# Patient Record
Sex: Female | Born: 1935
Health system: Southern US, Community
[De-identification: ages and names within clinical notes are randomized; demographics above are authoritative.]

## PROBLEM LIST (undated history)

## (undated) DIAGNOSIS — M810 Age-related osteoporosis without current pathological fracture: Secondary | ICD-10-CM

## (undated) DIAGNOSIS — M1990 Unspecified osteoarthritis, unspecified site: Secondary | ICD-10-CM

## (undated) DIAGNOSIS — R569 Unspecified convulsions: Secondary | ICD-10-CM

## (undated) DIAGNOSIS — H3530 Unspecified macular degeneration: Secondary | ICD-10-CM

## (undated) DIAGNOSIS — M329 Systemic lupus erythematosus, unspecified: Secondary | ICD-10-CM

## (undated) DIAGNOSIS — F329 Major depressive disorder, single episode, unspecified: Secondary | ICD-10-CM

## (undated) DIAGNOSIS — I10 Essential (primary) hypertension: Secondary | ICD-10-CM

## (undated) DIAGNOSIS — F32A Depression, unspecified: Secondary | ICD-10-CM

## (undated) DIAGNOSIS — E785 Hyperlipidemia, unspecified: Secondary | ICD-10-CM

## (undated) DIAGNOSIS — G4700 Insomnia, unspecified: Secondary | ICD-10-CM

## (undated) DIAGNOSIS — IMO0002 Reserved for concepts with insufficient information to code with codable children: Secondary | ICD-10-CM

## (undated) DIAGNOSIS — K219 Gastro-esophageal reflux disease without esophagitis: Secondary | ICD-10-CM

## (undated) DIAGNOSIS — R51 Headache: Secondary | ICD-10-CM

## (undated) DIAGNOSIS — Z973 Presence of spectacles and contact lenses: Secondary | ICD-10-CM

## (undated) DIAGNOSIS — F419 Anxiety disorder, unspecified: Secondary | ICD-10-CM

## (undated) DIAGNOSIS — I2699 Other pulmonary embolism without acute cor pulmonale: Secondary | ICD-10-CM

## (undated) HISTORY — DX: Unspecified osteoarthritis, unspecified site: M19.90

## (undated) HISTORY — DX: Hyperlipidemia, unspecified: E78.5

## (undated) HISTORY — DX: Systemic lupus erythematosus, unspecified: M32.9

## (undated) HISTORY — PX: EYE SURGERY: SHX253

## (undated) HISTORY — PX: OTHER SURGICAL HISTORY: SHX169

## (undated) HISTORY — DX: Age-related osteoporosis without current pathological fracture: M81.0

## (undated) HISTORY — DX: Reserved for concepts with insufficient information to code with codable children: IMO0002

## (undated) HISTORY — PX: DILITATION & CURRETTAGE/HYSTROSCOPY WITH ESSURE: SHX5573

## (undated) HISTORY — DX: Unspecified macular degeneration: H35.30

## (undated) HISTORY — DX: Headache: R51

## (undated) HISTORY — DX: Gastro-esophageal reflux disease without esophagitis: K21.9

## (undated) HISTORY — DX: Other pulmonary embolism without acute cor pulmonale: I26.99

## (undated) HISTORY — PX: TONSILLECTOMY: SUR1361

## (undated) HISTORY — DX: Essential (primary) hypertension: I10

## (undated) HISTORY — DX: Unspecified convulsions: R56.9

## (undated) HISTORY — PX: COLONOSCOPY: SHX174

## (undated) HISTORY — PX: APPENDECTOMY: SHX54

---

## 1998-09-06 HISTORY — PX: ANKLE FRACTURE SURGERY: SHX122

## 2006-09-06 DIAGNOSIS — I2699 Other pulmonary embolism without acute cor pulmonale: Secondary | ICD-10-CM

## 2006-09-06 HISTORY — PX: CARDIAC CATHETERIZATION: SHX172

## 2006-09-06 HISTORY — DX: Other pulmonary embolism without acute cor pulmonale: I26.99

## 2009-09-06 HISTORY — PX: WRIST SURGERY: SHX841

## 2012-02-05 DIAGNOSIS — M1990 Unspecified osteoarthritis, unspecified site: Secondary | ICD-10-CM

## 2012-02-05 DIAGNOSIS — H3530 Unspecified macular degeneration: Secondary | ICD-10-CM

## 2012-02-05 HISTORY — DX: Unspecified osteoarthritis, unspecified site: M19.90

## 2012-02-05 HISTORY — DX: Unspecified macular degeneration: H35.30

## 2012-02-15 ENCOUNTER — Ambulatory Visit (INDEPENDENT_AMBULATORY_CARE_PROVIDER_SITE_OTHER): Payer: Self-pay | Admitting: Psychiatry

## 2012-02-15 ENCOUNTER — Encounter (HOSPITAL_COMMUNITY): Payer: Self-pay | Admitting: Psychiatry

## 2012-02-15 VITALS — Wt 187.0 lb

## 2012-02-15 DIAGNOSIS — F329 Major depressive disorder, single episode, unspecified: Secondary | ICD-10-CM

## 2012-02-15 DIAGNOSIS — F3289 Other specified depressive episodes: Secondary | ICD-10-CM

## 2012-02-15 DIAGNOSIS — Z79899 Other long term (current) drug therapy: Secondary | ICD-10-CM

## 2012-02-15 LAB — CBC WITH DIFFERENTIAL/PLATELET
Basophils Relative: 0 % (ref 0–1)
Eosinophils Absolute: 0 10*3/uL (ref 0.0–0.7)
Eosinophils Relative: 0 % (ref 0–5)
HCT: 38.7 % (ref 36.0–46.0)
Hemoglobin: 13.3 g/dL (ref 12.0–15.0)
MCH: 29.6 pg (ref 26.0–34.0)
MCHC: 34.4 g/dL (ref 30.0–36.0)
MCV: 86 fL (ref 78.0–100.0)
Monocytes Absolute: 0.5 10*3/uL (ref 0.1–1.0)
Monocytes Relative: 7 % (ref 3–12)

## 2012-02-15 LAB — HEMOGLOBIN A1C: Mean Plasma Glucose: 117 mg/dL — ABNORMAL HIGH (ref <117)

## 2012-02-15 MED ORDER — DIAZEPAM 2 MG PO TABS: 2.0000 mg | ORAL_TABLET | Freq: Two times a day (BID) | ORAL | Status: DC | PRN

## 2012-02-15 MED ORDER — CITALOPRAM HYDROBROMIDE 40 MG PO TABS: 40.0000 mg | ORAL_TABLET | Freq: Every day | ORAL | Status: DC

## 2012-02-15 MED ORDER — TRAZODONE HCL 150 MG PO TABS: 150.0000 mg | ORAL_TABLET | Freq: Every day | ORAL | Status: DC

## 2012-02-15 MED ORDER — ARIPIPRAZOLE 5 MG PO TABS: 5.0000 mg | ORAL_TABLET | Freq: Every day | ORAL | Status: DC

## 2012-02-15 MED ORDER — TOPIRAMATE 25 MG PO TABS: 25.0000 mg | ORAL_TABLET | Freq: Every day | ORAL | Status: DC
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
## 2012-02-16 LAB — COMPREHENSIVE METABOLIC PANEL
Albumin: 4.3 g/dL (ref 3.5–5.2)
Alkaline Phosphatase: 87 U/L (ref 39–117)
BUN: 12 mg/dL (ref 6–23)
Glucose, Bld: 99 mg/dL (ref 70–99)
Potassium: 3.9 mEq/L (ref 3.5–5.3)

## 2012-02-29 ENCOUNTER — Ambulatory Visit (INDEPENDENT_AMBULATORY_CARE_PROVIDER_SITE_OTHER): Payer: Medicare Other | Admitting: Psychiatry

## 2012-02-29 ENCOUNTER — Encounter (HOSPITAL_COMMUNITY): Payer: Self-pay | Admitting: Psychiatry

## 2012-02-29 DIAGNOSIS — F329 Major depressive disorder, single episode, unspecified: Secondary | ICD-10-CM
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
## 2012-03-02 NOTE — Patient Instructions (Signed)
Discussed orally 

## 2012-03-07 ENCOUNTER — Ambulatory Visit (HOSPITAL_COMMUNITY): Payer: Self-pay | Admitting: Psychiatry

## 2012-03-13 ENCOUNTER — Ambulatory Visit (HOSPITAL_COMMUNITY): Payer: Self-pay | Admitting: Psychiatry

## 2012-03-14 ENCOUNTER — Ambulatory Visit (INDEPENDENT_AMBULATORY_CARE_PROVIDER_SITE_OTHER): Payer: Medicare Other | Admitting: Psychiatry

## 2012-03-14 ENCOUNTER — Encounter (HOSPITAL_COMMUNITY): Payer: Self-pay | Admitting: Psychiatry

## 2012-03-14 VITALS — Wt 187.0 lb

## 2012-03-14 DIAGNOSIS — F329 Major depressive disorder, single episode, unspecified: Secondary | ICD-10-CM

## 2012-03-14 MED ORDER — DIAZEPAM 2 MG PO TABS: 2.0000 mg | ORAL_TABLET | Freq: Two times a day (BID) | ORAL | Status: DC | PRN

## 2012-03-14 MED ORDER — TOPIRAMATE 25 MG PO TABS: ORAL_TABLET | ORAL | Status: DC
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
## 2012-03-15 ENCOUNTER — Ambulatory Visit (INDEPENDENT_AMBULATORY_CARE_PROVIDER_SITE_OTHER): Payer: Medicare Other | Admitting: Psychiatry

## 2012-03-15 DIAGNOSIS — F329 Major depressive disorder, single episode, unspecified: Secondary | ICD-10-CM

## 2012-03-16 ENCOUNTER — Other Ambulatory Visit (HOSPITAL_COMMUNITY): Payer: Self-pay | Admitting: Psychiatry

## 2012-03-17 ENCOUNTER — Other Ambulatory Visit (HOSPITAL_COMMUNITY): Payer: Self-pay | Admitting: Psychiatry

## 2012-03-17 ENCOUNTER — Telehealth (HOSPITAL_COMMUNITY): Payer: Self-pay | Admitting: *Deleted

## 2012-03-17 ENCOUNTER — Other Ambulatory Visit (HOSPITAL_COMMUNITY): Payer: Self-pay | Admitting: *Deleted

## 2012-03-17 DIAGNOSIS — F329 Major depressive disorder, single episode, unspecified: Secondary | ICD-10-CM

## 2012-03-17 NOTE — Progress Notes (Signed)
Patient:  Grace Howard   DOB: 04-10-1936  MR Number: [id number]  Location: [facility]:  [address] [place] [place],  [place], [address]  Start: Wednesday 03/15/2012 2:05 PM End: Wednesday 03/15/2012 2:55 PM  Provider/Observer:     Florencia Reasons, MSW, LCSW   Chief Complaint:      Chief Complaint  Patient presents with  . Depression    Reason For Service:    The patient was referred for services by psychiatrist Dr. Lolly Mustache to improve coping skills. Patient has a long-standing history of symptoms of depression beginning in 1976. She is seeking services to establish care. Patient reports increased stressors for the past year with her symptoms worsening in the past few months. She reports moving from [place] in June 2012 after being there 17 years to reside with her son and his family in [place]. Patient reports she made a financial investment in assisting son and his wife in adding an apartment to their home for patient. However, within a month of patient's arrival, her son and daughter-in-law separated and began the process to obtain a divorce. Patient reports moving to [place], her hometown, in January 2013. Patient reports having a very close friend in [place] and knowing several acquaintances. However, she reports isolating herself being a home body and watching TV. She reports failing to contact and see people due to being self-conscious about weight gain. She states being skinny until she was 76 years old. She also reports avoiding contact due to dreading people asking about her family and states she does not want this to be the talk of the town. Patient also states being embarrassed about her social skills and says she feels she does not have much to offer. Patient reports stress related to needing and obtaining an attorney to handle legal issues to try to get a return of her financial investment as her son's and daughter-in-law's lawyers negotiate the assets in the divorce. Patient also  reports grief and loss issues regarding the loss of the relationship with her daughter-in-law with whom she had been very close and considered as a daughter. Patient reports additional stress related to her health as she has several medical issues. Patient is seen for follow up appointment.   Interventions Strategy:  Supportive therapy  Participation Level:   Active  Participation Quality:  Appropriate      Behavioral Observation:  Well Groomed, Alert, and Appropriate.   Current Psychosocial Factors: Ongoing concerns regarding her son going through divorce  Content of Session:   Establishing rapport, reviewing symptoms, processing feelings  Current Status:   The patient reports anxiety and sadness.  Patient Progress:   Fair. The patient reports little to no change in her symptoms since last session. She has decreased isolative behaviors. She has been socializing more with 2 of her friends. Patient still is very self-conscious about her weight gain and her family situation and tends to avoid acquaintances. She expresses increased worry about her son who is going through a divorce. Per patient's report, she has not heard from her son since Mother's Day. She reports asking him if the house had been sold. However, he told her that he couldn't talk about that  and that she had an attorney. She reports trying to avoid the subject for the remainder of the conversation. She has attempted to contact son several times since that time. Her son has not responded. She reports that his father who lives near he him had been meeting with their son  regularly as he seemed to be suffering from depression. Patient is concerned about her son's welfare.  Target Goals:   Establishing rapport  Last Reviewed:     Goals Addressed Today:    Establishing rapport   Impression/Diagnosis:   Patient presents with a long-standing history of depression with symptoms beginning in 1976. She has had 2 psychiatric  hospitalizations due to to depression. Patient's symptoms have worsened in recent months precipitated by a move, patient's health issues, and concerns regarding her family. Current symptoms include depressed mood, anxiety, agitation, sleep difficulty, memory difficulty, loss of interest, excessive worrying, low energy, and poor concentration. Diagnosis: Major depressive disorder, recurrent, moderate   Diagnosis:  Axis I:  1. Major depressive disorder             Axis II: No diagnosis

## 2012-03-17 NOTE — Telephone Encounter (Signed)
Given hard copy on 02/15/12 for 90 days.

## 2012-03-17 NOTE — Patient Instructions (Signed)
Discussed orally 

## 2012-03-19 ENCOUNTER — Other Ambulatory Visit (HOSPITAL_COMMUNITY): Payer: Self-pay | Admitting: Psychiatry

## 2012-03-20 ENCOUNTER — Telehealth (HOSPITAL_COMMUNITY): Payer: Self-pay | Admitting: *Deleted

## 2012-03-20 ENCOUNTER — Other Ambulatory Visit (HOSPITAL_COMMUNITY): Payer: Self-pay | Admitting: Psychiatry

## 2012-03-20 DIAGNOSIS — F329 Major depressive disorder, single episode, unspecified: Secondary | ICD-10-CM

## 2012-03-20 NOTE — Telephone Encounter (Signed)
Patient does not remember what she did with her prescription for Celexa 90 day supply that you gave her on 02/15/12. She has 10 days left. She said she does not have it, not sure if she ever mailed it, and prescription was never received by mail order pharmacy.  Asked if we could call in a RX for Celexa to [facility] in [place].

## 2012-03-21 ENCOUNTER — Telehealth (HOSPITAL_COMMUNITY): Payer: Self-pay | Admitting: *Deleted

## 2012-03-21 ENCOUNTER — Other Ambulatory Visit (HOSPITAL_COMMUNITY): Payer: Self-pay | Admitting: *Deleted

## 2012-03-21 DIAGNOSIS — F329 Major depressive disorder, single episode, unspecified: Secondary | ICD-10-CM

## 2012-03-21 MED ORDER — CITALOPRAM HYDROBROMIDE 40 MG PO TABS: 40.0000 mg | ORAL_TABLET | Freq: Every day | ORAL | Status: DC

## 2012-03-21 NOTE — Telephone Encounter (Signed)
Patient cannot find RX from 02/15/12 printed and given to her at office visit. Never sent to [facility]. Will run out of med before appt 04/27/12. 30 day supply authorized by Dr.Arfeen

## 2012-03-21 NOTE — Telephone Encounter (Signed)
See telephone call notes

## 2012-03-30 ENCOUNTER — Ambulatory Visit (HOSPITAL_COMMUNITY): Payer: Self-pay | Admitting: Psychiatry

## 2012-04-10 ENCOUNTER — Other Ambulatory Visit (HOSPITAL_COMMUNITY): Payer: Self-pay | Admitting: *Deleted

## 2012-04-10 DIAGNOSIS — F329 Major depressive disorder, single episode, unspecified: Secondary | ICD-10-CM

## 2012-04-11 ENCOUNTER — Other Ambulatory Visit (HOSPITAL_COMMUNITY): Payer: Self-pay | Admitting: Psychiatry

## 2012-04-11 DIAGNOSIS — F329 Major depressive disorder, single episode, unspecified: Secondary | ICD-10-CM

## 2012-04-11 MED ORDER — TOPIRAMATE 25 MG PO TABS: ORAL_TABLET | ORAL | Status: DC

## 2012-04-13 ENCOUNTER — Ambulatory Visit (HOSPITAL_COMMUNITY): Payer: Self-pay | Admitting: Psychiatry

## 2012-04-19 ENCOUNTER — Ambulatory Visit (INDEPENDENT_AMBULATORY_CARE_PROVIDER_SITE_OTHER): Payer: Medicare Other | Admitting: Psychiatry

## 2012-04-19 DIAGNOSIS — F329 Major depressive disorder, single episode, unspecified: Secondary | ICD-10-CM
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
## 2012-04-21 NOTE — Patient Instructions (Signed)
Discussed orally 

## 2012-04-27 ENCOUNTER — Ambulatory Visit (INDEPENDENT_AMBULATORY_CARE_PROVIDER_SITE_OTHER): Payer: Medicare Other | Admitting: Psychiatry

## 2012-04-27 ENCOUNTER — Encounter (HOSPITAL_COMMUNITY): Payer: Self-pay | Admitting: Psychiatry

## 2012-04-27 VITALS — Wt 188.0 lb

## 2012-04-27 DIAGNOSIS — F3289 Other specified depressive episodes: Secondary | ICD-10-CM

## 2012-04-27 DIAGNOSIS — F329 Major depressive disorder, single episode, unspecified: Secondary | ICD-10-CM

## 2012-04-27 MED ORDER — DIAZEPAM 2 MG PO TABS: 2.0000 mg | ORAL_TABLET | Freq: Two times a day (BID) | ORAL | Status: DC | PRN

## 2012-04-27 MED ORDER — TRAZODONE HCL 150 MG PO TABS: 150.0000 mg | ORAL_TABLET | Freq: Every day | ORAL | Status: DC

## 2012-04-27 MED ORDER — TOPIRAMATE 25 MG PO TABS: 25.0000 mg | ORAL_TABLET | Freq: Every day | ORAL | Status: DC

## 2012-04-27 MED ORDER — ARIPIPRAZOLE 5 MG PO TABS: 5.0000 mg | ORAL_TABLET | Freq: Every day | ORAL | Status: DC

## 2012-04-27 MED ORDER — CITALOPRAM HYDROBROMIDE 40 MG PO TABS: 40.0000 mg | ORAL_TABLET | Freq: Every day | ORAL | Status: DC
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
## 2012-05-03 ENCOUNTER — Ambulatory Visit (HOSPITAL_COMMUNITY): Payer: Self-pay | Admitting: Psychiatry

## 2012-05-12 ENCOUNTER — Ambulatory Visit (INDEPENDENT_AMBULATORY_CARE_PROVIDER_SITE_OTHER): Payer: Medicare Other | Admitting: Psychiatry

## 2012-05-12 DIAGNOSIS — F329 Major depressive disorder, single episode, unspecified: Secondary | ICD-10-CM

## 2012-05-16 NOTE — Progress Notes (Signed)
Patient:  Grace Howard   DOB: 12/30/1935  MR Number: [id number]  Location: [facility]:  [address] [place] [place],  [place], [address]  Start: Friday 05/12/2012 1:00 PM End: Friday 05/12/2012 1:55 PM  Provider/Observer:     Florencia Reasons, MSW, LCSW   Chief Complaint:      Chief Complaint  Patient presents with  . Depression    Reason For Service:    The patient was referred for services by psychiatrist Dr. Lolly Mustache to improve coping skills. Patient has a long-standing history of symptoms of depression beginning in 1976. She is seeking services to establish care. Patient reports increased stressors for the past year with her symptoms worsening in the past few months. She reports moving from [place] in June 2012 after being there 17 years to reside with her son and his family in [place]. Patient reports she made a financial investment in assisting son and his wife in adding an apartment to their home for patient. However, within a month of patient's arrival, her son and daughter-in-law separated and began the process to obtain a divorce. Patient reports moving to [place], her hometown, in January 2013. Patient reports having a very close friend in [place] and knowing several acquaintances. However, she reports isolating herself being a home body and watching TV. She reports failing to contact and see people due to being self-conscious about weight gain. She states being skinny until she was 76 years old. She also reports avoiding contact due to dreading people asking about her family and states she does not want this to be the talk of the town. Patient also states being embarrassed about her social skills and says she feels she does not have much to offer. Patient reports stress related to needing and obtaining an attorney to handle legal issues to try to get a return of her financial investment as her son's and daughter-in-law's lawyers negotiate the assets in the divorce. Patient also reports  grief and loss issues regarding the loss of the relationship with her daughter-in-law with whom she had been very close and considered as a daughter. Patient reports additional stress related to her health as she has several medical issues. Patient is seen for follow up appointment.   Interventions Strategy:  Supportive therapy  Participation Level:   Active  Participation Quality:  Appropriate      Behavioral Observation:  Well Groomed, Alert, and Appropriate.   Current Psychosocial Factors: Financial issues related to her son's divorce, health concerns  Content of Session:    reviewing symptoms, processing feelings, discussing patient's spirituality  Current Status:   The patient reports depressed mood along with continued anxiety and worry.  Patient Progress:   Fair. The patient reports feeling overwhelmed regarding financial issues related to her son's divorce as well as her health issues. Per patient's report, her son and daughter-in-law sold their home for considerably less than their original asking price resulting in a significant reduction in the amount of money available to reimburse patient for the addition she purchased for their home. Patient expresses sadness and disappointment as she had anticipated more money to make a down payment to purchase her own home. She also is concerned about the effects on her son. Patient also reports there is a large cyst on her uterus and expresses concern about having a transvaginal ultrasound in November. Patient has had increased thoughts about spirituality and reports fears of being alone and dying. She states wanting to go back to church but having  a fear of God and fear of what will happen to her after death. Therapist works with patient to explore possible resources in patient's community such as a Systems analyst that may be able to assist patient in answering her theological questions. She expresses reluctance due to a past  unhelpful response from a pastor when she was younger.  Target Goals:   Improving mood, reducing anxiety  Last Reviewed:     Goals Addressed Today:    Improving mood, reducing anxiety   Impression/Diagnosis:   Patient presents with a long-standing history of depression with symptoms beginning in 1976. She has had 2 psychiatric hospitalizations due to to depression. Patient's symptoms have worsened in recent months precipitated by a move, patient's health issues, and concerns regarding her family. Current symptoms include depressed mood, anxiety, agitation, sleep difficulty, memory difficulty, loss of interest, excessive worrying, low energy, and poor concentration. Diagnosis: Major depressive disorder, recurrent, moderate   Diagnosis:  Axis I:  1. Major depressive disorder             Axis II: No diagnosis

## 2012-05-16 NOTE — Patient Instructions (Signed)
Discussed orally 

## 2012-05-26 ENCOUNTER — Ambulatory Visit (INDEPENDENT_AMBULATORY_CARE_PROVIDER_SITE_OTHER): Payer: Medicare Other | Admitting: Psychiatry

## 2012-05-26 DIAGNOSIS — F329 Major depressive disorder, single episode, unspecified: Secondary | ICD-10-CM

## 2012-05-29 NOTE — Patient Instructions (Signed)
Discussed orally 

## 2012-05-29 NOTE — Progress Notes (Signed)
Patient:  Grace Howard   DOB: 10/06/1935  MR Number: [id number]  Location: [facility]:  [address] [place] [place],  [place], [address]  Start: Friday 05/26/2012 1:00 PM End: Friday 05/26/2012 1:50 PM  Provider/Observer:     Florencia Reasons, MSW, LCSW   Chief Complaint:      Chief Complaint  Patient presents with  . Depression    Reason For Service:    The patient was referred for services by psychiatrist Dr. Lolly Mustache to improve coping skills. Patient has a long-standing history of symptoms of depression beginning in 1976. She is seeking services to establish care. Patient reports increased stressors for the past year with her symptoms worsening in the past few months. She reports moving from [place] in June 2012 after being there 17 years to reside with her son and his family in [place]. Patient reports she made a financial investment in assisting son and his wife in adding an apartment to their home for patient. However, within a month of patient's arrival, her son and daughter-in-law separated and began the process to obtain a divorce. Patient reports moving to [place], her hometown, in January 2013. Patient reports having a very close friend in [place] and knowing several acquaintances. However, she reports isolating herself being a home body and watching TV. She reports failing to contact and see people due to being self-conscious about weight gain. She states being skinny until she was 76 years old. She also reports avoiding contact due to dreading people asking about her family and states she does not want this to be the talk of the town. Patient also states being embarrassed about her social skills and says she feels she does not have much to offer. Patient reports stress related to needing and obtaining an attorney to handle legal issues to try to get a return of her financial investment as her son's and daughter-in-law's lawyers negotiate the assets in the divorce. Patient also reports  grief and loss issues regarding the loss of the relationship with her daughter-in-law with whom she had been very close and considered as a daughter. Patient reports additional stress related to her health as she has several medical issues. Patient is seen for follow up appointment.   Interventions Strategy:  Supportive therapy  Participation Level:   Active  Participation Quality:  Appropriate      Behavioral Observation:  Well Groomed, Alert, and Appropriate.   Current Psychosocial Factors: Financial issues related to her son's divorce, health concerns  Content of Session:    reviewing symptoms, processing feelings, discussing patient's spirituality, reinforcing patient's efforts to set and maintain boundaries with her son  Current Status:   The patient reports less depressed mood but  continued anxiety and worry.  Patient Progress:   Fair. The patient reports increased stress as she has begun the process for applying for a loan to buy the town house where she currently is residing. She expresses frustrationthat her son has not returned her calls regarding her messages to reach a financial settlement regarding patient's reimbursement for the addition she purchased for his and his wife's home. However, patient has set boundaries by setting a deadline as to when she will involve her attorney. Patient has made progress and socializing more with diffuse alert friends but continues to experience anxiety about involvement with other people as well as attending church. She reports she has not yet contacted a Education officer, environmental or Librarian, academic. She still expresses a strong desire to go to church. Therapist  works with patient to explore small steps patient could possibly take to attend church.   Target Goals:   Improving mood, reducing anxiety  Last Reviewed:     Goals Addressed Today:    Improving mood, reducing anxiety   Impression/Diagnosis:   Patient presents with a long-standing history of  depression with symptoms beginning in 1976. She has had 2 psychiatric hospitalizations due to to depression. Patient's symptoms have worsened in recent months precipitated by a move, patient's health issues, and concerns regarding her family. Current symptoms include depressed mood, anxiety, agitation, sleep difficulty, memory difficulty, loss of interest, excessive worrying, low energy, and poor concentration. Diagnosis: Major depressive disorder, recurrent, moderate   Diagnosis:  Axis I:  1. Major depressive disorder             Axis II: No diagnosis

## 2012-06-09 ENCOUNTER — Ambulatory Visit (INDEPENDENT_AMBULATORY_CARE_PROVIDER_SITE_OTHER): Payer: Medicare Other | Admitting: Psychiatry

## 2012-06-09 DIAGNOSIS — F329 Major depressive disorder, single episode, unspecified: Secondary | ICD-10-CM

## 2012-06-09 NOTE — Patient Instructions (Signed)
Discussed orally 

## 2012-06-09 NOTE — Progress Notes (Addendum)
Patient:  Grace Howard   DOB: 1936/08/15  MR Number: [id number]  Location: [facility]:  [address] [place],  [place], [address]  Start: Friday 06/09/2012 3:05 PM End: Friday 06/09/2012 3:55 PM  Provider/Observer:     Florencia Reasons, MSW, LCSW   Chief Complaint:      Chief Complaint  Patient presents with  . Depression    Reason For Service:    The patient was referred for services by psychiatrist Dr. Lolly Mustache to improve coping skills. Patient has a long-standing history of symptoms of depression beginning in 1976. She is seeking services to establish care. Patient reports increased stressors for the past year with her symptoms worsening in the past few months. She reports moving from [place] in June 2012 after being there 17 years to reside with her son and his family in [place]. Patient reports she made a financial investment in assisting son and his wife in adding an apartment to their home for patient. However, within a month of patient's arrival, her son and daughter-in-law separated and began the process to obtain a divorce. Patient reports moving to [place], her hometown, in January 2013. Patient reports having a very close friend in [place] and knowing several acquaintances. However, she reports isolating herself being a home body and watching TV. She reports failing to contact and see people due to being self-conscious about weight gain. She states being skinny until she was 76 years old. She also reports avoiding contact due to dreading people asking about her family and states she does not want this to be the talk of the town. Patient also states being embarrassed about her social skills and says she feels she does not have much to offer. Patient reports stress related to needing and obtaining an attorney to handle legal issues to try to get a return of her financial investment as her son's and daughter-in-law's lawyers negotiate the assets in the divorce. Patient also reports  grief and loss issues regarding the loss of the relationship with her daughter-in-law with whom she had been very close and considered as a daughter. Patient reports additional stress related to her health as she has several medical issues. Patient is seen for follow up appointment.   Interventions Strategy:  Supportive therapy  Participation Level:   Active  Participation Quality:  Appropriate      Behavioral Observation:  Well Groomed, Alert, and Appropriate.   Current Psychosocial Factors:   Content of Session:    reviewing symptoms, processing feelings, reinforcing patient's efforts to increase socialization and involvement in activity  Current Status:   The patient reports improved mood, decreased worry, and decreased anxiety.  Patient Progress:   Good. The patient reports decreased stress regarding the loan application process as she has received positive feedback from potential mortgage lenders and is working with a Firefighter regarding details and best options. She also is pleased that she and her son talked recently and that the conversation was very pleasant. Patient is thankful that her son seems to be adjusting fairly well. She continues to increase her social involvement and reports going on a couple of outings including lunch with a few friends. She also is excited about going on an upcoming 2 day trip with another friend to see patient's former college roommate. Patient also is looking forward to attending upcoming shows in [place].  She expresses less anxiety regarding being involved with the group formed to participate in these outings which will include having dinner before  each show.  Target Goals:   Improving mood, reducing anxiety  Last Reviewed:     Goals Addressed Today:    Improving mood, reducing anxiety   Impression/Diagnosis:   Patient presents with a long-standing history of depression with symptoms beginning in 1976. She has had 2 psychiatric  hospitalizations due to to depression. Patient's symptoms have worsened in recent months precipitated by a move, patient's health issues, and concerns regarding her family. Current symptoms include depressed mood, anxiety, agitation, sleep difficulty, memory difficulty, loss of interest, excessive worrying, low energy, and poor concentration. Diagnosis: Major depressive disorder, recurrent, moderate   Diagnosis:  Axis I:  1. Major depressive disorder             Axis II: No diagnosis

## 2012-06-20 ENCOUNTER — Other Ambulatory Visit (HOSPITAL_COMMUNITY): Payer: Self-pay | Admitting: *Deleted

## 2012-06-20 DIAGNOSIS — F329 Major depressive disorder, single episode, unspecified: Secondary | ICD-10-CM

## 2012-06-21 ENCOUNTER — Other Ambulatory Visit (HOSPITAL_COMMUNITY): Payer: Self-pay | Admitting: Psychiatry

## 2012-06-21 ENCOUNTER — Telehealth (HOSPITAL_COMMUNITY): Payer: Self-pay | Admitting: *Deleted

## 2012-06-22 ENCOUNTER — Other Ambulatory Visit (HOSPITAL_COMMUNITY): Payer: Self-pay | Admitting: Psychiatry

## 2012-06-22 DIAGNOSIS — F329 Major depressive disorder, single episode, unspecified: Secondary | ICD-10-CM

## 2012-06-22 MED ORDER — ARIPIPRAZOLE 2 MG PO TABS: ORAL_TABLET | ORAL | Status: DC

## 2012-06-27 ENCOUNTER — Encounter (HOSPITAL_COMMUNITY): Payer: Self-pay | Admitting: Psychiatry

## 2012-06-27 ENCOUNTER — Ambulatory Visit (INDEPENDENT_AMBULATORY_CARE_PROVIDER_SITE_OTHER): Payer: Medicare Other | Admitting: Psychiatry

## 2012-06-27 ENCOUNTER — Ambulatory Visit (HOSPITAL_COMMUNITY): Payer: Self-pay | Admitting: Psychiatry

## 2012-06-27 DIAGNOSIS — F329 Major depressive disorder, single episode, unspecified: Secondary | ICD-10-CM

## 2012-06-27 MED ORDER — CITALOPRAM HYDROBROMIDE 40 MG PO TABS: 40.0000 mg | ORAL_TABLET | Freq: Every day | ORAL | Status: DC

## 2012-06-27 MED ORDER — TRAZODONE HCL 150 MG PO TABS: 150.0000 mg | ORAL_TABLET | Freq: Every day | ORAL | Status: DC

## 2012-06-27 MED ORDER — TOPIRAMATE 25 MG PO TABS: 25.0000 mg | ORAL_TABLET | Freq: Every day | ORAL | Status: DC

## 2012-06-27 MED ORDER — DIAZEPAM 2 MG PO TABS: 2.0000 mg | ORAL_TABLET | Freq: Two times a day (BID) | ORAL | Status: DC | PRN
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
## 2012-06-30 ENCOUNTER — Ambulatory Visit (INDEPENDENT_AMBULATORY_CARE_PROVIDER_SITE_OTHER): Payer: Medicare Other | Admitting: Psychiatry

## 2012-06-30 DIAGNOSIS — F329 Major depressive disorder, single episode, unspecified: Secondary | ICD-10-CM

## 2012-06-30 NOTE — Progress Notes (Signed)
Patient:  Grace Howard   DOB: [birth year]  MR Number: [id number]  Location: Behavioral Health Center:  [address] [place] [place],  [place], [address]  Start: Friday 06/30/2012 2:00 PM End: Friday 06/30/2012 2:45 PM  Provider/Observer:     Florencia Reasons, MSW, LCSW   Chief Complaint:      Chief Complaint  Patient presents with  . Depression    Reason For Service:    The patient was referred for services by psychiatrist Dr. Lolly Mustache to improve coping skills. Patient has a long-standing history of symptoms of depression beginning in 1976. She is seeking services to establish care. Patient reports increased stressors for the past year with her symptoms worsening in the past few months. She reports moving from [place] in June 2012 after being there 17 years to reside with her son and his family in [place]. Patient reports she made a financial investment in assisting son and his wife in adding an apartment to their home for patient. However, within a month of patient's arrival, her son and daughter-in-law separated and began the process to obtain a divorce. Patient reports moving to [place], her hometown, in January 2013. Patient reports having a very close friend in [place] and knowing several acquaintances. However, she reports isolating herself being a home body and watching TV. She reports failing to contact and see people due to being self-conscious about weight gain. She states being skinny until she was 76 years old. She also reports avoiding contact due to dreading people asking about her family and states she does not want this to be the talk of the town. Patient also states being embarrassed about her social skills and says she feels she does not have much to offer. Patient reports stress related to needing and obtaining an attorney to handle legal issues to try to get a return of her financial investment as her son's and daughter-in-law's lawyers negotiate the assets in the divorce. Patient also  reports grief and loss issues regarding the loss of the relationship with her daughter-in-law with whom she had been very close and considered as a daughter. Patient reports additional stress related to her health as she has several medical issues. Patient is seen for follow up appointment.   Interventions Strategy:  Supportive therapy  Participation Level:   Active  Participation Quality:  Appropriate      Behavioral Observation:  Well Groomed, Alert, and Appropriate, pleasant  Current Psychosocial Factors:   Content of Session:    reviewing symptoms, processing feelings, discussing patient's progress, reinforcing patient's efforts to increase activity and social involvement  Current Status:   The patient reports improved mood, decreased worry, and decreased anxiety.  Patient Progress:   Good. The patient reports continued improved mood and increased activity and social involvement. She reports enjoying a recent trip to see her college roommate. She also is looking forward to attending upcoming shows in [place]. She expresses less worry about her loan application as well as less worry about her sons. Her statements in session reflect increased optimism and hope. She also expresses less anxiety about an upcoming medical procedure.  Target Goals:   Improving mood, reducing anxiety  Last Reviewed:     Goals Addressed Today:    Improving mood, reducing anxiety   Impression/Diagnosis:   Patient presents with a long-standing history of depression with symptoms beginning in 1976. She has had 2 psychiatric hospitalizations due to to depression. Patient's symptoms have worsened in recent months precipitated by a move, patient's health  issues, and concerns regarding her family. Current symptoms include depressed mood, anxiety, agitation, sleep difficulty, memory difficulty, loss of interest, excessive worrying, low energy, and poor concentration. Diagnosis: Major depressive disorder, recurrent,  moderate   Diagnosis:  Axis I:  1. Major depressive disorder             Axis II: No diagnosis

## 2012-06-30 NOTE — Patient Instructions (Signed)
Discussed orally 

## 2012-08-08 ENCOUNTER — Other Ambulatory Visit (HOSPITAL_COMMUNITY): Payer: Self-pay | Admitting: *Deleted

## 2012-08-08 DIAGNOSIS — F329 Major depressive disorder, single episode, unspecified: Secondary | ICD-10-CM

## 2012-08-08 MED ORDER — TOPIRAMATE 25 MG PO TABS: 25.0000 mg | ORAL_TABLET | Freq: Every day | ORAL | Status: DC

## 2012-08-08 MED ORDER — DIAZEPAM 2 MG PO TABS: 2.0000 mg | ORAL_TABLET | Freq: Two times a day (BID) | ORAL | Status: DC | PRN

## 2012-08-08 MED ORDER — CITALOPRAM HYDROBROMIDE 40 MG PO TABS: 40.0000 mg | ORAL_TABLET | Freq: Every day | ORAL | Status: DC

## 2012-08-08 MED ORDER — TRAZODONE HCL 150 MG PO TABS: 150.0000 mg | ORAL_TABLET | Freq: Every day | ORAL | Status: DC

## 2012-08-25 ENCOUNTER — Ambulatory Visit (HOSPITAL_COMMUNITY): Payer: Self-pay | Admitting: Psychiatry

## 2012-09-15 ENCOUNTER — Ambulatory Visit (HOSPITAL_COMMUNITY): Payer: Self-pay | Admitting: Psychiatry

## 2012-09-27 ENCOUNTER — Ambulatory Visit (HOSPITAL_COMMUNITY): Payer: Self-pay | Admitting: Psychiatry

## 2012-09-29 ENCOUNTER — Telehealth (HOSPITAL_COMMUNITY): Payer: Self-pay | Admitting: Psychiatry

## 2012-10-04 ENCOUNTER — Other Ambulatory Visit (HOSPITAL_COMMUNITY): Payer: Self-pay | Admitting: *Deleted

## 2012-10-04 DIAGNOSIS — F329 Major depressive disorder, single episode, unspecified: Secondary | ICD-10-CM

## 2012-10-04 NOTE — Telephone Encounter (Signed)
Refill request sent to Dr.Arfeen in Dr.Walker's absence

## 2012-10-05 MED ORDER — DIAZEPAM 2 MG PO TABS: 2.0000 mg | ORAL_TABLET | Freq: Two times a day (BID) | ORAL | Status: DC | PRN

## 2012-10-05 NOTE — Telephone Encounter (Signed)
Dr.Arfeen authorized a 10 day supply of Diazepam for pt in Dr.Walker's absence

## 2012-10-09 NOTE — Telephone Encounter (Signed)
Pt never seen by me, meds renewed until her appointment in 7 days.

## 2012-10-16 ENCOUNTER — Encounter (HOSPITAL_COMMUNITY): Payer: Self-pay | Admitting: Psychiatry

## 2012-10-16 ENCOUNTER — Ambulatory Visit (INDEPENDENT_AMBULATORY_CARE_PROVIDER_SITE_OTHER): Payer: Medicare Other | Admitting: Psychiatry

## 2012-10-16 VITALS — Wt 195.8 lb

## 2012-10-16 DIAGNOSIS — G894 Chronic pain syndrome: Secondary | ICD-10-CM | POA: Insufficient documentation

## 2012-10-16 DIAGNOSIS — F329 Major depressive disorder, single episode, unspecified: Secondary | ICD-10-CM

## 2012-10-16 DIAGNOSIS — E559 Vitamin D deficiency, unspecified: Secondary | ICD-10-CM

## 2012-10-16 MED ORDER — DIAZEPAM 2 MG PO TABS: 2.0000 mg | ORAL_TABLET | Freq: Two times a day (BID) | ORAL | Status: DC | PRN

## 2012-10-16 MED ORDER — GABAPENTIN 100 MG PO CAPS: 100.0000 mg | ORAL_CAPSULE | Freq: Three times a day (TID) | ORAL | Status: DC

## 2012-10-16 MED ORDER — ARIPIPRAZOLE 2 MG PO TABS: ORAL_TABLET | ORAL | Status: DC

## 2012-10-16 MED ORDER — LIDOCAINE 5 % EX PTCH: 1.0000 | MEDICATED_PATCH | Freq: Two times a day (BID) | CUTANEOUS | Status: DC
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
## 2012-10-16 NOTE — Patient Instructions (Addendum)
Cut the Trazodone in 1/2 for about a week and then stop.  Stop the Topamax cut in half for 2 or 3 days.  Try shifting to Neurontin in place of the Valium.  Masy take up to 3 of the Neurontin at a time to help with pain and anxiety.  The Celexa is half as strong as before.  Relaxation is the ultimate solution for you.  You can seek it through tub baths, bubble baths, essential oils or incense, walking or chatting with friends, listening to soft music, watching a candle burn and just letting all thoughts go and appreciating the true essence of the Creator.   Call if problems or concerns.

## 2012-10-17 ENCOUNTER — Telehealth (HOSPITAL_COMMUNITY): Payer: Self-pay | Admitting: Psychiatry

## 2012-10-17 LAB — VITAMIN D 25 HYDROXY (VIT D DEFICIENCY, FRACTURES): Vit D, 25-Hydroxy: 46 ng/mL (ref 30–89)

## 2012-10-17 NOTE — Telephone Encounter (Signed)
Will await the decision. S/W pt and verified that the Voltaren did not work too well.

## 2012-10-21 ENCOUNTER — Other Ambulatory Visit: Payer: Self-pay

## 2012-10-23 ENCOUNTER — Telehealth (HOSPITAL_COMMUNITY): Payer: Self-pay | Admitting: Psychiatry

## 2012-10-23 NOTE — Telephone Encounter (Signed)
Lidoderm patch authorized form 10-16-2012 to 10-19-2013

## 2012-10-26 ENCOUNTER — Telehealth (HOSPITAL_COMMUNITY): Payer: Self-pay | Admitting: Psychiatry

## 2012-11-08 NOTE — Telephone Encounter (Signed)
Phone message completed in the phone message section.  

## 2012-11-13 ENCOUNTER — Ambulatory Visit (HOSPITAL_COMMUNITY): Payer: Self-pay | Admitting: Psychiatry

## 2012-11-13 ENCOUNTER — Telehealth (HOSPITAL_COMMUNITY): Payer: Self-pay | Admitting: Psychiatry

## 2012-11-13 DIAGNOSIS — F329 Major depressive disorder, single episode, unspecified: Secondary | ICD-10-CM

## 2012-11-13 MED ORDER — DIAZEPAM 2 MG PO TABS: 2.0000 mg | ORAL_TABLET | Freq: Two times a day (BID) | ORAL | Status: DC | PRN

## 2012-11-13 NOTE — Telephone Encounter (Signed)
Phone message completed in the phone message section.  

## 2012-11-29 ENCOUNTER — Ambulatory Visit (HOSPITAL_COMMUNITY): Payer: Self-pay | Admitting: Psychiatry

## 2012-12-06 ENCOUNTER — Ambulatory Visit (INDEPENDENT_AMBULATORY_CARE_PROVIDER_SITE_OTHER): Payer: Medicare Other | Admitting: Psychiatry

## 2012-12-06 ENCOUNTER — Encounter (HOSPITAL_COMMUNITY): Payer: Self-pay | Admitting: Psychiatry

## 2012-12-06 ENCOUNTER — Telehealth (HOSPITAL_COMMUNITY): Payer: Self-pay | Admitting: Psychiatry

## 2012-12-06 VITALS — Wt 199.9 lb

## 2012-12-06 DIAGNOSIS — G894 Chronic pain syndrome: Secondary | ICD-10-CM

## 2012-12-06 DIAGNOSIS — F329 Major depressive disorder, single episode, unspecified: Secondary | ICD-10-CM

## 2012-12-06 DIAGNOSIS — F5105 Insomnia due to other mental disorder: Secondary | ICD-10-CM

## 2012-12-06 DIAGNOSIS — E559 Vitamin D deficiency, unspecified: Secondary | ICD-10-CM

## 2012-12-06 DIAGNOSIS — F418 Other specified anxiety disorders: Secondary | ICD-10-CM | POA: Insufficient documentation

## 2012-12-06 MED ORDER — CITALOPRAM HYDROBROMIDE 40 MG PO TABS: 20.0000 mg | ORAL_TABLET | Freq: Every day | ORAL | Status: DC

## 2012-12-06 MED ORDER — ARIPIPRAZOLE 5 MG PO TABS: ORAL_TABLET | ORAL | Status: DC

## 2012-12-06 MED ORDER — TRAZODONE HCL 150 MG PO TABS: 75.0000 mg | ORAL_TABLET | Freq: Every day | ORAL | Status: DC

## 2012-12-06 MED ORDER — CITALOPRAM HYDROBROMIDE 40 MG PO TABS: 40.0000 mg | ORAL_TABLET | Freq: Every day | ORAL | Status: DC

## 2012-12-06 MED ORDER — DIAZEPAM 2 MG PO TABS: 2.0000 mg | ORAL_TABLET | Freq: Two times a day (BID) | ORAL | Status: DC | PRN

## 2012-12-06 MED ORDER — GABAPENTIN 100 MG PO CAPS: 100.0000 mg | ORAL_CAPSULE | Freq: Three times a day (TID) | ORAL | Status: DC
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
## 2012-12-06 NOTE — Telephone Encounter (Signed)
Pt wondered if she should keep on the 20 mg of Celexa.  That seems to be working better.  Will have her do that.

## 2012-12-06 NOTE — Patient Instructions (Signed)
Set a timer for 8 minutes and walk for that amount of time in the house or in the yard.  Mark "8" on a calendar for that day.  Do that every day this week.  Then next week increase the time to 9 minutes and then mark the calendar with a 9 for that day.  Each week increase your exercise by one minute.  Keep a record of this so you can see what progress you are making.  Do this every day, just like eating and sleeping.  It is good for pain control, depression, and for your soul/spirit.  Bring the record in for your next visit so we can talk about your effort and how you feel with the new exercise program going and working for you.  Relaxation is the ultimate solution for you.  You can seek it through tub baths, bubble baths, essential oils or incense, walking or chatting with friends, listening to soft music, watching a candle burn and just letting all thoughts go and appreciating the true essence of the Creator.  Pets or animals may be very helpful.  You might spend some time with them and then go do more directed meditation.  Call if problems or concerns.

## 2012-12-20 ENCOUNTER — Telehealth (HOSPITAL_COMMUNITY): Payer: Self-pay | Admitting: Psychiatry

## 2012-12-20 DIAGNOSIS — F329 Major depressive disorder, single episode, unspecified: Secondary | ICD-10-CM

## 2012-12-20 MED ORDER — ARIPIPRAZOLE 5 MG PO TABS: ORAL_TABLET | ORAL | Status: DC

## 2012-12-20 NOTE — Telephone Encounter (Signed)
Resent script to [facility]

## 2013-01-03 ENCOUNTER — Encounter (HOSPITAL_COMMUNITY): Payer: Self-pay | Admitting: Psychiatry

## 2013-01-03 ENCOUNTER — Ambulatory Visit (INDEPENDENT_AMBULATORY_CARE_PROVIDER_SITE_OTHER): Payer: Medicare Other | Admitting: Psychiatry

## 2013-01-03 VITALS — BP 130/81 | HR 75 | Ht 65.25 in | Wt 199.6 lb

## 2013-01-03 DIAGNOSIS — E559 Vitamin D deficiency, unspecified: Secondary | ICD-10-CM

## 2013-01-03 DIAGNOSIS — F418 Other specified anxiety disorders: Secondary | ICD-10-CM

## 2013-01-03 DIAGNOSIS — F329 Major depressive disorder, single episode, unspecified: Secondary | ICD-10-CM

## 2013-01-03 DIAGNOSIS — G894 Chronic pain syndrome: Secondary | ICD-10-CM

## 2013-01-03 MED ORDER — ARIPIPRAZOLE 5 MG PO TABS: ORAL_TABLET | ORAL | Status: DC

## 2013-01-03 MED ORDER — DIAZEPAM 2 MG PO TABS: 2.0000 mg | ORAL_TABLET | Freq: Two times a day (BID) | ORAL | Status: DC | PRN

## 2013-01-03 MED ORDER — TRAZODONE HCL 150 MG PO TABS: 75.0000 mg | ORAL_TABLET | Freq: Every day | ORAL | Status: DC

## 2013-01-03 MED ORDER — GABAPENTIN 100 MG PO CAPS: 100.0000 mg | ORAL_CAPSULE | Freq: Three times a day (TID) | ORAL | Status: DC

## 2013-01-03 MED ORDER — CITALOPRAM HYDROBROMIDE 20 MG PO TABS: 20.0000 mg | ORAL_TABLET | Freq: Every day | ORAL | Status: DC
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
## 2013-01-03 NOTE — Patient Instructions (Signed)
Set a timer for 8 or a certain number minutes and walk or some exercise sitting down for that amount of time in the house or in the yard.  Mark the number of minutes on a calendar for that day.  Do that every day this week.  Then next week increase the time by 1 minutes and then mark the calendar with the number of minutes for that day.  Each week increase your exercise by one minute.  Keep a record of this so you can see the progress you are making.  Do this every day, just like eating and sleeping.  It is good for pain control, depression, and for your soul/spirit.  Bring the record in for your next visit so we can talk about your effort and how you feel with the new exercise program going and working for you.  Take care of yourself.  No one else is standing up to do the job and only you know what you need.   GET SERIOUS about taking care of yourself.  Do the next right thing and that often means doing something to care for yourself along the lines of are you hungry, are you angry, are you lonely, are you tired, are you scared?  [employer] is what that stands for.  Call if problems or concerns.

## 2013-01-24 ENCOUNTER — Telehealth (HOSPITAL_COMMUNITY): Payer: Self-pay | Admitting: Psychiatry

## 2013-01-24 MED ORDER — QUETIAPINE FUMARATE 25 MG PO TABS: 25.0000 mg | ORAL_TABLET | Freq: Two times a day (BID) | ORAL | Status: DC

## 2013-01-24 NOTE — Telephone Encounter (Signed)
Pt is in the donut hole with her Medicare coverage.  The abilify is $1100 for 90 day supply,  Insurance company suggests Seroquel in its stead.  Will order and see if that works.

## 2013-03-05 ENCOUNTER — Ambulatory Visit (HOSPITAL_COMMUNITY): Payer: Self-pay | Admitting: Psychiatry

## 2013-03-08 ENCOUNTER — Ambulatory Visit (HOSPITAL_COMMUNITY): Payer: Self-pay | Admitting: Psychiatry

## 2013-03-29 ENCOUNTER — Ambulatory Visit (INDEPENDENT_AMBULATORY_CARE_PROVIDER_SITE_OTHER): Payer: Medicare Other | Admitting: Psychiatry

## 2013-03-29 ENCOUNTER — Encounter (HOSPITAL_COMMUNITY): Payer: Self-pay | Admitting: Psychiatry

## 2013-03-29 VITALS — Wt 194.5 lb

## 2013-03-29 DIAGNOSIS — F5105 Insomnia due to other mental disorder: Secondary | ICD-10-CM

## 2013-03-29 DIAGNOSIS — F329 Major depressive disorder, single episode, unspecified: Secondary | ICD-10-CM

## 2013-03-29 MED ORDER — TRAZODONE HCL 150 MG PO TABS: 150.0000 mg | ORAL_TABLET | Freq: Every day | ORAL | Status: DC

## 2013-03-29 MED ORDER — CITALOPRAM HYDROBROMIDE 40 MG PO TABS: 40.0000 mg | ORAL_TABLET | Freq: Every day | ORAL | Status: DC

## 2013-03-29 MED ORDER — DIAZEPAM 2 MG PO TABS: 2.0000 mg | ORAL_TABLET | Freq: Every day | ORAL | Status: DC | PRN
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
## 2013-04-05 ENCOUNTER — Ambulatory Visit (HOSPITAL_COMMUNITY): Payer: Self-pay | Admitting: Psychiatry

## 2013-04-11 ENCOUNTER — Other Ambulatory Visit: Payer: Self-pay

## 2013-05-09 ENCOUNTER — Ambulatory Visit (INDEPENDENT_AMBULATORY_CARE_PROVIDER_SITE_OTHER): Payer: Medicare Other | Admitting: Psychiatry

## 2013-05-09 ENCOUNTER — Encounter (HOSPITAL_COMMUNITY): Payer: Self-pay | Admitting: Psychiatry

## 2013-05-09 VITALS — BP 150/80 | Ht 65.0 in | Wt 197.0 lb

## 2013-05-09 DIAGNOSIS — F489 Nonpsychotic mental disorder, unspecified: Secondary | ICD-10-CM

## 2013-05-09 DIAGNOSIS — F329 Major depressive disorder, single episode, unspecified: Secondary | ICD-10-CM

## 2013-05-09 DIAGNOSIS — F341 Dysthymic disorder: Secondary | ICD-10-CM

## 2013-05-09 DIAGNOSIS — F418 Other specified anxiety disorders: Secondary | ICD-10-CM

## 2013-05-09 MED ORDER — ARIPIPRAZOLE 5 MG PO TABS: ORAL_TABLET | ORAL | Status: DC

## 2013-05-09 MED ORDER — CITALOPRAM HYDROBROMIDE 40 MG PO TABS: 40.0000 mg | ORAL_TABLET | Freq: Every day | ORAL | Status: DC

## 2013-05-09 MED ORDER — TRAZODONE HCL 150 MG PO TABS: 150.0000 mg | ORAL_TABLET | Freq: Every day | ORAL | Status: DC

## 2013-05-09 MED ORDER — DIAZEPAM 2 MG PO TABS: 2.0000 mg | ORAL_TABLET | Freq: Three times a day (TID) | ORAL | Status: DC
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
## 2013-06-04 ENCOUNTER — Telehealth (HOSPITAL_COMMUNITY): Payer: Self-pay | Admitting: Psychiatry

## 2013-06-04 NOTE — Telephone Encounter (Signed)
Only have samples of 2 mg

## 2013-06-13 ENCOUNTER — Telehealth (HOSPITAL_COMMUNITY): Payer: Self-pay | Admitting: Psychiatry

## 2013-06-13 ENCOUNTER — Other Ambulatory Visit (HOSPITAL_COMMUNITY): Payer: Self-pay | Admitting: Psychiatry

## 2013-06-13 MED ORDER — TOPIRAMATE 25 MG PO TABS: 25.0000 mg | ORAL_TABLET | Freq: Every day | ORAL | Status: DC

## 2013-06-13 NOTE — Telephone Encounter (Signed)
done

## 2013-06-15 ENCOUNTER — Other Ambulatory Visit (HOSPITAL_COMMUNITY): Payer: Self-pay | Admitting: Psychiatry

## 2013-06-15 ENCOUNTER — Telehealth (HOSPITAL_COMMUNITY): Payer: Self-pay | Admitting: Psychiatry

## 2013-06-15 MED ORDER — TOPIRAMATE 25 MG PO TABS: 25.0000 mg | ORAL_TABLET | Freq: Every day | ORAL | Status: DC

## 2013-06-15 NOTE — Telephone Encounter (Signed)
Sent to [facility]

## 2013-06-29 ENCOUNTER — Telehealth (HOSPITAL_COMMUNITY): Payer: Self-pay | Admitting: *Deleted

## 2013-06-29 NOTE — Telephone Encounter (Signed)
Samples of abilify 5 mg left for pt

## 2013-07-02 ENCOUNTER — Ambulatory Visit (HOSPITAL_COMMUNITY): Payer: Self-pay | Admitting: Psychiatry

## 2013-07-03 ENCOUNTER — Telehealth (HOSPITAL_COMMUNITY): Payer: Self-pay | Admitting: *Deleted

## 2013-07-04 ENCOUNTER — Telehealth (HOSPITAL_COMMUNITY): Payer: Self-pay | Admitting: *Deleted

## 2013-07-04 NOTE — Telephone Encounter (Signed)
Samples are here

## 2013-07-06 ENCOUNTER — Telehealth (HOSPITAL_COMMUNITY): Payer: Self-pay | Admitting: *Deleted

## 2013-07-11 ENCOUNTER — Telehealth (HOSPITAL_COMMUNITY): Payer: Self-pay | Admitting: *Deleted

## 2013-07-11 ENCOUNTER — Other Ambulatory Visit (HOSPITAL_COMMUNITY): Payer: Self-pay | Admitting: Psychiatry

## 2013-07-11 MED ORDER — TOPIRAMATE 25 MG PO TABS: 25.0000 mg | ORAL_TABLET | Freq: Every day | ORAL | Status: DC

## 2013-07-11 NOTE — Telephone Encounter (Signed)
done

## 2013-07-12 ENCOUNTER — Other Ambulatory Visit: Payer: Self-pay

## 2013-07-31 ENCOUNTER — Other Ambulatory Visit (HOSPITAL_COMMUNITY): Payer: Self-pay | Admitting: Psychiatry

## 2013-07-31 ENCOUNTER — Telehealth (HOSPITAL_COMMUNITY): Payer: Self-pay

## 2013-07-31 MED ORDER — ARIPIPRAZOLE 10 MG PO TABS: 10.0000 mg | ORAL_TABLET | Freq: Every day | ORAL | Status: DC

## 2013-07-31 NOTE — Telephone Encounter (Signed)
Script for Abilify 10 mg sent to [facility] order

## 2013-08-08 ENCOUNTER — Ambulatory Visit (HOSPITAL_COMMUNITY): Payer: Self-pay | Admitting: Psychiatry

## 2013-08-14 ENCOUNTER — Ambulatory Visit (HOSPITAL_COMMUNITY): Payer: Self-pay | Admitting: Psychiatry

## 2013-08-22 ENCOUNTER — Encounter (HOSPITAL_COMMUNITY): Payer: Self-pay | Admitting: Psychiatry

## 2013-08-22 ENCOUNTER — Ambulatory Visit (INDEPENDENT_AMBULATORY_CARE_PROVIDER_SITE_OTHER): Payer: Medicare Other | Admitting: Psychiatry

## 2013-08-22 VITALS — BP 120/80 | Ht 65.0 in | Wt 194.0 lb

## 2013-08-22 DIAGNOSIS — F329 Major depressive disorder, single episode, unspecified: Secondary | ICD-10-CM

## 2013-08-22 MED ORDER — ARIPIPRAZOLE 10 MG PO TABS: 10.0000 mg | ORAL_TABLET | Freq: Every day | ORAL | Status: DC

## 2013-08-22 MED ORDER — DIAZEPAM 2 MG PO TABS: 2.0000 mg | ORAL_TABLET | Freq: Three times a day (TID) | ORAL | Status: DC

## 2013-08-22 MED ORDER — TOPIRAMATE 25 MG PO TABS: 25.0000 mg | ORAL_TABLET | Freq: Every day | ORAL | Status: DC
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
## 2013-09-21 ENCOUNTER — Other Ambulatory Visit (HOSPITAL_COMMUNITY): Payer: Self-pay | Admitting: Psychiatry

## 2013-09-21 ENCOUNTER — Telehealth (HOSPITAL_COMMUNITY): Payer: Self-pay

## 2013-09-21 MED ORDER — ARIPIPRAZOLE 10 MG PO TABS: 10.0000 mg | ORAL_TABLET | Freq: Every day | ORAL | Status: DC

## 2013-09-21 NOTE — Telephone Encounter (Signed)
done

## 2013-10-06 ENCOUNTER — Other Ambulatory Visit (HOSPITAL_COMMUNITY): Payer: Self-pay | Admitting: Psychiatry

## 2013-10-17 ENCOUNTER — Telehealth (HOSPITAL_COMMUNITY): Payer: Self-pay | Admitting: *Deleted

## 2013-10-17 ENCOUNTER — Other Ambulatory Visit (HOSPITAL_COMMUNITY): Payer: Self-pay | Admitting: Psychiatry

## 2013-10-17 MED ORDER — TOPIRAMATE 25 MG PO TABS: 25.0000 mg | ORAL_TABLET | Freq: Every day | ORAL | Status: DC

## 2013-10-17 NOTE — Telephone Encounter (Signed)
done

## 2013-11-19 ENCOUNTER — Encounter (HOSPITAL_COMMUNITY): Payer: Self-pay | Admitting: Psychiatry

## 2013-11-19 ENCOUNTER — Ambulatory Visit (INDEPENDENT_AMBULATORY_CARE_PROVIDER_SITE_OTHER): Payer: Medicare Other | Admitting: Psychiatry

## 2013-11-19 VITALS — BP 100/70 | Ht 65.0 in | Wt 193.0 lb

## 2013-11-19 DIAGNOSIS — F418 Other specified anxiety disorders: Secondary | ICD-10-CM

## 2013-11-19 DIAGNOSIS — F5105 Insomnia due to other mental disorder: Secondary | ICD-10-CM

## 2013-11-19 DIAGNOSIS — F329 Major depressive disorder, single episode, unspecified: Secondary | ICD-10-CM

## 2013-11-19 MED ORDER — TRAZODONE HCL 150 MG PO TABS: 150.0000 mg | ORAL_TABLET | Freq: Every day | ORAL | Status: DC

## 2013-11-19 MED ORDER — ARIPIPRAZOLE 10 MG PO TABS: 10.0000 mg | ORAL_TABLET | Freq: Every day | ORAL | Status: DC

## 2013-11-19 MED ORDER — TOPIRAMATE 25 MG PO TABS: 25.0000 mg | ORAL_TABLET | Freq: Every day | ORAL | Status: DC

## 2013-11-19 MED ORDER — DULOXETINE HCL 60 MG PO CPEP: 60.0000 mg | ORAL_CAPSULE | Freq: Every day | ORAL | Status: DC
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
## 2013-11-20 ENCOUNTER — Telehealth (HOSPITAL_COMMUNITY): Payer: Self-pay | Admitting: *Deleted

## 2013-11-20 NOTE — Telephone Encounter (Signed)
done

## 2013-12-14 ENCOUNTER — Ambulatory Visit (INDEPENDENT_AMBULATORY_CARE_PROVIDER_SITE_OTHER): Payer: Medicare Other | Admitting: Psychiatry

## 2013-12-14 ENCOUNTER — Encounter (HOSPITAL_COMMUNITY): Payer: Self-pay | Admitting: Psychiatry

## 2013-12-14 VITALS — BP 120/78 | Ht 65.0 in | Wt 192.0 lb

## 2013-12-14 DIAGNOSIS — F329 Major depressive disorder, single episode, unspecified: Secondary | ICD-10-CM

## 2013-12-14 DIAGNOSIS — F3289 Other specified depressive episodes: Secondary | ICD-10-CM
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
## 2014-01-25 ENCOUNTER — Encounter (HOSPITAL_COMMUNITY): Payer: Self-pay | Admitting: Psychiatry

## 2014-01-25 ENCOUNTER — Ambulatory Visit (INDEPENDENT_AMBULATORY_CARE_PROVIDER_SITE_OTHER): Payer: Medicare Other | Admitting: Psychiatry

## 2014-01-25 VITALS — BP 128/78 | Ht 65.0 in | Wt 192.0 lb

## 2014-01-25 DIAGNOSIS — F329 Major depressive disorder, single episode, unspecified: Secondary | ICD-10-CM

## 2014-01-25 DIAGNOSIS — F3289 Other specified depressive episodes: Secondary | ICD-10-CM

## 2014-01-25 MED ORDER — DIAZEPAM 2 MG PO TABS: 2.0000 mg | ORAL_TABLET | Freq: Three times a day (TID) | ORAL | Status: DC

## 2014-01-25 MED ORDER — SUVOREXANT 10 MG PO TABS: 10.0000 mg | ORAL_TABLET | Freq: Every day | ORAL | Status: DC
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
## 2014-02-04 ENCOUNTER — Telehealth (HOSPITAL_COMMUNITY): Payer: Self-pay | Admitting: *Deleted

## 2014-02-05 ENCOUNTER — Telehealth (HOSPITAL_COMMUNITY): Payer: Self-pay | Admitting: *Deleted

## 2014-02-05 ENCOUNTER — Other Ambulatory Visit (HOSPITAL_COMMUNITY): Payer: Self-pay | Admitting: Psychiatry

## 2014-02-05 NOTE — Telephone Encounter (Signed)
Dalmane called in

## 2014-02-05 NOTE — Telephone Encounter (Signed)
Dalmane 30 mg,#30 called in

## 2014-02-18 ENCOUNTER — Other Ambulatory Visit (HOSPITAL_COMMUNITY): Payer: Self-pay | Admitting: Psychiatry

## 2014-02-18 ENCOUNTER — Telehealth (HOSPITAL_COMMUNITY): Payer: Self-pay | Admitting: *Deleted

## 2014-02-18 ENCOUNTER — Other Ambulatory Visit: Payer: Self-pay | Admitting: Orthopedic Surgery

## 2014-02-18 NOTE — Telephone Encounter (Signed)
Called in Ambien 10 mg #30

## 2014-02-19 ENCOUNTER — Encounter (HOSPITAL_BASED_OUTPATIENT_CLINIC_OR_DEPARTMENT_OTHER): Payer: Self-pay | Admitting: *Deleted

## 2014-02-19 NOTE — Progress Notes (Signed)
02/19/14 1703  OBSTRUCTIVE SLEEP APNEA  Have you ever been diagnosed with sleep apnea through a sleep study? No  Do you snore loudly (loud enough to be heard through closed doors)?  1  Do you often feel tired, fatigued, or sleepy during the daytime? 0  Has anyone observed you stop breathing during your sleep? 0  Do you have, or are you being treated for high blood pressure? 1  BMI more than 35 kg/m2? 0  Age over 76 years old? 1  Neck circumference greater than 40 cm/16 inches? 1  Gender: 0  Obstructive Sleep Apnea Score 4  Score 4 or greater  Results sent to PCP

## 2014-02-19 NOTE — Progress Notes (Signed)
To go to [facility] 02/25/14 for pt ptt [id number] To bring all meds and ins cards and id

## 2014-02-25 ENCOUNTER — Encounter (HOSPITAL_COMMUNITY)
Admission: RE | Admit: 2014-02-25 | Discharge: 2014-02-25 | Disposition: A | Discharge status: Home / Self Care | Payer: Medicare Other | Source: Ambulatory Visit | Attending: Orthopedic Surgery | Admitting: Orthopedic Surgery

## 2014-02-25 ENCOUNTER — Other Ambulatory Visit: Payer: Self-pay

## 2014-02-25 LAB — BASIC METABOLIC PANEL
BUN: 17 mg/dL (ref 6–23)
CALCIUM: 9.3 mg/dL (ref 8.4–10.5)
CO2: 28 mEq/L (ref 19–32)
CREATININE: 0.76 mg/dL (ref 0.50–1.10)
Chloride: 103 mEq/L (ref 96–112)
GFR calc Af Amer: >90 mL/min (ref >90)
GFR calc non Af Amer: 80 mL/min — ABNORMAL LOW (ref >90)
Glucose, Bld: 100 mg/dL — ABNORMAL HIGH (ref 70–99)
Potassium: 4.4 mEq/L (ref 3.7–5.3)
Sodium: 143 mEq/L (ref 137–147)

## 2014-02-25 LAB — APTT: aPTT: 32 seconds (ref 24–37)

## 2014-02-25 LAB — PROTIME-INR
INR: 1.09 (ref 0.00–1.49)
PROTHROMBIN TIME: 13.9 s (ref 11.6–15.2)

## 2014-02-26 ENCOUNTER — Encounter (HOSPITAL_BASED_OUTPATIENT_CLINIC_OR_DEPARTMENT_OTHER)
Admission: RE | Disposition: A | Discharge status: Home / Self Care | Payer: Self-pay | Source: Ambulatory Visit | Attending: Orthopedic Surgery

## 2014-02-26 ENCOUNTER — Encounter (HOSPITAL_BASED_OUTPATIENT_CLINIC_OR_DEPARTMENT_OTHER): Payer: Self-pay | Admitting: *Deleted

## 2014-02-26 ENCOUNTER — Ambulatory Visit (HOSPITAL_BASED_OUTPATIENT_CLINIC_OR_DEPARTMENT_OTHER)
Admission: RE | Admit: 2014-02-26 | Discharge: 2014-02-26 | Disposition: A | Discharge status: Home / Self Care | Payer: Medicare Other | Source: Ambulatory Visit | Attending: Orthopedic Surgery | Admitting: Orthopedic Surgery

## 2014-02-26 ENCOUNTER — Ambulatory Visit (HOSPITAL_BASED_OUTPATIENT_CLINIC_OR_DEPARTMENT_OTHER): Payer: Medicare Other | Admitting: Anesthesiology

## 2014-02-26 ENCOUNTER — Encounter (HOSPITAL_BASED_OUTPATIENT_CLINIC_OR_DEPARTMENT_OTHER): Payer: Medicare Other | Admitting: Anesthesiology

## 2014-02-26 DIAGNOSIS — Z87891 Personal history of nicotine dependence: Secondary | ICD-10-CM | POA: Insufficient documentation

## 2014-02-26 DIAGNOSIS — Z86711 Personal history of pulmonary embolism: Secondary | ICD-10-CM | POA: Insufficient documentation

## 2014-02-26 DIAGNOSIS — K219 Gastro-esophageal reflux disease without esophagitis: Secondary | ICD-10-CM | POA: Insufficient documentation

## 2014-02-26 DIAGNOSIS — M1990 Unspecified osteoarthritis, unspecified site: Secondary | ICD-10-CM | POA: Insufficient documentation

## 2014-02-26 DIAGNOSIS — M329 Systemic lupus erythematosus, unspecified: Secondary | ICD-10-CM | POA: Insufficient documentation

## 2014-02-26 DIAGNOSIS — I10 Essential (primary) hypertension: Secondary | ICD-10-CM | POA: Insufficient documentation

## 2014-02-26 DIAGNOSIS — M810 Age-related osteoporosis without current pathological fracture: Secondary | ICD-10-CM | POA: Insufficient documentation

## 2014-02-26 DIAGNOSIS — S42309S Unspecified fracture of shaft of humerus, unspecified arm, sequela: Secondary | ICD-10-CM | POA: Insufficient documentation

## 2014-02-26 DIAGNOSIS — Z7901 Long term (current) use of anticoagulants: Secondary | ICD-10-CM | POA: Insufficient documentation

## 2014-02-26 DIAGNOSIS — X58XXXS Exposure to other specified factors, sequela: Secondary | ICD-10-CM | POA: Insufficient documentation

## 2014-02-26 DIAGNOSIS — M66339 Spontaneous rupture of flexor tendons, unspecified forearm: Secondary | ICD-10-CM | POA: Insufficient documentation

## 2014-02-26 DIAGNOSIS — F3289 Other specified depressive episodes: Secondary | ICD-10-CM | POA: Insufficient documentation

## 2014-02-26 DIAGNOSIS — F411 Generalized anxiety disorder: Secondary | ICD-10-CM | POA: Insufficient documentation

## 2014-02-26 DIAGNOSIS — F329 Major depressive disorder, single episode, unspecified: Secondary | ICD-10-CM | POA: Insufficient documentation

## 2014-02-26 DIAGNOSIS — E785 Hyperlipidemia, unspecified: Secondary | ICD-10-CM | POA: Insufficient documentation

## 2014-02-26 DIAGNOSIS — G4700 Insomnia, unspecified: Secondary | ICD-10-CM | POA: Insufficient documentation

## 2014-02-26 DIAGNOSIS — M66349 Spontaneous rupture of flexor tendons, unspecified hand: Principal | ICD-10-CM

## 2014-02-26 DIAGNOSIS — H3530 Unspecified macular degeneration: Secondary | ICD-10-CM | POA: Insufficient documentation

## 2014-02-26 HISTORY — DX: Depression, unspecified: F32.A

## 2014-02-26 HISTORY — DX: Presence of spectacles and contact lenses: Z97.3

## 2014-02-26 HISTORY — DX: Major depressive disorder, single episode, unspecified: F32.9

## 2014-02-26 HISTORY — DX: Anxiety disorder, unspecified: F41.9

## 2014-02-26 HISTORY — DX: Insomnia, unspecified: G47.00

## 2014-02-26 HISTORY — PX: HARDWARE REMOVAL: SHX979

## 2014-02-26 SURGERY — REPAIR, TENDON, FLEXOR
Anesthesia: Regional | Site: Arm Lower | Laterality: Left

## 2014-02-26 MED ORDER — LIDOCAINE HCL (CARDIAC) 20 MG/ML IV SOLN
INTRAVENOUS | Status: DC | PRN
  Administered 2014-02-26: 50 mg via INTRAVENOUS

## 2014-02-26 MED ORDER — MIDAZOLAM HCL 2 MG/2ML IJ SOLN
INTRAMUSCULAR | Status: AC
  Filled 2014-02-26: qty 2

## 2014-02-26 MED ORDER — PHENYLEPHRINE HCL 10 MG/ML IJ SOLN
INTRAMUSCULAR | Status: DC | PRN
  Administered 2014-02-26: 40 ug via INTRAVENOUS

## 2014-02-26 MED ORDER — EPHEDRINE SULFATE 50 MG/ML IJ SOLN
INTRAMUSCULAR | Status: DC | PRN
  Administered 2014-02-26: 10 mg via INTRAVENOUS
  Administered 2014-02-26: 15 mg via INTRAVENOUS
  Administered 2014-02-26: 10 mg via INTRAVENOUS

## 2014-02-26 MED ORDER — CEFAZOLIN SODIUM-DEXTROSE 2-3 GM-% IV SOLR
INTRAVENOUS | Status: AC
  Filled 2014-02-26: qty 50

## 2014-02-26 MED ORDER — PROPOFOL 10 MG/ML IV BOLUS
INTRAVENOUS | Status: DC | PRN
  Administered 2014-02-26: 100 mg via INTRAVENOUS

## 2014-02-26 MED ORDER — CHLORHEXIDINE GLUCONATE 4 % EX LIQD: 60.0000 mL | Freq: Once | CUTANEOUS | Status: DC

## 2014-02-26 MED ORDER — HYDROMORPHONE HCL PF 1 MG/ML IJ SOLN: 0.2500 mg | INTRAMUSCULAR | Status: DC | PRN

## 2014-02-26 MED ORDER — FENTANYL CITRATE 0.05 MG/ML IJ SOLN
INTRAMUSCULAR | Status: AC
  Filled 2014-02-26: qty 6

## 2014-02-26 MED ORDER — DEXAMETHASONE SODIUM PHOSPHATE 4 MG/ML IJ SOLN
INTRAMUSCULAR | Status: DC | PRN
  Administered 2014-02-26: 8 mg via INTRAVENOUS

## 2014-02-26 MED ORDER — LACTATED RINGERS IV SOLN
INTRAVENOUS | Status: DC
  Administered 2014-02-26 (×2): via INTRAVENOUS

## 2014-02-26 MED ORDER — ROPIVACAINE HCL 5 MG/ML IJ SOLN
INTRAMUSCULAR | Status: DC | PRN
  Administered 2014-02-26: 35 mL via EPIDURAL

## 2014-02-26 MED ORDER — BUPIVACAINE HCL (PF) 0.25 % IJ SOLN
INTRAMUSCULAR | Status: AC
  Filled 2014-02-26: qty 30

## 2014-02-26 MED ORDER — FENTANYL CITRATE 0.05 MG/ML IJ SOLN
INTRAMUSCULAR | Status: AC
  Filled 2014-02-26: qty 2

## 2014-02-26 MED ORDER — DEXTROSE 5 % IV SOLN
10.0000 mg | INTRAVENOUS | Status: DC | PRN
  Administered 2014-02-26: 40 ug/min via INTRAVENOUS

## 2014-02-26 MED ORDER — MIDAZOLAM HCL 2 MG/2ML IJ SOLN: 1.0000 mg | INTRAMUSCULAR | Status: DC | PRN

## 2014-02-26 MED ORDER — OXYCODONE-ACETAMINOPHEN 5-325 MG PO TABS: ORAL_TABLET | ORAL | Status: DC

## 2014-02-26 MED ORDER — CEFAZOLIN SODIUM-DEXTROSE 2-3 GM-% IV SOLR
2.0000 g | INTRAVENOUS | Status: AC
  Administered 2014-02-26: 2 g via INTRAVENOUS

## 2014-02-26 MED ORDER — FENTANYL CITRATE 0.05 MG/ML IJ SOLN
50.0000 ug | INTRAMUSCULAR | Status: DC | PRN
  Administered 2014-02-26: 50 ug via INTRAVENOUS

## 2014-02-26 SURGICAL SUPPLY — 80 items
BAG DECANTER FOR FLEXI CONT (MISCELLANEOUS) IMPLANT
BANDAGE ELASTIC 3 VELCRO ST LF (GAUZE/BANDAGES/DRESSINGS) ×3 IMPLANT
BLADE MINI RND TIP GREEN BEAV (BLADE) IMPLANT
BLADE SURG 15 STRL LF DISP TIS (BLADE) ×3 IMPLANT
BLADE SURG 15 STRL SS (BLADE) ×6
BNDG COHESIVE 1X5 TAN STRL LF (GAUZE/BANDAGES/DRESSINGS) IMPLANT
BNDG CONFORM 2 STRL LF (GAUZE/BANDAGES/DRESSINGS) IMPLANT
BNDG ELASTIC 2 VLCR STRL LF (GAUZE/BANDAGES/DRESSINGS) IMPLANT
BNDG ESMARK 4X9 LF (GAUZE/BANDAGES/DRESSINGS) ×3 IMPLANT
BNDG GAUZE ELAST 4 BULKY (GAUZE/BANDAGES/DRESSINGS) ×3 IMPLANT
CHLORAPREP W/TINT 26ML (MISCELLANEOUS) ×3 IMPLANT
CLOSURE WOUND 1/4X4 (GAUZE/BANDAGES/DRESSINGS)
CORDS BIPOLAR (ELECTRODE) ×3 IMPLANT
COTTONBALL LRG STERILE PKG (GAUZE/BANDAGES/DRESSINGS) IMPLANT
COVER MAYO STAND STRL (DRAPES) ×3 IMPLANT
COVER TABLE BACK 60X90 (DRAPES) ×3 IMPLANT
CUFF TOURNIQUET SINGLE 18IN (TOURNIQUET CUFF) ×3 IMPLANT
DECANTER SPIKE VIAL GLASS SM (MISCELLANEOUS) IMPLANT
DRAIN TLS ROUND 10FR (DRAIN) IMPLANT
DRAPE EXTREMITY T 121X128X90 (DRAPE) ×3 IMPLANT
DRAPE OEC MINIVIEW 54X84 (DRAPES) IMPLANT
DRAPE SURG 17X23 STRL (DRAPES) ×3 IMPLANT
DRSG PAD ABDOMINAL 8X10 ST (GAUZE/BANDAGES/DRESSINGS) IMPLANT
GAUZE SPONGE 4X4 12PLY STRL (GAUZE/BANDAGES/DRESSINGS) ×3 IMPLANT
GAUZE SPONGE 4X4 16PLY XRAY LF (GAUZE/BANDAGES/DRESSINGS) IMPLANT
GAUZE XEROFORM 1X8 LF (GAUZE/BANDAGES/DRESSINGS) ×3 IMPLANT
GLOVE BIO SURGEON STRL SZ7.5 (GLOVE) ×9 IMPLANT
GLOVE BIOGEL PI IND STRL 7.0 (GLOVE) ×3 IMPLANT
GLOVE BIOGEL PI IND STRL 8 (GLOVE) ×2 IMPLANT
GLOVE BIOGEL PI IND STRL 8.5 (GLOVE) ×1 IMPLANT
GLOVE BIOGEL PI INDICATOR 7.0 (GLOVE) ×6
GLOVE BIOGEL PI INDICATOR 8 (GLOVE) ×4
GLOVE BIOGEL PI INDICATOR 8.5 (GLOVE) ×2
GLOVE ECLIPSE 7.0 STRL STRAW (GLOVE) ×6 IMPLANT
GLOVE SURG ORTHO 8.0 STRL STRW (GLOVE) ×3 IMPLANT
GOWN STRL REUS W/ TWL LRG LVL3 (GOWN DISPOSABLE) ×3 IMPLANT
GOWN STRL REUS W/ TWL XL LVL3 (GOWN DISPOSABLE) IMPLANT
GOWN STRL REUS W/TWL LRG LVL3 (GOWN DISPOSABLE) ×6
GOWN STRL REUS W/TWL XL LVL3 (GOWN DISPOSABLE) ×9 IMPLANT
K-WIRE .035X4 (WIRE) IMPLANT
LOOP VESSEL MAXI BLUE (MISCELLANEOUS) IMPLANT
NEEDLE HYPO 25X1 1.5 SAFETY (NEEDLE) IMPLANT
NEEDLE KEITH (NEEDLE) IMPLANT
NS IRRIG 1000ML POUR BTL (IV SOLUTION) ×3 IMPLANT
PACK BASIN DAY SURGERY FS (CUSTOM PROCEDURE TRAY) ×3 IMPLANT
PAD CAST 3X4 CTTN HI CHSV (CAST SUPPLIES) ×1 IMPLANT
PAD CAST 4YDX4 CTTN HI CHSV (CAST SUPPLIES) IMPLANT
PADDING CAST ABS 3INX4YD NS (CAST SUPPLIES)
PADDING CAST ABS 4INX4YD NS (CAST SUPPLIES) ×2
PADDING CAST ABS COTTON 3X4 (CAST SUPPLIES) IMPLANT
PADDING CAST ABS COTTON 4X4 ST (CAST SUPPLIES) ×1 IMPLANT
PADDING CAST COTTON 3X4 STRL (CAST SUPPLIES) ×2
PADDING CAST COTTON 4X4 STRL (CAST SUPPLIES)
SLEEVE SCD COMPRESS KNEE MED (MISCELLANEOUS) ×3 IMPLANT
SPLINT PLASTER CAST XFAST 3X15 (CAST SUPPLIES) IMPLANT
SPLINT PLASTER CAST XFAST 4X15 (CAST SUPPLIES) IMPLANT
SPLINT PLASTER XTRA FAST SET 4 (CAST SUPPLIES)
SPLINT PLASTER XTRA FASTSET 3X (CAST SUPPLIES)
STOCKINETTE 4X48 STRL (DRAPES) ×3 IMPLANT
STRIP CLOSURE SKIN 1/4X4 (GAUZE/BANDAGES/DRESSINGS) IMPLANT
SUT CHROMIC 5 0 P 3 (SUTURE) IMPLANT
SUT ETHIBOND 3-0 V-5 (SUTURE) IMPLANT
SUT ETHILON 3 0 PS 1 (SUTURE) IMPLANT
SUT ETHILON 4 0 PS 2 18 (SUTURE) ×6 IMPLANT
SUT FIBERWIRE 4-0 18 DIAM BLUE (SUTURE)
SUT MERSILENE 2.0 SH NDLE (SUTURE) ×3 IMPLANT
SUT MERSILENE 4 0 P 3 (SUTURE) ×3 IMPLANT
SUT MNCRL AB 4-0 PS2 18 (SUTURE) IMPLANT
SUT PROLENE 5 0 P 3 (SUTURE) IMPLANT
SUT SILK 4 0 PS 2 (SUTURE) IMPLANT
SUT VIC AB 3-0 FS2 27 (SUTURE) IMPLANT
SUT VIC AB 4-0 P-3 18XBRD (SUTURE) IMPLANT
SUT VIC AB 4-0 P3 18 (SUTURE)
SUT VICRYL 4-0 PS2 18IN ABS (SUTURE) IMPLANT
SUTURE FIBERWR 4-0 18 DIA BLUE (SUTURE) IMPLANT
SYR BULB 3OZ (MISCELLANEOUS) ×3 IMPLANT
SYR CONTROL 10ML LL (SYRINGE) IMPLANT
TOWEL OR 17X24 6PK STRL BLUE (TOWEL DISPOSABLE) ×3 IMPLANT
TUBE FEEDING 5FR 15 INCH (TUBING) IMPLANT
UNDERPAD 30X30 INCONTINENT (UNDERPADS AND DIAPERS) ×3 IMPLANT

## 2014-02-26 NOTE — Anesthesia Procedure Notes (Addendum)
Anesthesia Regional Block:  Supraclavicular block  Pre-Anesthetic Checklist: ,, timeout performed, Correct Patient, Correct Site, Correct Laterality, Correct Procedure, Correct Position, site marked, Risks and benefits discussed, pre-op evaluation, post-op pain management  Laterality: Left  Prep: Maximum Sterile Barrier Precautions used and chloraprep       Needles:  Injection technique: Single-shot  Needle Type: Echogenic Stimulator Needle     Needle Length: 5cm 5 cm Needle Gauge: 22 and 22 G    Additional Needles:  Procedures: ultrasound guided (picture in chart) Supraclavicular block Narrative:  Start time: 02/26/2014 8:04 AM End time: 02/26/2014 8:15 AM Injection made incrementally with aspirations every 5 mL. Anesthesiologist: Fitzgerald,MD  Additional Notes: 2% Lidocaine skin wheel. Intercostobrachial block with 5cc of 0.5% Ropivicaine plain.   Procedure Name: LMA Insertion Date/Time: 02/26/2014 8:47 AM Performed by: Zenia Resides D Pre-anesthesia Checklist: Patient identified, Emergency Drugs available, Suction available and Patient being monitored Patient Re-evaluated:Patient Re-evaluated prior to inductionOxygen Delivery Method: Circle System Utilized Preoxygenation: Pre-oxygenation with 100% oxygen Intubation Type: IV induction Ventilation: Mask ventilation without difficulty LMA: LMA inserted LMA Size: 4.0 Number of attempts: 1 Airway Equipment and Method: bite block Placement Confirmation: positive ETCO2 Tube secured with: Tape Dental Injury: Teeth and Oropharynx as per pre-operative assessment

## 2014-02-26 NOTE — Anesthesia Preprocedure Evaluation (Addendum)
Anesthesia Evaluation  Patient identified by MRN, date of birth, ID band Patient awake    Reviewed: Allergy & Precautions, H&P , NPO status , Patient's Chart, lab work & pertinent test results, reviewed documented beta blocker date and time   Airway Mallampati: II TM Distance: >3 FB Neck ROM: Full    Dental no notable dental hx. (+) Teeth Intact, Dental Advisory Given   Pulmonary neg pulmonary ROS, former smoker,  breath sounds clear to auscultation  Pulmonary exam normal       Cardiovascular hypertension, On Medications and On Home Beta Blockers Rhythm:Regular Rate:Normal     Neuro/Psych  Headaches, Seizures -, Well Controlled,  Anxiety Depression negative neurological ROS  negative psych ROS   GI/Hepatic negative GI ROS, Neg liver ROS, GERD-  Medicated and Controlled,  Endo/Other  negative endocrine ROS  Renal/GU negative Renal ROS  negative genitourinary   Musculoskeletal   Abdominal   Peds  Hematology negative hematology ROS (+)   Anesthesia Other Findings   Reproductive/Obstetrics negative OB ROS                          Anesthesia Physical Anesthesia Plan  ASA: III  Anesthesia Plan: General and Regional   Post-op Pain Management:    Induction: Intravenous  Airway Management Planned: LMA  Additional Equipment:   Intra-op Plan:   Post-operative Plan: Extubation in OR  Informed Consent: I have reviewed the patients History and Physical, chart, labs and discussed the procedure including the risks, benefits and alternatives for the proposed anesthesia with the patient or authorized representative who has indicated his/her understanding and acceptance.   Dental advisory given  Plan Discussed with: CRNA and Surgeon  Anesthesia Plan Comments:         Anesthesia Quick Evaluation

## 2014-02-26 NOTE — Transfer of Care (Signed)
Immediate Anesthesia Transfer of Care Note  Patient: Grace Howard  Procedure(s) Performed: Procedure(s): LEFT FLEXOR POLLICIS LONGUS REPAIR WITH PALMARIS LONGUS GRAFT  (Left) REMOVAL HARDWARE  (Left)  Patient Location: PACU  Anesthesia Type:General and Regional  Level of Consciousness: awake, alert  and oriented  Airway & Oxygen Therapy: Patient Spontanous Breathing and Patient connected to face mask oxygen  Post-op Assessment: Report given to PACU RN and Post -op Vital signs reviewed and stable  Post vital signs: Reviewed and stable  Complications: No apparent anesthesia complications

## 2014-02-26 NOTE — Op Note (Signed)
[id number] 

## 2014-02-26 NOTE — Brief Op Note (Signed)
02/26/2014  10:36 AM  PATIENT:  Grace Howard  78 y.o. female  PRE-OPERATIVE DIAGNOSIS:  Left Flexor Pollicis Longus RUPTURE AND RETAINED HARDWARE     POST-OPERATIVE DIAGNOSIS:  Left Flexor Pollicis Longus RUPTURE AND RETAINED HARDWARE  PROCEDURE:  Procedure(s): LEFT FLEXOR POLLICIS LONGUS REPAIR WITH PALMARIS LONGUS GRAFT  (Left) REMOVAL HARDWARE  (Left)  SURGEON:  Surgeon(s) and Role:    * Tami Ribas, MD - Primary    * Nicki Reaper, MD - Assisting  PHYSICIAN ASSISTANT:   ASSISTANTS: Cindee Salt, MD   ANESTHESIA:   regional and general  EBL:  Total I/O In: 1000 [I.V.:1000] Out: -   BLOOD ADMINISTERED:none  DRAINS: none   LOCAL MEDICATIONS USED:  NONE  SPECIMEN:  No Specimen  DISPOSITION OF SPECIMEN:  N/A  COUNTS:  YES  TOURNIQUET:   Total Tourniquet Time Documented: Upper Arm (Left) - 92 minutes Total: Upper Arm (Left) - 92 minutes   DICTATION: .Other Dictation: Dictation Number [id number]  PLAN OF CARE: Discharge to home after PACU  PATIENT DISPOSITION:  PACU - hemodynamically stable.

## 2014-02-26 NOTE — Op Note (Signed)
NAMEMARIHA, Grace Howard                ACCOUNT NO.:  [id number]  MEDICAL RECORD NO.:  [id number]  LOCATION:                                 FACILITY:  PHYSICIAN:  Betha Loa, MD        DATE OF BIRTH:  Sep 07, 1935  DATE OF PROCEDURE:  02/26/2014 DATE OF DISCHARGE:                              OPERATIVE REPORT   PREOPERATIVE DIAGNOSIS:  Left thumb flexor pollicis longus rupture and retained hardware.  POSTOPERATIVE DIAGNOSIS:  Left thumb flexor pollicis longus rupture and retained hardware.  PROCEDURE:   1. Removal of left distal radius volar plate 2. Carpal tunnel release 3. Repair of FPL rupture with palmaris longus tendon graft zone 3.  SURGEON:  Betha Loa, MD.  ASSISTANT:  Cindee Salt, MD.  ANESTHESIA:  General with regional.  IV FLUIDS:  Per anesthesia flow sheet.  ESTIMATED BLOOD LOSS:  Minimal.  COMPLICATIONS:  None.  SPECIMENS:  None .  TOURNIQUET TIME:  92 minutes.  DISPOSITION:  Stable to PACU.  INDICATIONS:  Ms. Grace Howard is a 78 year old left-hand dominant female who had previously undergone open reduction  and internal fixation of left distal radius fracture.  Approximately 6 weeks ago, she noted inability to flex the IP joint of her thumb.  She presented to me for evaluation. She was found to have a FPL tendon rupture.  We discussed the nature of the condition.  She wished to proceed with operative treatment.  Risks, benefits, and alternatives surgery were discussed including risk of blood loss, infection, damage to nerves, vessels, tendons, ligaments, bone, failure to surgery, need for additional surgery, complications with wound healing, continued pain, and stiffness.  She voiced understanding these risks and elected to proceed.  OPERATIVE COURSE:  After being identified preoperatively by myself, the patient and I agreed upon procedure and site procedure.  The surgical site was marked.  The risks, benefits, and alternatives of surgery were reviewed and  she wished to proceed.  Surgical consent had been signed. She was given IV Ancef as preoperative antibiotic prophylaxis.  Regional block was performed by anesthesia in preoperative holding.  She was transferred to the operating room and placed on the operating table in supine position with left upper extremity on arm board.  General anesthesia was induced by Anesthesiology.  Left upper extremity was prepped and draped in normal sterile orthopedic fashion.  Surgical pause was performed between surgeons, anesthesia, and operating room staff and all were in agreement as to the patient, procedure, and site of procedure.  Previous scar was followed.  This was carried into subcutaneous tissues by spreading technique.  There was scarring in the subcutaneous tissues.  The superficial and deep portions of the FCR tendon sheath were incised, and the FCR swept ulnarly to protect the palmar cutaneous branch of the median nerve.  The FPL was identified. There was pseudo tendon.  This was swept ulnarly as well.  The plate was identified.  It was prominent, especially distally.  It was cleared of its soft tissue coverage.  The plate was removed successfully.  The rongeurs and osteotomes were used to smooth out the bone where the bone is start growing up  over the edge of the plate.  The wound was copiously irrigated with sterile saline.  The distal stump of the tendon was not able to be retrieved through this wound.  The wound was extended over the palm.  The carpal tunnel was released.  Care was taken to protect the palmar cutaneous branch of the median nerve throughout the case. The median nerve was identified and the motor branch was identified and was intact.  The distal stump of the tendon was identified at the thenar eminence.  It had started to scar down some.  This was freed up.  The tendon end was frayed.  This was freshened.  The pseudo tendon was removed to the level of good tendon.  The  palmaris longus was harvested through the current incision and an additional incision proximally in the forearm.  The palmaris longus was then woven through the distal stump and a Pulvertaft weave.  This was secured with 2-0 Ethibond suture.  This provided a good secure repair.  The end of the tendon was then brought back over the repair site and secured with 4-0 Mersilene suture.  The tendon graft was passed back along the course of the FPL and Pulvertaft weave was used in the proximal stump of the tendon.  The muscle had been re-elongated as best possible.  Again, this was secured with 2-0 Ethibond suture.  The tendon end was then placed back over the repaired site and secured with 4-0 Mersilene.  The repair had been tensioned so that with the wrist in neutral, the tip of the thumb was contacting the pulp of the index finger. With the wrist in extension, it contacted the long finger and with the wrist flexed, came out into full extension.  The wound was copiously irrigated with sterile saline.  It was then closed with 4-0 nylon in horizontal mattress fashion.  It was then dressed with sterile Xeroform, 4x4s, and wrapped with Kerlix bandage.  A thumb spica splint was placed with the wrist flexed approximately 30 degrees in the thumb in palmar abduction and flexion. The tourniquet was deflated at 92 minutes.  Fingertips were pink with brisk capillary refill after deflation of tourniquet.  The operative drapes were broken down, and the patient was awoken from anesthesia safely.  She was transferred back to the stretcher and taken to PACU in stable condition.  I will see her back in the office in 1 week for postoperative followup.  I will give her Percocet 5/325, 1-2 p.o. q.6 hours p.r.n. pain, dispensed #40.  She will restart her Coumadin.     Betha Loa, MD     KK/MEDQ  D:  02/26/2014  T:  02/26/2014  Job:  [id number]

## 2014-02-26 NOTE — Discharge Instructions (Addendum)
[facility] Instructions °Hand Surgery ° °Wound Care: °Keep your hand elevated above the level of your heart.  Do not allow it to dangle by your side.  Keep the dressing dry and do not remove it unless your doctor advises you to do so.  He will usually change it at the time of your post-op visit.  Moving your fingers is advised to stimulate circulation but will depend on the site of your surgery.  If you have a splint applied, your doctor will advise you regarding movement. ° °Activity: °Do not drive or operate machinery today.  Rest today and then you may return to your normal activity and work as indicated by your physician. ° °Diet:  °Drink liquids today or eat a light diet.  You may resume a regular diet tomorrow.   ° °General expectations: °Pain for two to three days. °Fingers may become slightly swollen. ° °Call your doctor if any of the following occur: °Severe pain not relieved by pain medication. °Elevated temperature. °Dressing soaked with blood. °Inability to move fingers. °White or bluish color to fingers. ° ° °Regional Anesthesia Blocks ° °1. Numbness or the inability to move the "blocked" extremity may last from 3-48 hours after placement. The length of time depends on the medication injected and your individual response to the medication. If the numbness is not going away after 48 hours, call your surgeon. ° °2. The extremity that is blocked will need to be protected until the numbness is gone and the  Strength has returned. Because you cannot feel it, you will need to take extra care to avoid injury. Because it may be weak, you may have difficulty moving it or using it. You may not know what position it is in without looking at it while the block is in effect. ° °3. For blocks in the legs and feet, returning to weight bearing and walking needs to be done carefully. You will need to wait until the numbness is entirely gone and the strength has returned. You should be able to move your leg and foot  normally before you try and bear weight or walk. You will need someone to be with you when you first try to ensure you do not fall and possibly risk injury. ° °4. Bruising and tenderness at the needle site are common side effects and will resolve in a few days. ° °5. Persistent numbness or new problems with movement should be communicated to the surgeon or the [facility] ([phone])/ [facility] ([phone]). ° ° °Post Anesthesia Home Care Instructions ° °Activity: °Get plenty of rest for the remainder of the day. A responsible adult should stay with you for 24 hours following the procedure.  °For the next 24 hours, DO NOT: °-Drive a car °-Operate machinery °-Drink alcoholic beverages °-Take any medication unless instructed by your physician °-Make any legal decisions or sign important papers. ° °Meals: °Start with liquid foods such as gelatin or soup. Progress to regular foods as tolerated. Avoid greasy, spicy, heavy foods. If nausea and/or vomiting occur, drink only clear liquids until the nausea and/or vomiting subsides. Call your physician if vomiting continues. ° °Special Instructions/Symptoms: °Your throat may feel dry or sore from the anesthesia or the breathing tube placed in your throat during surgery. If this causes discomfort, gargle with warm salt water. The discomfort should disappear within 24 hours. ° °

## 2014-02-26 NOTE — H&P (Signed)
Grace Howard is an 78 y.o. female.   Chief Complaint: left fpl rupture HPI: 78 yo lhd female with history of left distal radius orif.  Noted inability to flex ip of left thumb suddenly on 01/13/14.  MRI confirms fpl tendon rupture.  She wishes to have hardware removal and fpl reconstruction.  Past Medical History  Diagnosis Date  . Lupus   . Osteoporosis   . PE (pulmonary embolism) 2008    not sure why  . Hyperlipidemia   . Headache(784.0)   . HTN (hypertension)   . GERD (gastroesophageal reflux disease)   . Osteoarthritis June 2013  . Macular degeneration June 2013  . Seizures   . Wears glasses   . Depression   . Anxiety   . Insomnia     Past Surgical History  Procedure Laterality Date  . Ankle fracture surgery  2000    right  . Wrist surgery  2011    may-lt  . Abdomnial surgery    . Appendectomy    . Dilitation & currettage/hystroscopy with essure    . Eye surgery      both cataracts  . Colonoscopy    . Tonsillectomy    . Cardiac catheterization  2008    PE-filter in    Family History  Problem Relation Age of Onset  . Depression Sister   . Anxiety disorder Sister   . Sexual abuse Sister   . Dementia Father   . Alcohol abuse Paternal Aunt   . Drug abuse Paternal Uncle   . Bipolar disorder Neg Hx   . OCD Neg Hx   . Paranoid behavior Neg Hx   . Schizophrenia Neg Hx   . Seizures Neg Hx   . Physical abuse Neg Hx   . ADD / ADHD Other   . ADD / ADHD Other   . Healthy Son   . Healthy Son    Social History:  reports that she quit smoking about 36 years ago. She does not have any smokeless tobacco history on file. She reports that she does not drink alcohol or use illicit drugs.  Allergies:  Allergies  Allergen Reactions  . Tegretol [Carbamazepine] Other (See Comments)    Thought to have caused her Lupus    Medications Prior to Admission  Medication Sig Dispense Refill  . alendronate (FOSAMAX) 70 MG tablet Take 70 mg by mouth every 7 (seven) days. Take  with a full glass of water on an empty stomach.      Marland Kitchen amLODipine (NORVASC) 5 MG tablet Take 5 mg by mouth daily.      . ARIPiprazole (ABILIFY) 10 MG tablet Take 10 mg by mouth every evening.      . benazepril (LOTENSIN) 10 MG tablet Take 10 mg by mouth daily.      . diazepam (VALIUM) 2 MG tablet Take 1 tablet (2 mg total) by mouth 3 (three) times daily.  90 tablet  2  . hydroxychloroquine (PLAQUENIL) 200 MG tablet Take by mouth 2 (two) times daily.      Marland Kitchen levETIRAcetam (KEPPRA) 500 MG tablet Take 500 mg by mouth every 12 (twelve) hours.      . metoprolol succinate (TOPROL-XL) 25 MG 24 hr tablet Take 25 mg by mouth daily.      . pantoprazole (PROTONIX) 40 MG tablet Take 40 mg by mouth daily.      . pravastatin (PRAVACHOL) 80 MG tablet Take 80 mg by mouth daily.      Marland Kitchen  topiramate (TOPAMAX) 25 MG tablet Take 1 tablet (25 mg total) by mouth at bedtime.  90 tablet  3  . warfarin (COUMADIN) 2 MG tablet Take 2 mg by mouth daily.      Marland Kitchen zolpidem (AMBIEN) 10 MG tablet Take 10 mg by mouth at bedtime as needed for sleep.      . DULoxetine (CYMBALTA) 60 MG capsule Take 1 capsule (60 mg total) by mouth daily.  30 capsule  2    Results for orders placed during the hospital encounter of 02/25/14 (from the past 48 hour(s))  BASIC METABOLIC PANEL     Status: Abnormal   Collection Time    02/25/14  1:05 PM      Result Value Ref Range   Sodium 143  137 - 147 mEq/L   Potassium 4.4  3.7 - 5.3 mEq/L   Chloride 103  96 - 112 mEq/L   CO2 28  19 - 32 mEq/L   Glucose, Bld 100 (*) 70 - 99 mg/dL   BUN 17  6 - 23 mg/dL   Creatinine, Ser 0.76  0.50 - 1.10 mg/dL   Calcium 9.3  8.4 - 10.5 mg/dL   GFR calc non Af Amer 80 (*) >90 mL/min   GFR calc Af Amer >90  >90 mL/min   Comment: (NOTE)     The eGFR has been calculated using the CKD EPI equation.     This calculation has not been validated in all clinical situations.     eGFR's persistently <90 mL/min signify possible Chronic Kidney     Disease.  PROTIME-INR      Status: None   Collection Time    02/25/14  1:05 PM      Result Value Ref Range   Prothrombin Time 13.9  11.6 - 15.2 seconds   INR 1.09  0.00 - 1.49  APTT     Status: None   Collection Time    02/25/14  1:05 PM      Result Value Ref Range   aPTT 32  24 - 37 seconds    No results found.   A comprehensive review of systems was negative except for: Eyes: positive for cataracts and contacts/glasses Hematologic/lymphatic: positive for easy bruising and clots Behavioral/Psych: positive for depression and sleep disturbance  Blood pressure 105/51, pulse 58, temperature 98.3 F (36.8 C), temperature source Oral, resp. rate 14, height _0  (1.651 m), weight 87.544 kg (193 lb), SpO2 100.00%.  General appearance: alert, cooperative and appears stated age Head: Normocephalic, without obvious abnormality, atraumatic Neck: supple, symmetrical, trachea midline Resp: clear to auscultation bilaterally Cardio: regular rate and rhythm GI: non tender Extremities: non tender Pulses: 2+ and symmetric Skin: Skin color, texture, turgor normal. No rashes or lesions Neurologic: Grossly normal Incision/Wound: none  Assessment/Plan Left thumb fpl rupture and retained distal radius hardware.  Plan removal hardware and reconstruction fpl with pl graft.  Risks, benefits, and alternatives of surgery were discussed and the patient agrees with the plan of care.   KUZMA,KEVIN R 02/26/2014, 8:33 AM

## 2014-02-26 NOTE — Anesthesia Postprocedure Evaluation (Signed)
  Anesthesia Post-op Note  Patient: Grace Howard  Procedure(s) Performed: Procedure(s): LEFT FLEXOR POLLICIS LONGUS REPAIR WITH PALMARIS LONGUS GRAFT  (Left) REMOVAL HARDWARE  (Left)  Patient Location: PACU  Anesthesia Type:General and block  Level of Consciousness: awake and alert   Airway and Oxygen Therapy: Patient Spontanous Breathing  Post-op Pain: none  Post-op Assessment: Post-op Vital signs reviewed, Patient's Cardiovascular Status Stable and Respiratory Function Stable  Post-op Vital Signs: Reviewed  Filed Vitals:   02/26/14 1100  BP: 121/66  Pulse: 86  Temp:   Resp: 20    Complications: No apparent anesthesia complications

## 2014-02-26 NOTE — Progress Notes (Signed)
Assisted Dr. Fitzgerald with left, ultrasound guided, supraclavicular block. Side rails up, monitors on throughout procedure. See vital signs in flow sheet. Tolerated Procedure well. 

## 2014-02-27 ENCOUNTER — Encounter (HOSPITAL_BASED_OUTPATIENT_CLINIC_OR_DEPARTMENT_OTHER): Payer: Self-pay | Admitting: Orthopedic Surgery

## 2014-03-18 ENCOUNTER — Other Ambulatory Visit (HOSPITAL_COMMUNITY): Payer: Self-pay | Admitting: Psychiatry

## 2014-03-18 ENCOUNTER — Telehealth (HOSPITAL_COMMUNITY): Payer: Self-pay | Admitting: *Deleted

## 2014-03-18 MED ORDER — DULOXETINE HCL 60 MG PO CPEP: 60.0000 mg | ORAL_CAPSULE | Freq: Every day | ORAL | Status: DC

## 2014-03-18 NOTE — Telephone Encounter (Signed)
done

## 2014-04-01 ENCOUNTER — Other Ambulatory Visit (HOSPITAL_COMMUNITY): Payer: Self-pay | Admitting: Psychiatry

## 2014-04-12 ENCOUNTER — Other Ambulatory Visit (HOSPITAL_COMMUNITY): Payer: Self-pay | Admitting: *Deleted

## 2014-04-12 MED ORDER — DIAZEPAM 2 MG PO TABS: 2.0000 mg | ORAL_TABLET | Freq: Three times a day (TID) | ORAL | Status: DC

## 2014-04-12 NOTE — Telephone Encounter (Signed)
Per Dr. Tenny Craw to call in pt Diazepam. Spoke Dianne the Pharmacist and she states although our office received rx request, pt is not due for another rx refill until August 19th, 2015. They will hold the Rx that was phoned in until August17 and then refill.

## 2014-04-26 ENCOUNTER — Encounter (HOSPITAL_COMMUNITY): Payer: Self-pay | Admitting: Psychiatry

## 2014-04-26 ENCOUNTER — Ambulatory Visit (INDEPENDENT_AMBULATORY_CARE_PROVIDER_SITE_OTHER): Payer: Medicare Other | Admitting: Psychiatry

## 2014-04-26 VITALS — BP 152/89 | HR 120 | Ht 65.0 in

## 2014-04-26 DIAGNOSIS — F329 Major depressive disorder, single episode, unspecified: Secondary | ICD-10-CM

## 2014-04-26 DIAGNOSIS — F3289 Other specified depressive episodes: Secondary | ICD-10-CM

## 2014-04-26 DIAGNOSIS — F331 Major depressive disorder, recurrent, moderate: Secondary | ICD-10-CM

## 2014-04-26 MED ORDER — ARIPIPRAZOLE 10 MG PO TABS: 10.0000 mg | ORAL_TABLET | Freq: Every evening | ORAL | Status: DC

## 2014-04-26 MED ORDER — DIAZEPAM 2 MG PO TABS: 2.0000 mg | ORAL_TABLET | Freq: Three times a day (TID) | ORAL | Status: DC

## 2014-04-26 MED ORDER — DULOXETINE HCL 60 MG PO CPEP: 60.0000 mg | ORAL_CAPSULE | Freq: Every day | ORAL | Status: DC

## 2014-04-26 MED ORDER — ZOLPIDEM TARTRATE 10 MG PO TABS: 10.0000 mg | ORAL_TABLET | Freq: Every evening | ORAL | Status: DC | PRN

## 2014-04-26 MED ORDER — TOPIRAMATE 25 MG PO TABS
25.0000 mg | ORAL_TABLET | Freq: Every day | ORAL | Status: DC
Start: 2014-04-26 — End: 2014-11-11
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
## 2014-04-29 ENCOUNTER — Telehealth (HOSPITAL_COMMUNITY): Payer: Self-pay | Admitting: *Deleted

## 2014-04-29 NOTE — Telephone Encounter (Signed)
pt calling stating her pharmacy just made her aware that if she gets her generic Abilify for 90 days, it costs her $1,200. But if she gets it 30 days, it will be $400. So pt would like for office to send her generic Abilify to [facility] 30 days Rx only with enough refills to last her until her next appt. Pt states she is [phone]. Informed pt that I would have to discuss this with Dr. Tenny Craw before sending in new Rx tomorrow. Pt agreed.

## 2014-04-30 NOTE — Telephone Encounter (Signed)
Go ahead and send it. 

## 2014-05-01 ENCOUNTER — Other Ambulatory Visit (HOSPITAL_COMMUNITY): Payer: Self-pay | Admitting: *Deleted

## 2014-05-01 MED ORDER — ARIPIPRAZOLE 10 MG PO TABS: 10.0000 mg | ORAL_TABLET | Freq: Every evening | ORAL | Status: DC

## 2014-05-01 NOTE — Telephone Encounter (Signed)
Pt is aware of script being sent to pharmacy for 30 days. Pt shows understanding.

## 2014-05-01 NOTE — Telephone Encounter (Signed)
Fax Received. Refill Completed. [place] Howard (R.M.A)   

## 2014-06-26 ENCOUNTER — Ambulatory Visit (INDEPENDENT_AMBULATORY_CARE_PROVIDER_SITE_OTHER): Payer: Medicare Other | Admitting: Psychiatry

## 2014-06-26 ENCOUNTER — Encounter (HOSPITAL_COMMUNITY): Payer: Self-pay | Admitting: Psychiatry

## 2014-06-26 VITALS — BP 129/81 | HR 78 | Ht 65.0 in | Wt 194.0 lb

## 2014-06-26 DIAGNOSIS — F329 Major depressive disorder, single episode, unspecified: Secondary | ICD-10-CM

## 2014-06-26 DIAGNOSIS — F418 Other specified anxiety disorders: Secondary | ICD-10-CM

## 2014-06-26 DIAGNOSIS — F331 Major depressive disorder, recurrent, moderate: Secondary | ICD-10-CM

## 2014-06-26 DIAGNOSIS — F5105 Insomnia due to other mental disorder: Secondary | ICD-10-CM

## 2014-06-26 MED ORDER — DULOXETINE HCL 60 MG PO CPEP: 60.0000 mg | ORAL_CAPSULE | Freq: Every day | ORAL | Status: DC

## 2014-06-26 MED ORDER — DIAZEPAM 2 MG PO TABS: 2.0000 mg | ORAL_TABLET | Freq: Three times a day (TID) | ORAL | Status: DC

## 2014-06-26 MED ORDER — ZOLPIDEM TARTRATE 10 MG PO TABS: 10.0000 mg | ORAL_TABLET | Freq: Every evening | ORAL | Status: DC | PRN
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
## 2014-07-30 ENCOUNTER — Ambulatory Visit (INDEPENDENT_AMBULATORY_CARE_PROVIDER_SITE_OTHER): Payer: Medicare Other | Admitting: Psychiatry

## 2014-07-30 ENCOUNTER — Encounter (HOSPITAL_COMMUNITY): Payer: Self-pay | Admitting: Psychiatry

## 2014-07-30 VITALS — BP 121/71 | HR 81 | Ht 65.0 in | Wt 195.6 lb

## 2014-07-30 DIAGNOSIS — F418 Other specified anxiety disorders: Secondary | ICD-10-CM

## 2014-07-30 DIAGNOSIS — F331 Major depressive disorder, recurrent, moderate: Secondary | ICD-10-CM

## 2014-07-30 DIAGNOSIS — F5105 Insomnia due to other mental disorder: Secondary | ICD-10-CM

## 2014-07-30 DIAGNOSIS — F329 Major depressive disorder, single episode, unspecified: Secondary | ICD-10-CM
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
## 2014-08-16 ENCOUNTER — Telehealth (HOSPITAL_COMMUNITY): Payer: Self-pay | Admitting: *Deleted

## 2014-08-16 NOTE — Telephone Encounter (Signed)
patient called, she did not qualify for the [employer].

## 2014-08-19 NOTE — Telephone Encounter (Signed)
Called pt back in regards to her previous call. Per pt, she called the Oceanographer for [employer] and they informed her that she would not be qualified due to her household income being $5,000.00 over the cut off limit. Per pt, she is in the process of changing to another insurance and do not know the amount it will cost her yet. Per pt, she just wanted to inform Dr. Tenny Craw

## 2014-08-19 NOTE — Telephone Encounter (Signed)
noted 

## 2014-09-19 ENCOUNTER — Telehealth (HOSPITAL_COMMUNITY): Payer: Self-pay | Admitting: *Deleted

## 2014-09-19 ENCOUNTER — Other Ambulatory Visit (HOSPITAL_COMMUNITY): Payer: Self-pay | Admitting: *Deleted

## 2014-09-19 MED ORDER — DIAZEPAM 2 MG PO TABS: 2.0000 mg | ORAL_TABLET | Freq: Three times a day (TID) | ORAL | Status: DC

## 2014-09-19 MED ORDER — ARIPIPRAZOLE 10 MG PO TABS: 5.0000 mg | ORAL_TABLET | Freq: Every evening | ORAL | Status: DC

## 2014-09-19 MED ORDER — DULOXETINE HCL 60 MG PO CPEP: 60.0000 mg | ORAL_CAPSULE | Freq: Every day | ORAL | Status: DC

## 2014-09-19 NOTE — Telephone Encounter (Signed)
Per Dr. Tenny Craw to call in pt medications to her new pharmacy due to previous phone call. Spoke to [place] at [facility].

## 2014-09-19 NOTE — Telephone Encounter (Signed)
You can call them in. Please change pharmacy in chart

## 2014-09-19 NOTE — Telephone Encounter (Signed)
Called in pt medication requested. Spoke with Almira Coaster at [facility]

## 2014-09-19 NOTE — Telephone Encounter (Signed)
Pt new pharmacy just called stating they will be taking over filling pt medications and they are trying to bubble package it for pt. Per pharmacist, they are needing new script written for pt Abilify,Cymbalta and Diazepam. Pt pharmacy number is [phone]

## 2014-10-10 ENCOUNTER — Other Ambulatory Visit (HOSPITAL_COMMUNITY): Payer: Self-pay | Admitting: Psychiatry

## 2014-10-29 ENCOUNTER — Ambulatory Visit (HOSPITAL_COMMUNITY): Payer: Self-pay | Admitting: Psychiatry

## 2014-10-30 ENCOUNTER — Ambulatory Visit (HOSPITAL_COMMUNITY): Payer: Self-pay | Admitting: Psychiatry

## 2014-11-11 ENCOUNTER — Ambulatory Visit (INDEPENDENT_AMBULATORY_CARE_PROVIDER_SITE_OTHER): Payer: Medicare Other | Admitting: Psychiatry

## 2014-11-11 ENCOUNTER — Encounter (HOSPITAL_COMMUNITY): Payer: Self-pay | Admitting: Psychiatry

## 2014-11-11 VITALS — BP 129/79 | HR 83 | Ht 65.0 in | Wt 192.8 lb

## 2014-11-11 DIAGNOSIS — F331 Major depressive disorder, recurrent, moderate: Secondary | ICD-10-CM

## 2014-11-11 MED ORDER — TEMAZEPAM 15 MG PO CAPS: 15.0000 mg | ORAL_CAPSULE | Freq: Every evening | ORAL | Status: DC | PRN

## 2014-11-11 MED ORDER — DULOXETINE HCL 60 MG PO CPEP: 60.0000 mg | ORAL_CAPSULE | Freq: Every day | ORAL | Status: DC

## 2014-11-11 MED ORDER — ARIPIPRAZOLE 10 MG PO TABS: 5.0000 mg | ORAL_TABLET | Freq: Every evening | ORAL | Status: DC
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
## 2015-02-11 ENCOUNTER — Encounter (HOSPITAL_COMMUNITY): Payer: Self-pay | Admitting: Psychiatry

## 2015-02-11 ENCOUNTER — Ambulatory Visit (INDEPENDENT_AMBULATORY_CARE_PROVIDER_SITE_OTHER): Payer: Medicare Other | Admitting: Psychiatry

## 2015-02-11 VITALS — BP 122/72 | HR 93 | Ht 65.0 in | Wt 203.8 lb

## 2015-02-11 DIAGNOSIS — F329 Major depressive disorder, single episode, unspecified: Secondary | ICD-10-CM | POA: Diagnosis not present

## 2015-02-11 DIAGNOSIS — F331 Major depressive disorder, recurrent, moderate: Secondary | ICD-10-CM

## 2015-02-11 MED ORDER — DULOXETINE HCL 60 MG PO CPEP: 60.0000 mg | ORAL_CAPSULE | Freq: Every day | ORAL | Status: DC

## 2015-02-11 MED ORDER — ARIPIPRAZOLE 10 MG PO TABS: 5.0000 mg | ORAL_TABLET | Freq: Every evening | ORAL | Status: DC

## 2015-02-11 MED ORDER — TRAZODONE HCL 150 MG PO TABS: 150.0000 mg | ORAL_TABLET | Freq: Every day | ORAL | Status: DC

## 2015-02-11 MED ORDER — DIAZEPAM 2 MG PO TABS: 2.0000 mg | ORAL_TABLET | Freq: Three times a day (TID) | ORAL | Status: DC
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
## 2015-02-13 ENCOUNTER — Telehealth (HOSPITAL_COMMUNITY): Payer: Self-pay | Admitting: *Deleted

## 2015-02-13 NOTE — Telephone Encounter (Signed)
voice message from patient regarding her Diazepam, stated not at pharmacy.

## 2015-02-14 ENCOUNTER — Telehealth (HOSPITAL_COMMUNITY): Payer: Self-pay | Admitting: *Deleted

## 2015-02-14 MED ORDER — DIAZEPAM 2 MG PO TABS: 2.0000 mg | ORAL_TABLET | Freq: Three times a day (TID) | ORAL | Status: DC

## 2015-02-14 NOTE — Telephone Encounter (Signed)
Called in pt Diazepam and spoke with Florentina Addison and she showed understanding.

## 2015-02-14 NOTE — Telephone Encounter (Signed)
Will call in script for pt when pt pharmacy opens

## 2015-02-14 NOTE — Telephone Encounter (Signed)
Per Dr. Tenny Craw to call in pt Valium when pt last came into office due to her not wanting script to get lost. Called pt pharmacy and spoke with Florentina Addison and per Florentina Addison, she will get pt script ready for her.

## 2015-04-16 ENCOUNTER — Other Ambulatory Visit (HOSPITAL_COMMUNITY): Payer: Self-pay | Admitting: Specialist

## 2015-04-16 DIAGNOSIS — G8929 Other chronic pain: Secondary | ICD-10-CM

## 2015-04-30 ENCOUNTER — Encounter (HOSPITAL_COMMUNITY): Payer: Medicare Other

## 2015-05-08 ENCOUNTER — Other Ambulatory Visit (HOSPITAL_COMMUNITY): Payer: Self-pay | Admitting: Psychiatry

## 2015-05-13 ENCOUNTER — Other Ambulatory Visit (HOSPITAL_COMMUNITY): Payer: Self-pay | Admitting: Psychiatry

## 2015-05-14 ENCOUNTER — Ambulatory Visit (HOSPITAL_COMMUNITY): Payer: Self-pay | Admitting: Psychiatry

## 2015-05-19 ENCOUNTER — Other Ambulatory Visit (HOSPITAL_COMMUNITY): Payer: Self-pay | Admitting: Psychiatry

## 2015-05-19 ENCOUNTER — Telehealth (HOSPITAL_COMMUNITY): Payer: Self-pay | Admitting: *Deleted

## 2015-05-19 MED ORDER — DIAZEPAM 2 MG PO TABS: 2.0000 mg | ORAL_TABLET | Freq: Three times a day (TID) | ORAL | Status: DC

## 2015-05-19 MED ORDER — TRAZODONE HCL 150 MG PO TABS: 150.0000 mg | ORAL_TABLET | Freq: Every day | ORAL | Status: DC

## 2015-05-19 NOTE — Telephone Encounter (Signed)
Per Dr. Tenny Craw to call in script for pt. Pt medication was called in. Spoke with Bonita Quin the pharmacist and she will get meds ready for pt.

## 2015-05-19 NOTE — Telephone Encounter (Signed)
Sent trazodone. Please call in valium 30 days

## 2015-05-19 NOTE — Telephone Encounter (Signed)
Pt pharmacy requesting refills for pt Diazepam and Trazodone. Pt medications were last filled 02-11-15. Pt f/u appt is scheduled for 06-10-15. Pharmacy number is [phone].

## 2015-05-19 NOTE — Telephone Encounter (Signed)
done

## 2015-06-10 ENCOUNTER — Encounter (HOSPITAL_COMMUNITY): Payer: Self-pay | Admitting: Psychiatry

## 2015-06-10 ENCOUNTER — Ambulatory Visit (INDEPENDENT_AMBULATORY_CARE_PROVIDER_SITE_OTHER): Payer: Medicare Other | Admitting: Psychiatry

## 2015-06-10 VITALS — BP 139/83 | HR 86 | Ht 65.0 in | Wt 204.0 lb

## 2015-06-10 DIAGNOSIS — F331 Major depressive disorder, recurrent, moderate: Secondary | ICD-10-CM | POA: Diagnosis not present

## 2015-06-10 MED ORDER — ARIPIPRAZOLE 10 MG PO TABS: 5.0000 mg | ORAL_TABLET | Freq: Every evening | ORAL | Status: DC

## 2015-06-10 MED ORDER — DULOXETINE HCL 60 MG PO CPEP: 60.0000 mg | ORAL_CAPSULE | Freq: Every day | ORAL | Status: DC

## 2015-06-10 MED ORDER — DIAZEPAM 2 MG PO TABS: 2.0000 mg | ORAL_TABLET | Freq: Two times a day (BID) | ORAL | Status: DC | PRN
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
## 2015-06-19 ENCOUNTER — Telehealth (HOSPITAL_COMMUNITY): Payer: Self-pay | Admitting: *Deleted

## 2015-06-19 NOTE — Telephone Encounter (Signed)
Pt pharmacy requesting refills for her Diazepam. Called pharmacy and spoke with Morrie Sheldon and informed her that pt was given a printed script on 06-10-15. Per Morrie Sheldon, they do not have that script yet. Informed Morrie Sheldon that office will call pt and see if she still have that script and see if she can bring it to them. Called pt and informed her that pharmacy requesting her medication and per pt, she just got her license and she was going to drive herself to the pharmacy and give script to them. Per pt she agreed to call pharmacy and inform them that she still have her script. Per pt, she did not know pharmacy needed script.

## 2015-09-26 DIAGNOSIS — I2699 Other pulmonary embolism without acute cor pulmonale: Secondary | ICD-10-CM | POA: Diagnosis not present

## 2015-09-29 DIAGNOSIS — M7062 Trochanteric bursitis, left hip: Secondary | ICD-10-CM | POA: Diagnosis not present

## 2015-09-29 DIAGNOSIS — M1712 Unilateral primary osteoarthritis, left knee: Secondary | ICD-10-CM | POA: Diagnosis not present

## 2015-09-29 DIAGNOSIS — M1711 Unilateral primary osteoarthritis, right knee: Secondary | ICD-10-CM | POA: Diagnosis not present

## 2015-09-29 DIAGNOSIS — M170 Bilateral primary osteoarthritis of knee: Secondary | ICD-10-CM | POA: Diagnosis not present

## 2015-10-03 DIAGNOSIS — M3210 Systemic lupus erythematosus, organ or system involvement unspecified: Secondary | ICD-10-CM | POA: Diagnosis not present

## 2015-10-03 DIAGNOSIS — H353131 Nonexudative age-related macular degeneration, bilateral, early dry stage: Secondary | ICD-10-CM | POA: Diagnosis not present

## 2015-10-03 DIAGNOSIS — Z79899 Other long term (current) drug therapy: Secondary | ICD-10-CM | POA: Diagnosis not present

## 2015-10-06 ENCOUNTER — Other Ambulatory Visit (HOSPITAL_COMMUNITY): Payer: Self-pay | Admitting: Psychiatry

## 2015-10-09 ENCOUNTER — Ambulatory Visit (INDEPENDENT_AMBULATORY_CARE_PROVIDER_SITE_OTHER): Payer: Medicare Other | Admitting: Psychiatry

## 2015-10-09 ENCOUNTER — Encounter (HOSPITAL_COMMUNITY): Payer: Self-pay | Admitting: Psychiatry

## 2015-10-09 VITALS — BP 154/84 | HR 81 | Ht 65.0 in | Wt 198.8 lb

## 2015-10-09 DIAGNOSIS — F331 Major depressive disorder, recurrent, moderate: Secondary | ICD-10-CM

## 2015-10-09 MED ORDER — TRAZODONE HCL 150 MG PO TABS: 150.0000 mg | ORAL_TABLET | Freq: Every evening | ORAL | Status: DC | PRN

## 2015-10-09 MED ORDER — DULOXETINE HCL 60 MG PO CPEP: 60.0000 mg | ORAL_CAPSULE | Freq: Every day | ORAL | Status: DC

## 2015-10-09 MED ORDER — ARIPIPRAZOLE 10 MG PO TABS: 10.0000 mg | ORAL_TABLET | Freq: Every day | ORAL | Status: DC

## 2015-10-09 MED ORDER — DIAZEPAM 2 MG PO TABS: 2.0000 mg | ORAL_TABLET | Freq: Two times a day (BID) | ORAL | Status: DC | PRN
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
## 2015-10-16 ENCOUNTER — Telehealth (HOSPITAL_COMMUNITY): Payer: Self-pay | Admitting: *Deleted

## 2015-10-16 NOTE — Telephone Encounter (Signed)
Pt called stating when she took her printed script for her Abilify, they told her that her pay after insurance is $321. Per pt, she can not afford to get medication and she does not have any tablets left. Per per, she did not fill medication with [facility]. Per pt she took her script and will be trying with another pharmacy. Per pt, she already called her insurance to request Tier Reduction. Office informed her to provide her new pharmacy (which she did not disclosed) with script and if she needs Prior Auth office will have the lady that does them to call her insurance. Per pt, her issue is if it is safe for her to go without her Abilify while she waits. Pt number is [phone].

## 2015-10-16 NOTE — Telephone Encounter (Signed)
It won't cause withdrawal, but her mood might worsen

## 2015-10-17 NOTE — Telephone Encounter (Signed)
Called pt and informed her of what Dr. Tenny Craw stated. Per pt, she went and got it filled last night at [facility] due to them charging the least for her medication. Per pt, she pain $127.00 for 120 tablets. Per pt, her insurance is [place].

## 2015-10-24 DIAGNOSIS — M1990 Unspecified osteoarthritis, unspecified site: Secondary | ICD-10-CM | POA: Diagnosis not present

## 2015-10-24 DIAGNOSIS — I2699 Other pulmonary embolism without acute cor pulmonale: Secondary | ICD-10-CM | POA: Diagnosis not present

## 2015-11-18 DIAGNOSIS — Z79899 Other long term (current) drug therapy: Secondary | ICD-10-CM | POA: Diagnosis not present

## 2015-11-18 DIAGNOSIS — R300 Dysuria: Secondary | ICD-10-CM | POA: Diagnosis not present

## 2015-11-18 DIAGNOSIS — M2550 Pain in unspecified joint: Secondary | ICD-10-CM | POA: Diagnosis not present

## 2015-11-20 DIAGNOSIS — Z09 Encounter for follow-up examination after completed treatment for conditions other than malignant neoplasm: Secondary | ICD-10-CM | POA: Diagnosis not present

## 2015-11-20 DIAGNOSIS — M25561 Pain in right knee: Secondary | ICD-10-CM | POA: Diagnosis not present

## 2015-11-20 DIAGNOSIS — M3219 Other organ or system involvement in systemic lupus erythematosus: Secondary | ICD-10-CM | POA: Diagnosis not present

## 2015-11-20 DIAGNOSIS — M19241 Secondary osteoarthritis, right hand: Secondary | ICD-10-CM | POA: Diagnosis not present

## 2015-11-21 DIAGNOSIS — M545 Low back pain: Secondary | ICD-10-CM | POA: Diagnosis not present

## 2015-11-21 DIAGNOSIS — I2699 Other pulmonary embolism without acute cor pulmonale: Secondary | ICD-10-CM | POA: Diagnosis not present

## 2015-11-21 DIAGNOSIS — R300 Dysuria: Secondary | ICD-10-CM | POA: Diagnosis not present

## 2015-12-08 ENCOUNTER — Other Ambulatory Visit (HOSPITAL_COMMUNITY): Payer: Self-pay | Admitting: Psychiatry

## 2015-12-10 DIAGNOSIS — M170 Bilateral primary osteoarthritis of knee: Secondary | ICD-10-CM | POA: Diagnosis not present

## 2015-12-23 DIAGNOSIS — M545 Low back pain: Secondary | ICD-10-CM | POA: Diagnosis not present

## 2015-12-23 DIAGNOSIS — I2699 Other pulmonary embolism without acute cor pulmonale: Secondary | ICD-10-CM | POA: Diagnosis not present

## 2016-01-13 ENCOUNTER — Ambulatory Visit (INDEPENDENT_AMBULATORY_CARE_PROVIDER_SITE_OTHER): Payer: Medicare Other | Admitting: Psychiatry

## 2016-01-13 ENCOUNTER — Encounter (HOSPITAL_COMMUNITY): Payer: Self-pay | Admitting: Psychiatry

## 2016-01-13 VITALS — BP 142/86 | HR 80 | Ht 65.0 in | Wt 198.2 lb

## 2016-01-13 DIAGNOSIS — F341 Dysthymic disorder: Secondary | ICD-10-CM | POA: Diagnosis not present

## 2016-01-13 DIAGNOSIS — F331 Major depressive disorder, recurrent, moderate: Secondary | ICD-10-CM

## 2016-01-13 DIAGNOSIS — F5105 Insomnia due to other mental disorder: Secondary | ICD-10-CM

## 2016-01-13 DIAGNOSIS — F418 Other specified anxiety disorders: Secondary | ICD-10-CM

## 2016-01-13 MED ORDER — ARIPIPRAZOLE 10 MG PO TABS: 10.0000 mg | ORAL_TABLET | Freq: Every day | ORAL | Status: DC

## 2016-01-13 MED ORDER — DIAZEPAM 2 MG PO TABS
2.0000 mg | ORAL_TABLET | Freq: Two times a day (BID) | ORAL | Status: DC | PRN
Start: 2016-01-13 — End: 2016-04-19

## 2016-01-13 MED ORDER — HYDROXYZINE HCL 50 MG PO TABS: 50.0000 mg | ORAL_TABLET | Freq: Every day | ORAL | Status: DC

## 2016-01-13 MED ORDER — DULOXETINE HCL 60 MG PO CPEP: 60.0000 mg | ORAL_CAPSULE | Freq: Two times a day (BID) | ORAL | Status: DC
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
## 2016-01-14 ENCOUNTER — Telehealth (HOSPITAL_COMMUNITY): Payer: Self-pay | Admitting: *Deleted

## 2016-01-14 NOTE — Telephone Encounter (Signed)
Prior authorization for Hydroxyzine received. Called spoke with Georgette Dover who states that response will be faxed within 72 hours. Reference #[id number].

## 2016-01-14 NOTE — Telephone Encounter (Signed)
noted 

## 2016-01-19 ENCOUNTER — Telehealth (HOSPITAL_COMMUNITY): Payer: Self-pay | Admitting: *Deleted

## 2016-01-19 NOTE — Telephone Encounter (Signed)
Aripiprazole, Ref # [id number]. regarding tier reduction.

## 2016-01-20 ENCOUNTER — Telehealth (HOSPITAL_COMMUNITY): Payer: Self-pay | Admitting: *Deleted

## 2016-01-20 NOTE — Telephone Encounter (Signed)
Shawn from pt insurance [employer] called to ask question about pt requesting a generic for Abilify. Per Ines Bloomer she would like to know the mg for medication, quantity, SIG and if this medication is new or continuing care. Per Ines Bloomer she would like to know if this medication diagnosis is for bipolar or schizophrenia? Informed Shawn per pt chart, pt was dx to take this medication for depression and augmentation. Called pt to verify the reason provider put her on Abilify and she stated depression and denied the other two insurance is questioning about. Shawn then asked if pt tried any other medication other then the one she is requesting generic for. Shawn asked if pt tried Geodon, Olanzapine, or Risperidone? Called pt and asked her but she could not remember. Per Ines Bloomer, that is all she have to ask

## 2016-01-20 NOTE — Telephone Encounter (Signed)
completed

## 2016-01-21 DIAGNOSIS — I2699 Other pulmonary embolism without acute cor pulmonale: Secondary | ICD-10-CM | POA: Diagnosis not present

## 2016-01-21 DIAGNOSIS — M545 Low back pain: Secondary | ICD-10-CM | POA: Diagnosis not present

## 2016-01-21 NOTE — Telephone Encounter (Signed)
Called pt to inform her that her insurance sent an approval letter to office stating pt was approved for her medication for Tier 3. Per pt, is Tier 1 the most expensive? Informed pt that office do not know that information and for her to contact her insurance and ask them which Tier is most expensive and pt verbalized understanding. Per pt, she hopes the Tier 3 is much cheaper then the Tier she was previously on.

## 2016-01-28 DIAGNOSIS — I4891 Unspecified atrial fibrillation: Secondary | ICD-10-CM | POA: Diagnosis not present

## 2016-01-28 DIAGNOSIS — I10 Essential (primary) hypertension: Secondary | ICD-10-CM | POA: Diagnosis not present

## 2016-01-28 DIAGNOSIS — I2510 Atherosclerotic heart disease of native coronary artery without angina pectoris: Secondary | ICD-10-CM | POA: Diagnosis not present

## 2016-01-28 DIAGNOSIS — M159 Polyosteoarthritis, unspecified: Secondary | ICD-10-CM | POA: Diagnosis not present

## 2016-02-04 DIAGNOSIS — H10503 Unspecified blepharoconjunctivitis, bilateral: Secondary | ICD-10-CM | POA: Diagnosis not present

## 2016-02-04 DIAGNOSIS — H00029 Hordeolum internum unspecified eye, unspecified eyelid: Secondary | ICD-10-CM | POA: Diagnosis not present

## 2016-02-05 DIAGNOSIS — S62616A Displaced fracture of proximal phalanx of right little finger, initial encounter for closed fracture: Secondary | ICD-10-CM | POA: Diagnosis not present

## 2016-02-05 DIAGNOSIS — Z86711 Personal history of pulmonary embolism: Secondary | ICD-10-CM | POA: Diagnosis not present

## 2016-02-05 DIAGNOSIS — E041 Nontoxic single thyroid nodule: Secondary | ICD-10-CM | POA: Diagnosis not present

## 2016-02-05 DIAGNOSIS — T148 Other injury of unspecified body region: Secondary | ICD-10-CM | POA: Diagnosis not present

## 2016-02-05 DIAGNOSIS — S0093XA Contusion of unspecified part of head, initial encounter: Secondary | ICD-10-CM | POA: Diagnosis not present

## 2016-02-05 DIAGNOSIS — W1849XA Other slipping, tripping and stumbling without falling, initial encounter: Secondary | ICD-10-CM | POA: Diagnosis not present

## 2016-02-05 DIAGNOSIS — M25562 Pain in left knee: Secondary | ICD-10-CM | POA: Diagnosis not present

## 2016-02-05 DIAGNOSIS — Z79899 Other long term (current) drug therapy: Secondary | ICD-10-CM | POA: Diagnosis not present

## 2016-02-05 DIAGNOSIS — K219 Gastro-esophageal reflux disease without esophagitis: Secondary | ICD-10-CM | POA: Diagnosis not present

## 2016-02-05 DIAGNOSIS — M25462 Effusion, left knee: Secondary | ICD-10-CM | POA: Diagnosis not present

## 2016-02-05 DIAGNOSIS — M79646 Pain in unspecified finger(s): Secondary | ICD-10-CM | POA: Diagnosis not present

## 2016-02-05 DIAGNOSIS — S8992XA Unspecified injury of left lower leg, initial encounter: Secondary | ICD-10-CM | POA: Diagnosis not present

## 2016-02-05 DIAGNOSIS — S199XXA Unspecified injury of neck, initial encounter: Secondary | ICD-10-CM | POA: Diagnosis not present

## 2016-02-05 DIAGNOSIS — R229 Localized swelling, mass and lump, unspecified: Secondary | ICD-10-CM | POA: Diagnosis not present

## 2016-02-05 DIAGNOSIS — S0990XA Unspecified injury of head, initial encounter: Secondary | ICD-10-CM | POA: Diagnosis not present

## 2016-02-05 DIAGNOSIS — S139XXA Sprain of joints and ligaments of unspecified parts of neck, initial encounter: Secondary | ICD-10-CM | POA: Diagnosis not present

## 2016-02-05 DIAGNOSIS — S8392XA Sprain of unspecified site of left knee, initial encounter: Secondary | ICD-10-CM | POA: Diagnosis not present

## 2016-02-05 DIAGNOSIS — Z87891 Personal history of nicotine dependence: Secondary | ICD-10-CM | POA: Diagnosis not present

## 2016-02-05 DIAGNOSIS — I10 Essential (primary) hypertension: Secondary | ICD-10-CM | POA: Diagnosis not present

## 2016-02-05 DIAGNOSIS — M329 Systemic lupus erythematosus, unspecified: Secondary | ICD-10-CM | POA: Diagnosis not present

## 2016-02-05 DIAGNOSIS — S134XXA Sprain of ligaments of cervical spine, initial encounter: Secondary | ICD-10-CM | POA: Diagnosis not present

## 2016-02-05 DIAGNOSIS — Z7901 Long term (current) use of anticoagulants: Secondary | ICD-10-CM | POA: Diagnosis not present

## 2016-02-06 ENCOUNTER — Ambulatory Visit (HOSPITAL_COMMUNITY): Payer: Self-pay | Admitting: Psychiatry

## 2016-02-06 DIAGNOSIS — S62616A Displaced fracture of proximal phalanx of right little finger, initial encounter for closed fracture: Secondary | ICD-10-CM | POA: Diagnosis not present

## 2016-02-06 DIAGNOSIS — M898X6 Other specified disorders of bone, lower leg: Secondary | ICD-10-CM | POA: Diagnosis not present

## 2016-02-06 DIAGNOSIS — S82192A Other fracture of upper end of left tibia, initial encounter for closed fracture: Secondary | ICD-10-CM | POA: Diagnosis not present

## 2016-02-06 DIAGNOSIS — M79644 Pain in right finger(s): Secondary | ICD-10-CM | POA: Diagnosis not present

## 2016-02-07 DIAGNOSIS — F419 Anxiety disorder, unspecified: Secondary | ICD-10-CM | POA: Diagnosis not present

## 2016-02-07 DIAGNOSIS — Z86711 Personal history of pulmonary embolism: Secondary | ICD-10-CM | POA: Diagnosis not present

## 2016-02-07 DIAGNOSIS — Z79899 Other long term (current) drug therapy: Secondary | ICD-10-CM | POA: Diagnosis not present

## 2016-02-07 DIAGNOSIS — F410 Panic disorder [episodic paroxysmal anxiety] without agoraphobia: Secondary | ICD-10-CM | POA: Diagnosis not present

## 2016-02-07 DIAGNOSIS — Z87891 Personal history of nicotine dependence: Secondary | ICD-10-CM | POA: Diagnosis not present

## 2016-02-07 DIAGNOSIS — M1990 Unspecified osteoarthritis, unspecified site: Secondary | ICD-10-CM | POA: Diagnosis not present

## 2016-02-07 DIAGNOSIS — K219 Gastro-esophageal reflux disease without esophagitis: Secondary | ICD-10-CM | POA: Diagnosis not present

## 2016-02-07 DIAGNOSIS — M25562 Pain in left knee: Secondary | ICD-10-CM | POA: Diagnosis not present

## 2016-02-07 DIAGNOSIS — F329 Major depressive disorder, single episode, unspecified: Secondary | ICD-10-CM | POA: Diagnosis not present

## 2016-02-07 DIAGNOSIS — S82202A Unspecified fracture of shaft of left tibia, initial encounter for closed fracture: Secondary | ICD-10-CM | POA: Diagnosis not present

## 2016-02-07 DIAGNOSIS — R569 Unspecified convulsions: Secondary | ICD-10-CM | POA: Diagnosis not present

## 2016-02-07 DIAGNOSIS — I10 Essential (primary) hypertension: Secondary | ICD-10-CM | POA: Diagnosis not present

## 2016-02-07 DIAGNOSIS — S82142A Displaced bicondylar fracture of left tibia, initial encounter for closed fracture: Secondary | ICD-10-CM | POA: Diagnosis not present

## 2016-02-11 DIAGNOSIS — Z87891 Personal history of nicotine dependence: Secondary | ICD-10-CM | POA: Diagnosis not present

## 2016-02-11 DIAGNOSIS — S82209A Unspecified fracture of shaft of unspecified tibia, initial encounter for closed fracture: Secondary | ICD-10-CM | POA: Diagnosis not present

## 2016-02-11 DIAGNOSIS — Z6831 Body mass index (BMI) 31.0-31.9, adult: Secondary | ICD-10-CM | POA: Diagnosis not present

## 2016-02-11 DIAGNOSIS — Z713 Dietary counseling and surveillance: Secondary | ICD-10-CM | POA: Diagnosis not present

## 2016-02-11 DIAGNOSIS — Z299 Encounter for prophylactic measures, unspecified: Secondary | ICD-10-CM | POA: Diagnosis not present

## 2016-02-13 DIAGNOSIS — E785 Hyperlipidemia, unspecified: Secondary | ICD-10-CM | POA: Diagnosis not present

## 2016-02-13 DIAGNOSIS — F329 Major depressive disorder, single episode, unspecified: Secondary | ICD-10-CM | POA: Diagnosis not present

## 2016-02-13 DIAGNOSIS — Z7901 Long term (current) use of anticoagulants: Secondary | ICD-10-CM | POA: Diagnosis not present

## 2016-02-13 DIAGNOSIS — W19XXXD Unspecified fall, subsequent encounter: Secondary | ICD-10-CM | POA: Diagnosis not present

## 2016-02-13 DIAGNOSIS — M1990 Unspecified osteoarthritis, unspecified site: Secondary | ICD-10-CM | POA: Diagnosis not present

## 2016-02-13 DIAGNOSIS — I4891 Unspecified atrial fibrillation: Secondary | ICD-10-CM | POA: Diagnosis not present

## 2016-02-13 DIAGNOSIS — S82252D Displaced comminuted fracture of shaft of left tibia, subsequent encounter for closed fracture with routine healing: Secondary | ICD-10-CM | POA: Diagnosis not present

## 2016-02-13 DIAGNOSIS — Z5181 Encounter for therapeutic drug level monitoring: Secondary | ICD-10-CM | POA: Diagnosis not present

## 2016-02-13 DIAGNOSIS — I10 Essential (primary) hypertension: Secondary | ICD-10-CM | POA: Diagnosis not present

## 2016-02-13 DIAGNOSIS — K219 Gastro-esophageal reflux disease without esophagitis: Secondary | ICD-10-CM | POA: Diagnosis not present

## 2016-02-13 DIAGNOSIS — I2782 Chronic pulmonary embolism: Secondary | ICD-10-CM | POA: Diagnosis not present

## 2016-02-13 DIAGNOSIS — M810 Age-related osteoporosis without current pathological fracture: Secondary | ICD-10-CM | POA: Diagnosis not present

## 2016-02-13 DIAGNOSIS — I2510 Atherosclerotic heart disease of native coronary artery without angina pectoris: Secondary | ICD-10-CM | POA: Diagnosis not present

## 2016-02-16 DIAGNOSIS — M1990 Unspecified osteoarthritis, unspecified site: Secondary | ICD-10-CM | POA: Diagnosis not present

## 2016-02-16 DIAGNOSIS — I4891 Unspecified atrial fibrillation: Secondary | ICD-10-CM | POA: Diagnosis not present

## 2016-02-16 DIAGNOSIS — I2510 Atherosclerotic heart disease of native coronary artery without angina pectoris: Secondary | ICD-10-CM | POA: Diagnosis not present

## 2016-02-16 DIAGNOSIS — S82252D Displaced comminuted fracture of shaft of left tibia, subsequent encounter for closed fracture with routine healing: Secondary | ICD-10-CM | POA: Diagnosis not present

## 2016-02-16 DIAGNOSIS — I2782 Chronic pulmonary embolism: Secondary | ICD-10-CM | POA: Diagnosis not present

## 2016-02-16 DIAGNOSIS — M810 Age-related osteoporosis without current pathological fracture: Secondary | ICD-10-CM | POA: Diagnosis not present

## 2016-02-17 DIAGNOSIS — I4891 Unspecified atrial fibrillation: Secondary | ICD-10-CM | POA: Diagnosis not present

## 2016-02-17 DIAGNOSIS — I2510 Atherosclerotic heart disease of native coronary artery without angina pectoris: Secondary | ICD-10-CM | POA: Diagnosis not present

## 2016-02-17 DIAGNOSIS — M810 Age-related osteoporosis without current pathological fracture: Secondary | ICD-10-CM | POA: Diagnosis not present

## 2016-02-17 DIAGNOSIS — I2782 Chronic pulmonary embolism: Secondary | ICD-10-CM | POA: Diagnosis not present

## 2016-02-17 DIAGNOSIS — S82252D Displaced comminuted fracture of shaft of left tibia, subsequent encounter for closed fracture with routine healing: Secondary | ICD-10-CM | POA: Diagnosis not present

## 2016-02-17 DIAGNOSIS — M1990 Unspecified osteoarthritis, unspecified site: Secondary | ICD-10-CM | POA: Diagnosis not present

## 2016-02-19 DIAGNOSIS — M810 Age-related osteoporosis without current pathological fracture: Secondary | ICD-10-CM | POA: Diagnosis not present

## 2016-02-19 DIAGNOSIS — S82252D Displaced comminuted fracture of shaft of left tibia, subsequent encounter for closed fracture with routine healing: Secondary | ICD-10-CM | POA: Diagnosis not present

## 2016-02-19 DIAGNOSIS — M1990 Unspecified osteoarthritis, unspecified site: Secondary | ICD-10-CM | POA: Diagnosis not present

## 2016-02-19 DIAGNOSIS — I4891 Unspecified atrial fibrillation: Secondary | ICD-10-CM | POA: Diagnosis not present

## 2016-02-19 DIAGNOSIS — I2782 Chronic pulmonary embolism: Secondary | ICD-10-CM | POA: Diagnosis not present

## 2016-02-19 DIAGNOSIS — I2510 Atherosclerotic heart disease of native coronary artery without angina pectoris: Secondary | ICD-10-CM | POA: Diagnosis not present

## 2016-02-22 DIAGNOSIS — M810 Age-related osteoporosis without current pathological fracture: Secondary | ICD-10-CM | POA: Diagnosis not present

## 2016-02-22 DIAGNOSIS — I2782 Chronic pulmonary embolism: Secondary | ICD-10-CM | POA: Diagnosis not present

## 2016-02-22 DIAGNOSIS — M1990 Unspecified osteoarthritis, unspecified site: Secondary | ICD-10-CM | POA: Diagnosis not present

## 2016-02-22 DIAGNOSIS — I2510 Atherosclerotic heart disease of native coronary artery without angina pectoris: Secondary | ICD-10-CM | POA: Diagnosis not present

## 2016-02-22 DIAGNOSIS — I4891 Unspecified atrial fibrillation: Secondary | ICD-10-CM | POA: Diagnosis not present

## 2016-02-22 DIAGNOSIS — S82252D Displaced comminuted fracture of shaft of left tibia, subsequent encounter for closed fracture with routine healing: Secondary | ICD-10-CM | POA: Diagnosis not present

## 2016-02-23 DIAGNOSIS — M810 Age-related osteoporosis without current pathological fracture: Secondary | ICD-10-CM | POA: Diagnosis not present

## 2016-02-23 DIAGNOSIS — S82252D Displaced comminuted fracture of shaft of left tibia, subsequent encounter for closed fracture with routine healing: Secondary | ICD-10-CM | POA: Diagnosis not present

## 2016-02-23 DIAGNOSIS — I2782 Chronic pulmonary embolism: Secondary | ICD-10-CM | POA: Diagnosis not present

## 2016-02-23 DIAGNOSIS — I4891 Unspecified atrial fibrillation: Secondary | ICD-10-CM | POA: Diagnosis not present

## 2016-02-23 DIAGNOSIS — I2510 Atherosclerotic heart disease of native coronary artery without angina pectoris: Secondary | ICD-10-CM | POA: Diagnosis not present

## 2016-02-23 DIAGNOSIS — M1990 Unspecified osteoarthritis, unspecified site: Secondary | ICD-10-CM | POA: Diagnosis not present

## 2016-02-24 ENCOUNTER — Ambulatory Visit (HOSPITAL_COMMUNITY): Payer: Self-pay | Admitting: Psychiatry

## 2016-02-24 DIAGNOSIS — I2699 Other pulmonary embolism without acute cor pulmonale: Secondary | ICD-10-CM | POA: Diagnosis not present

## 2016-02-24 DIAGNOSIS — M329 Systemic lupus erythematosus, unspecified: Secondary | ICD-10-CM | POA: Diagnosis not present

## 2016-02-24 DIAGNOSIS — S82209A Unspecified fracture of shaft of unspecified tibia, initial encounter for closed fracture: Secondary | ICD-10-CM | POA: Diagnosis not present

## 2016-02-24 DIAGNOSIS — M810 Age-related osteoporosis without current pathological fracture: Secondary | ICD-10-CM | POA: Diagnosis not present

## 2016-02-25 DIAGNOSIS — I2782 Chronic pulmonary embolism: Secondary | ICD-10-CM | POA: Diagnosis not present

## 2016-02-25 DIAGNOSIS — M1990 Unspecified osteoarthritis, unspecified site: Secondary | ICD-10-CM | POA: Diagnosis not present

## 2016-02-25 DIAGNOSIS — I2510 Atherosclerotic heart disease of native coronary artery without angina pectoris: Secondary | ICD-10-CM | POA: Diagnosis not present

## 2016-02-25 DIAGNOSIS — M810 Age-related osteoporosis without current pathological fracture: Secondary | ICD-10-CM | POA: Diagnosis not present

## 2016-02-25 DIAGNOSIS — S82252D Displaced comminuted fracture of shaft of left tibia, subsequent encounter for closed fracture with routine healing: Secondary | ICD-10-CM | POA: Diagnosis not present

## 2016-02-25 DIAGNOSIS — I4891 Unspecified atrial fibrillation: Secondary | ICD-10-CM | POA: Diagnosis not present

## 2016-02-26 DIAGNOSIS — M1990 Unspecified osteoarthritis, unspecified site: Secondary | ICD-10-CM | POA: Diagnosis not present

## 2016-02-26 DIAGNOSIS — I4891 Unspecified atrial fibrillation: Secondary | ICD-10-CM | POA: Diagnosis not present

## 2016-02-26 DIAGNOSIS — S82252D Displaced comminuted fracture of shaft of left tibia, subsequent encounter for closed fracture with routine healing: Secondary | ICD-10-CM | POA: Diagnosis not present

## 2016-02-26 DIAGNOSIS — M810 Age-related osteoporosis without current pathological fracture: Secondary | ICD-10-CM | POA: Diagnosis not present

## 2016-02-26 DIAGNOSIS — I2510 Atherosclerotic heart disease of native coronary artery without angina pectoris: Secondary | ICD-10-CM | POA: Diagnosis not present

## 2016-02-26 DIAGNOSIS — I2782 Chronic pulmonary embolism: Secondary | ICD-10-CM | POA: Diagnosis not present

## 2016-02-27 DIAGNOSIS — S62616A Displaced fracture of proximal phalanx of right little finger, initial encounter for closed fracture: Secondary | ICD-10-CM | POA: Diagnosis not present

## 2016-02-27 DIAGNOSIS — S82192D Other fracture of upper end of left tibia, subsequent encounter for closed fracture with routine healing: Secondary | ICD-10-CM | POA: Diagnosis not present

## 2016-03-02 DIAGNOSIS — S82252D Displaced comminuted fracture of shaft of left tibia, subsequent encounter for closed fracture with routine healing: Secondary | ICD-10-CM | POA: Diagnosis not present

## 2016-03-02 DIAGNOSIS — I2782 Chronic pulmonary embolism: Secondary | ICD-10-CM | POA: Diagnosis not present

## 2016-03-02 DIAGNOSIS — I4891 Unspecified atrial fibrillation: Secondary | ICD-10-CM | POA: Diagnosis not present

## 2016-03-02 DIAGNOSIS — M810 Age-related osteoporosis without current pathological fracture: Secondary | ICD-10-CM | POA: Diagnosis not present

## 2016-03-02 DIAGNOSIS — M1990 Unspecified osteoarthritis, unspecified site: Secondary | ICD-10-CM | POA: Diagnosis not present

## 2016-03-02 DIAGNOSIS — I2510 Atherosclerotic heart disease of native coronary artery without angina pectoris: Secondary | ICD-10-CM | POA: Diagnosis not present

## 2016-03-05 DIAGNOSIS — I4891 Unspecified atrial fibrillation: Secondary | ICD-10-CM | POA: Diagnosis not present

## 2016-03-05 DIAGNOSIS — I2782 Chronic pulmonary embolism: Secondary | ICD-10-CM | POA: Diagnosis not present

## 2016-03-05 DIAGNOSIS — M1990 Unspecified osteoarthritis, unspecified site: Secondary | ICD-10-CM | POA: Diagnosis not present

## 2016-03-05 DIAGNOSIS — M810 Age-related osteoporosis without current pathological fracture: Secondary | ICD-10-CM | POA: Diagnosis not present

## 2016-03-05 DIAGNOSIS — I2510 Atherosclerotic heart disease of native coronary artery without angina pectoris: Secondary | ICD-10-CM | POA: Diagnosis not present

## 2016-03-05 DIAGNOSIS — S82252D Displaced comminuted fracture of shaft of left tibia, subsequent encounter for closed fracture with routine healing: Secondary | ICD-10-CM | POA: Diagnosis not present

## 2016-03-08 DIAGNOSIS — I4891 Unspecified atrial fibrillation: Secondary | ICD-10-CM | POA: Diagnosis not present

## 2016-03-08 DIAGNOSIS — S82252D Displaced comminuted fracture of shaft of left tibia, subsequent encounter for closed fracture with routine healing: Secondary | ICD-10-CM | POA: Diagnosis not present

## 2016-03-08 DIAGNOSIS — M810 Age-related osteoporosis without current pathological fracture: Secondary | ICD-10-CM | POA: Diagnosis not present

## 2016-03-08 DIAGNOSIS — M1990 Unspecified osteoarthritis, unspecified site: Secondary | ICD-10-CM | POA: Diagnosis not present

## 2016-03-08 DIAGNOSIS — I2510 Atherosclerotic heart disease of native coronary artery without angina pectoris: Secondary | ICD-10-CM | POA: Diagnosis not present

## 2016-03-08 DIAGNOSIS — I2782 Chronic pulmonary embolism: Secondary | ICD-10-CM | POA: Diagnosis not present

## 2016-03-10 DIAGNOSIS — M1990 Unspecified osteoarthritis, unspecified site: Secondary | ICD-10-CM | POA: Diagnosis not present

## 2016-03-10 DIAGNOSIS — I2510 Atherosclerotic heart disease of native coronary artery without angina pectoris: Secondary | ICD-10-CM | POA: Diagnosis not present

## 2016-03-10 DIAGNOSIS — M810 Age-related osteoporosis without current pathological fracture: Secondary | ICD-10-CM | POA: Diagnosis not present

## 2016-03-10 DIAGNOSIS — S82252D Displaced comminuted fracture of shaft of left tibia, subsequent encounter for closed fracture with routine healing: Secondary | ICD-10-CM | POA: Diagnosis not present

## 2016-03-10 DIAGNOSIS — I2782 Chronic pulmonary embolism: Secondary | ICD-10-CM | POA: Diagnosis not present

## 2016-03-10 DIAGNOSIS — I4891 Unspecified atrial fibrillation: Secondary | ICD-10-CM | POA: Diagnosis not present

## 2016-03-15 DIAGNOSIS — I2510 Atherosclerotic heart disease of native coronary artery without angina pectoris: Secondary | ICD-10-CM | POA: Diagnosis not present

## 2016-03-15 DIAGNOSIS — S82252D Displaced comminuted fracture of shaft of left tibia, subsequent encounter for closed fracture with routine healing: Secondary | ICD-10-CM | POA: Diagnosis not present

## 2016-03-15 DIAGNOSIS — I2782 Chronic pulmonary embolism: Secondary | ICD-10-CM | POA: Diagnosis not present

## 2016-03-15 DIAGNOSIS — I4891 Unspecified atrial fibrillation: Secondary | ICD-10-CM | POA: Diagnosis not present

## 2016-03-15 DIAGNOSIS — M810 Age-related osteoporosis without current pathological fracture: Secondary | ICD-10-CM | POA: Diagnosis not present

## 2016-03-15 DIAGNOSIS — M1990 Unspecified osteoarthritis, unspecified site: Secondary | ICD-10-CM | POA: Diagnosis not present

## 2016-03-16 DIAGNOSIS — M1990 Unspecified osteoarthritis, unspecified site: Secondary | ICD-10-CM | POA: Diagnosis not present

## 2016-03-16 DIAGNOSIS — I2510 Atherosclerotic heart disease of native coronary artery without angina pectoris: Secondary | ICD-10-CM | POA: Diagnosis not present

## 2016-03-16 DIAGNOSIS — S82252D Displaced comminuted fracture of shaft of left tibia, subsequent encounter for closed fracture with routine healing: Secondary | ICD-10-CM | POA: Diagnosis not present

## 2016-03-16 DIAGNOSIS — I4891 Unspecified atrial fibrillation: Secondary | ICD-10-CM | POA: Diagnosis not present

## 2016-03-16 DIAGNOSIS — I2782 Chronic pulmonary embolism: Secondary | ICD-10-CM | POA: Diagnosis not present

## 2016-03-16 DIAGNOSIS — M810 Age-related osteoporosis without current pathological fracture: Secondary | ICD-10-CM | POA: Diagnosis not present

## 2016-03-17 DIAGNOSIS — I2782 Chronic pulmonary embolism: Secondary | ICD-10-CM | POA: Diagnosis not present

## 2016-03-17 DIAGNOSIS — I2510 Atherosclerotic heart disease of native coronary artery without angina pectoris: Secondary | ICD-10-CM | POA: Diagnosis not present

## 2016-03-17 DIAGNOSIS — M1990 Unspecified osteoarthritis, unspecified site: Secondary | ICD-10-CM | POA: Diagnosis not present

## 2016-03-17 DIAGNOSIS — M810 Age-related osteoporosis without current pathological fracture: Secondary | ICD-10-CM | POA: Diagnosis not present

## 2016-03-17 DIAGNOSIS — S82252D Displaced comminuted fracture of shaft of left tibia, subsequent encounter for closed fracture with routine healing: Secondary | ICD-10-CM | POA: Diagnosis not present

## 2016-03-17 DIAGNOSIS — I4891 Unspecified atrial fibrillation: Secondary | ICD-10-CM | POA: Diagnosis not present

## 2016-03-19 DIAGNOSIS — M810 Age-related osteoporosis without current pathological fracture: Secondary | ICD-10-CM | POA: Diagnosis not present

## 2016-03-19 DIAGNOSIS — M1990 Unspecified osteoarthritis, unspecified site: Secondary | ICD-10-CM | POA: Diagnosis not present

## 2016-03-19 DIAGNOSIS — I2510 Atherosclerotic heart disease of native coronary artery without angina pectoris: Secondary | ICD-10-CM | POA: Diagnosis not present

## 2016-03-19 DIAGNOSIS — I4891 Unspecified atrial fibrillation: Secondary | ICD-10-CM | POA: Diagnosis not present

## 2016-03-19 DIAGNOSIS — S82252D Displaced comminuted fracture of shaft of left tibia, subsequent encounter for closed fracture with routine healing: Secondary | ICD-10-CM | POA: Diagnosis not present

## 2016-03-19 DIAGNOSIS — I2782 Chronic pulmonary embolism: Secondary | ICD-10-CM | POA: Diagnosis not present

## 2016-03-22 DIAGNOSIS — I2782 Chronic pulmonary embolism: Secondary | ICD-10-CM | POA: Diagnosis not present

## 2016-03-22 DIAGNOSIS — M810 Age-related osteoporosis without current pathological fracture: Secondary | ICD-10-CM | POA: Diagnosis not present

## 2016-03-22 DIAGNOSIS — I2510 Atherosclerotic heart disease of native coronary artery without angina pectoris: Secondary | ICD-10-CM | POA: Diagnosis not present

## 2016-03-22 DIAGNOSIS — S82252D Displaced comminuted fracture of shaft of left tibia, subsequent encounter for closed fracture with routine healing: Secondary | ICD-10-CM | POA: Diagnosis not present

## 2016-03-22 DIAGNOSIS — I4891 Unspecified atrial fibrillation: Secondary | ICD-10-CM | POA: Diagnosis not present

## 2016-03-22 DIAGNOSIS — M1990 Unspecified osteoarthritis, unspecified site: Secondary | ICD-10-CM | POA: Diagnosis not present

## 2016-03-23 DIAGNOSIS — M545 Low back pain: Secondary | ICD-10-CM | POA: Diagnosis not present

## 2016-03-23 DIAGNOSIS — I2699 Other pulmonary embolism without acute cor pulmonale: Secondary | ICD-10-CM | POA: Diagnosis not present

## 2016-03-24 DIAGNOSIS — M810 Age-related osteoporosis without current pathological fracture: Secondary | ICD-10-CM | POA: Diagnosis not present

## 2016-03-24 DIAGNOSIS — I2782 Chronic pulmonary embolism: Secondary | ICD-10-CM | POA: Diagnosis not present

## 2016-03-24 DIAGNOSIS — I4891 Unspecified atrial fibrillation: Secondary | ICD-10-CM | POA: Diagnosis not present

## 2016-03-24 DIAGNOSIS — I2510 Atherosclerotic heart disease of native coronary artery without angina pectoris: Secondary | ICD-10-CM | POA: Diagnosis not present

## 2016-03-24 DIAGNOSIS — S82252D Displaced comminuted fracture of shaft of left tibia, subsequent encounter for closed fracture with routine healing: Secondary | ICD-10-CM | POA: Diagnosis not present

## 2016-03-24 DIAGNOSIS — M1990 Unspecified osteoarthritis, unspecified site: Secondary | ICD-10-CM | POA: Diagnosis not present

## 2016-03-24 DIAGNOSIS — M1712 Unilateral primary osteoarthritis, left knee: Secondary | ICD-10-CM | POA: Diagnosis not present

## 2016-03-25 DIAGNOSIS — I10 Essential (primary) hypertension: Secondary | ICD-10-CM | POA: Diagnosis not present

## 2016-03-25 DIAGNOSIS — M159 Polyosteoarthritis, unspecified: Secondary | ICD-10-CM | POA: Diagnosis not present

## 2016-03-25 DIAGNOSIS — I4891 Unspecified atrial fibrillation: Secondary | ICD-10-CM | POA: Diagnosis not present

## 2016-03-25 DIAGNOSIS — I2510 Atherosclerotic heart disease of native coronary artery without angina pectoris: Secondary | ICD-10-CM | POA: Diagnosis not present

## 2016-03-29 DIAGNOSIS — M810 Age-related osteoporosis without current pathological fracture: Secondary | ICD-10-CM | POA: Diagnosis not present

## 2016-03-29 DIAGNOSIS — I2782 Chronic pulmonary embolism: Secondary | ICD-10-CM | POA: Diagnosis not present

## 2016-03-29 DIAGNOSIS — I2510 Atherosclerotic heart disease of native coronary artery without angina pectoris: Secondary | ICD-10-CM | POA: Diagnosis not present

## 2016-03-29 DIAGNOSIS — S82252D Displaced comminuted fracture of shaft of left tibia, subsequent encounter for closed fracture with routine healing: Secondary | ICD-10-CM | POA: Diagnosis not present

## 2016-03-29 DIAGNOSIS — I4891 Unspecified atrial fibrillation: Secondary | ICD-10-CM | POA: Diagnosis not present

## 2016-03-29 DIAGNOSIS — M1990 Unspecified osteoarthritis, unspecified site: Secondary | ICD-10-CM | POA: Diagnosis not present

## 2016-03-31 DIAGNOSIS — S82492A Other fracture of shaft of left fibula, initial encounter for closed fracture: Secondary | ICD-10-CM | POA: Diagnosis not present

## 2016-04-01 DIAGNOSIS — I2782 Chronic pulmonary embolism: Secondary | ICD-10-CM | POA: Diagnosis not present

## 2016-04-01 DIAGNOSIS — M1990 Unspecified osteoarthritis, unspecified site: Secondary | ICD-10-CM | POA: Diagnosis not present

## 2016-04-01 DIAGNOSIS — S82252D Displaced comminuted fracture of shaft of left tibia, subsequent encounter for closed fracture with routine healing: Secondary | ICD-10-CM | POA: Diagnosis not present

## 2016-04-01 DIAGNOSIS — M810 Age-related osteoporosis without current pathological fracture: Secondary | ICD-10-CM | POA: Diagnosis not present

## 2016-04-01 DIAGNOSIS — I4891 Unspecified atrial fibrillation: Secondary | ICD-10-CM | POA: Diagnosis not present

## 2016-04-01 DIAGNOSIS — I2510 Atherosclerotic heart disease of native coronary artery without angina pectoris: Secondary | ICD-10-CM | POA: Diagnosis not present

## 2016-04-02 ENCOUNTER — Telehealth (HOSPITAL_COMMUNITY): Payer: Self-pay | Admitting: *Deleted

## 2016-04-02 NOTE — Telephone Encounter (Signed)
return phone call, no answer, left voice message.

## 2016-04-02 NOTE — Telephone Encounter (Signed)
phone call from patient, just wants Dr. Tenny Craw to know that she broke her leg and still not getting around very well.   As soon as she is better she will call for an appointment.

## 2016-04-05 NOTE — Telephone Encounter (Signed)
noted 

## 2016-04-07 DIAGNOSIS — I4891 Unspecified atrial fibrillation: Secondary | ICD-10-CM | POA: Diagnosis not present

## 2016-04-07 DIAGNOSIS — M1990 Unspecified osteoarthritis, unspecified site: Secondary | ICD-10-CM | POA: Diagnosis not present

## 2016-04-07 DIAGNOSIS — I2782 Chronic pulmonary embolism: Secondary | ICD-10-CM | POA: Diagnosis not present

## 2016-04-07 DIAGNOSIS — Z299 Encounter for prophylactic measures, unspecified: Secondary | ICD-10-CM | POA: Diagnosis not present

## 2016-04-07 DIAGNOSIS — M810 Age-related osteoporosis without current pathological fracture: Secondary | ICD-10-CM | POA: Diagnosis not present

## 2016-04-07 DIAGNOSIS — I2510 Atherosclerotic heart disease of native coronary artery without angina pectoris: Secondary | ICD-10-CM | POA: Diagnosis not present

## 2016-04-07 DIAGNOSIS — S82252D Displaced comminuted fracture of shaft of left tibia, subsequent encounter for closed fracture with routine healing: Secondary | ICD-10-CM | POA: Diagnosis not present

## 2016-04-07 DIAGNOSIS — I2699 Other pulmonary embolism without acute cor pulmonale: Secondary | ICD-10-CM | POA: Diagnosis not present

## 2016-04-09 DIAGNOSIS — I2510 Atherosclerotic heart disease of native coronary artery without angina pectoris: Secondary | ICD-10-CM | POA: Diagnosis not present

## 2016-04-09 DIAGNOSIS — S82252D Displaced comminuted fracture of shaft of left tibia, subsequent encounter for closed fracture with routine healing: Secondary | ICD-10-CM | POA: Diagnosis not present

## 2016-04-09 DIAGNOSIS — I4891 Unspecified atrial fibrillation: Secondary | ICD-10-CM | POA: Diagnosis not present

## 2016-04-09 DIAGNOSIS — I2782 Chronic pulmonary embolism: Secondary | ICD-10-CM | POA: Diagnosis not present

## 2016-04-09 DIAGNOSIS — M810 Age-related osteoporosis without current pathological fracture: Secondary | ICD-10-CM | POA: Diagnosis not present

## 2016-04-09 DIAGNOSIS — M1990 Unspecified osteoarthritis, unspecified site: Secondary | ICD-10-CM | POA: Diagnosis not present

## 2016-04-13 DIAGNOSIS — S82252D Displaced comminuted fracture of shaft of left tibia, subsequent encounter for closed fracture with routine healing: Secondary | ICD-10-CM | POA: Diagnosis not present

## 2016-04-13 DIAGNOSIS — W19XXXD Unspecified fall, subsequent encounter: Secondary | ICD-10-CM | POA: Diagnosis not present

## 2016-04-13 DIAGNOSIS — K219 Gastro-esophageal reflux disease without esophagitis: Secondary | ICD-10-CM | POA: Diagnosis not present

## 2016-04-13 DIAGNOSIS — M1990 Unspecified osteoarthritis, unspecified site: Secondary | ICD-10-CM | POA: Diagnosis not present

## 2016-04-13 DIAGNOSIS — I10 Essential (primary) hypertension: Secondary | ICD-10-CM | POA: Diagnosis not present

## 2016-04-13 DIAGNOSIS — I2782 Chronic pulmonary embolism: Secondary | ICD-10-CM | POA: Diagnosis not present

## 2016-04-13 DIAGNOSIS — I2510 Atherosclerotic heart disease of native coronary artery without angina pectoris: Secondary | ICD-10-CM | POA: Diagnosis not present

## 2016-04-13 DIAGNOSIS — M810 Age-related osteoporosis without current pathological fracture: Secondary | ICD-10-CM | POA: Diagnosis not present

## 2016-04-13 DIAGNOSIS — E785 Hyperlipidemia, unspecified: Secondary | ICD-10-CM | POA: Diagnosis not present

## 2016-04-13 DIAGNOSIS — M159 Polyosteoarthritis, unspecified: Secondary | ICD-10-CM | POA: Diagnosis not present

## 2016-04-13 DIAGNOSIS — F329 Major depressive disorder, single episode, unspecified: Secondary | ICD-10-CM | POA: Diagnosis not present

## 2016-04-13 DIAGNOSIS — I4891 Unspecified atrial fibrillation: Secondary | ICD-10-CM | POA: Diagnosis not present

## 2016-04-13 DIAGNOSIS — Z7901 Long term (current) use of anticoagulants: Secondary | ICD-10-CM | POA: Diagnosis not present

## 2016-04-13 DIAGNOSIS — Z5181 Encounter for therapeutic drug level monitoring: Secondary | ICD-10-CM | POA: Diagnosis not present

## 2016-04-14 DIAGNOSIS — S82492D Other fracture of shaft of left fibula, subsequent encounter for closed fracture with routine healing: Secondary | ICD-10-CM | POA: Diagnosis not present

## 2016-04-19 ENCOUNTER — Encounter (HOSPITAL_COMMUNITY): Payer: Self-pay | Admitting: Psychiatry

## 2016-04-19 ENCOUNTER — Ambulatory Visit (INDEPENDENT_AMBULATORY_CARE_PROVIDER_SITE_OTHER): Payer: Medicare Other | Admitting: Psychiatry

## 2016-04-19 VITALS — BP 125/78 | HR 93 | Ht 65.0 in | Wt 202.4 lb

## 2016-04-19 DIAGNOSIS — F331 Major depressive disorder, recurrent, moderate: Secondary | ICD-10-CM

## 2016-04-19 MED ORDER — ARIPIPRAZOLE 10 MG PO TABS
10.0000 mg | ORAL_TABLET | Freq: Every day | ORAL | 3 refills | Status: DC
Start: 2016-04-19 — End: 2016-06-29

## 2016-04-19 MED ORDER — ARIPIPRAZOLE 10 MG PO TABS: 10.0000 mg | ORAL_TABLET | Freq: Every day | ORAL | 3 refills | Status: DC

## 2016-04-19 MED ORDER — HYDROXYZINE HCL 50 MG PO TABS
50.0000 mg | ORAL_TABLET | Freq: Every day | ORAL | 2 refills | Status: DC
Start: 2016-04-19 — End: 2016-06-29

## 2016-04-19 MED ORDER — DULOXETINE HCL 60 MG PO CPEP: 60.0000 mg | ORAL_CAPSULE | Freq: Two times a day (BID) | ORAL | 3 refills | Status: DC

## 2016-04-19 MED ORDER — DIAZEPAM 2 MG PO TABS: 2.0000 mg | ORAL_TABLET | Freq: Two times a day (BID) | ORAL | 3 refills | Status: DC | PRN
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
## 2016-04-21 DIAGNOSIS — S82252D Displaced comminuted fracture of shaft of left tibia, subsequent encounter for closed fracture with routine healing: Secondary | ICD-10-CM | POA: Diagnosis not present

## 2016-04-21 DIAGNOSIS — I2510 Atherosclerotic heart disease of native coronary artery without angina pectoris: Secondary | ICD-10-CM | POA: Diagnosis not present

## 2016-04-21 DIAGNOSIS — I2782 Chronic pulmonary embolism: Secondary | ICD-10-CM | POA: Diagnosis not present

## 2016-04-21 DIAGNOSIS — I4891 Unspecified atrial fibrillation: Secondary | ICD-10-CM | POA: Diagnosis not present

## 2016-04-21 DIAGNOSIS — M1990 Unspecified osteoarthritis, unspecified site: Secondary | ICD-10-CM | POA: Diagnosis not present

## 2016-04-21 DIAGNOSIS — M810 Age-related osteoporosis without current pathological fracture: Secondary | ICD-10-CM | POA: Diagnosis not present

## 2016-04-23 DIAGNOSIS — M810 Age-related osteoporosis without current pathological fracture: Secondary | ICD-10-CM | POA: Diagnosis not present

## 2016-04-23 DIAGNOSIS — I4891 Unspecified atrial fibrillation: Secondary | ICD-10-CM | POA: Diagnosis not present

## 2016-04-23 DIAGNOSIS — M1990 Unspecified osteoarthritis, unspecified site: Secondary | ICD-10-CM | POA: Diagnosis not present

## 2016-04-23 DIAGNOSIS — I2510 Atherosclerotic heart disease of native coronary artery without angina pectoris: Secondary | ICD-10-CM | POA: Diagnosis not present

## 2016-04-23 DIAGNOSIS — S82252D Displaced comminuted fracture of shaft of left tibia, subsequent encounter for closed fracture with routine healing: Secondary | ICD-10-CM | POA: Diagnosis not present

## 2016-04-23 DIAGNOSIS — I2782 Chronic pulmonary embolism: Secondary | ICD-10-CM | POA: Diagnosis not present

## 2016-04-26 DIAGNOSIS — M1990 Unspecified osteoarthritis, unspecified site: Secondary | ICD-10-CM | POA: Diagnosis not present

## 2016-04-26 DIAGNOSIS — I2510 Atherosclerotic heart disease of native coronary artery without angina pectoris: Secondary | ICD-10-CM | POA: Diagnosis not present

## 2016-04-26 DIAGNOSIS — I2782 Chronic pulmonary embolism: Secondary | ICD-10-CM | POA: Diagnosis not present

## 2016-04-26 DIAGNOSIS — M810 Age-related osteoporosis without current pathological fracture: Secondary | ICD-10-CM | POA: Diagnosis not present

## 2016-04-26 DIAGNOSIS — S82252D Displaced comminuted fracture of shaft of left tibia, subsequent encounter for closed fracture with routine healing: Secondary | ICD-10-CM | POA: Diagnosis not present

## 2016-04-26 DIAGNOSIS — I4891 Unspecified atrial fibrillation: Secondary | ICD-10-CM | POA: Diagnosis not present

## 2016-04-30 DIAGNOSIS — M1990 Unspecified osteoarthritis, unspecified site: Secondary | ICD-10-CM | POA: Diagnosis not present

## 2016-04-30 DIAGNOSIS — S82252D Displaced comminuted fracture of shaft of left tibia, subsequent encounter for closed fracture with routine healing: Secondary | ICD-10-CM | POA: Diagnosis not present

## 2016-04-30 DIAGNOSIS — I2510 Atherosclerotic heart disease of native coronary artery without angina pectoris: Secondary | ICD-10-CM | POA: Diagnosis not present

## 2016-04-30 DIAGNOSIS — I2699 Other pulmonary embolism without acute cor pulmonale: Secondary | ICD-10-CM | POA: Diagnosis not present

## 2016-04-30 DIAGNOSIS — I4891 Unspecified atrial fibrillation: Secondary | ICD-10-CM | POA: Diagnosis not present

## 2016-04-30 DIAGNOSIS — M545 Low back pain: Secondary | ICD-10-CM | POA: Diagnosis not present

## 2016-04-30 DIAGNOSIS — M810 Age-related osteoporosis without current pathological fracture: Secondary | ICD-10-CM | POA: Diagnosis not present

## 2016-04-30 DIAGNOSIS — I2782 Chronic pulmonary embolism: Secondary | ICD-10-CM | POA: Diagnosis not present

## 2016-05-03 DIAGNOSIS — I2510 Atherosclerotic heart disease of native coronary artery without angina pectoris: Secondary | ICD-10-CM | POA: Diagnosis not present

## 2016-05-03 DIAGNOSIS — M1990 Unspecified osteoarthritis, unspecified site: Secondary | ICD-10-CM | POA: Diagnosis not present

## 2016-05-03 DIAGNOSIS — I4891 Unspecified atrial fibrillation: Secondary | ICD-10-CM | POA: Diagnosis not present

## 2016-05-03 DIAGNOSIS — S82252D Displaced comminuted fracture of shaft of left tibia, subsequent encounter for closed fracture with routine healing: Secondary | ICD-10-CM | POA: Diagnosis not present

## 2016-05-03 DIAGNOSIS — M810 Age-related osteoporosis without current pathological fracture: Secondary | ICD-10-CM | POA: Diagnosis not present

## 2016-05-03 DIAGNOSIS — I2782 Chronic pulmonary embolism: Secondary | ICD-10-CM | POA: Diagnosis not present

## 2016-05-06 DIAGNOSIS — I4891 Unspecified atrial fibrillation: Secondary | ICD-10-CM | POA: Diagnosis not present

## 2016-05-06 DIAGNOSIS — I2510 Atherosclerotic heart disease of native coronary artery without angina pectoris: Secondary | ICD-10-CM | POA: Diagnosis not present

## 2016-05-06 DIAGNOSIS — M1990 Unspecified osteoarthritis, unspecified site: Secondary | ICD-10-CM | POA: Diagnosis not present

## 2016-05-06 DIAGNOSIS — I2782 Chronic pulmonary embolism: Secondary | ICD-10-CM | POA: Diagnosis not present

## 2016-05-06 DIAGNOSIS — S82252D Displaced comminuted fracture of shaft of left tibia, subsequent encounter for closed fracture with routine healing: Secondary | ICD-10-CM | POA: Diagnosis not present

## 2016-05-06 DIAGNOSIS — M810 Age-related osteoporosis without current pathological fracture: Secondary | ICD-10-CM | POA: Diagnosis not present

## 2016-05-11 DIAGNOSIS — S82252D Displaced comminuted fracture of shaft of left tibia, subsequent encounter for closed fracture with routine healing: Secondary | ICD-10-CM | POA: Diagnosis not present

## 2016-05-11 DIAGNOSIS — M1990 Unspecified osteoarthritis, unspecified site: Secondary | ICD-10-CM | POA: Diagnosis not present

## 2016-05-11 DIAGNOSIS — M810 Age-related osteoporosis without current pathological fracture: Secondary | ICD-10-CM | POA: Diagnosis not present

## 2016-05-11 DIAGNOSIS — I2510 Atherosclerotic heart disease of native coronary artery without angina pectoris: Secondary | ICD-10-CM | POA: Diagnosis not present

## 2016-05-11 DIAGNOSIS — I2782 Chronic pulmonary embolism: Secondary | ICD-10-CM | POA: Diagnosis not present

## 2016-05-11 DIAGNOSIS — I4891 Unspecified atrial fibrillation: Secondary | ICD-10-CM | POA: Diagnosis not present

## 2016-05-13 DIAGNOSIS — I2510 Atherosclerotic heart disease of native coronary artery without angina pectoris: Secondary | ICD-10-CM | POA: Diagnosis not present

## 2016-05-13 DIAGNOSIS — S82252D Displaced comminuted fracture of shaft of left tibia, subsequent encounter for closed fracture with routine healing: Secondary | ICD-10-CM | POA: Diagnosis not present

## 2016-05-13 DIAGNOSIS — M810 Age-related osteoporosis without current pathological fracture: Secondary | ICD-10-CM | POA: Diagnosis not present

## 2016-05-13 DIAGNOSIS — I4891 Unspecified atrial fibrillation: Secondary | ICD-10-CM | POA: Diagnosis not present

## 2016-05-13 DIAGNOSIS — M1990 Unspecified osteoarthritis, unspecified site: Secondary | ICD-10-CM | POA: Diagnosis not present

## 2016-05-13 DIAGNOSIS — I2782 Chronic pulmonary embolism: Secondary | ICD-10-CM | POA: Diagnosis not present

## 2016-05-19 DIAGNOSIS — I2510 Atherosclerotic heart disease of native coronary artery without angina pectoris: Secondary | ICD-10-CM | POA: Diagnosis not present

## 2016-05-19 DIAGNOSIS — S82252D Displaced comminuted fracture of shaft of left tibia, subsequent encounter for closed fracture with routine healing: Secondary | ICD-10-CM | POA: Diagnosis not present

## 2016-05-19 DIAGNOSIS — I4891 Unspecified atrial fibrillation: Secondary | ICD-10-CM | POA: Diagnosis not present

## 2016-05-19 DIAGNOSIS — M810 Age-related osteoporosis without current pathological fracture: Secondary | ICD-10-CM | POA: Diagnosis not present

## 2016-05-19 DIAGNOSIS — M1990 Unspecified osteoarthritis, unspecified site: Secondary | ICD-10-CM | POA: Diagnosis not present

## 2016-05-19 DIAGNOSIS — I2782 Chronic pulmonary embolism: Secondary | ICD-10-CM | POA: Diagnosis not present

## 2016-05-21 DIAGNOSIS — M810 Age-related osteoporosis without current pathological fracture: Secondary | ICD-10-CM | POA: Diagnosis not present

## 2016-05-21 DIAGNOSIS — I4891 Unspecified atrial fibrillation: Secondary | ICD-10-CM | POA: Diagnosis not present

## 2016-05-21 DIAGNOSIS — I2782 Chronic pulmonary embolism: Secondary | ICD-10-CM | POA: Diagnosis not present

## 2016-05-21 DIAGNOSIS — I2510 Atherosclerotic heart disease of native coronary artery without angina pectoris: Secondary | ICD-10-CM | POA: Diagnosis not present

## 2016-05-21 DIAGNOSIS — S82252D Displaced comminuted fracture of shaft of left tibia, subsequent encounter for closed fracture with routine healing: Secondary | ICD-10-CM | POA: Diagnosis not present

## 2016-05-21 DIAGNOSIS — M1990 Unspecified osteoarthritis, unspecified site: Secondary | ICD-10-CM | POA: Diagnosis not present

## 2016-05-25 DIAGNOSIS — M1990 Unspecified osteoarthritis, unspecified site: Secondary | ICD-10-CM | POA: Diagnosis not present

## 2016-05-25 DIAGNOSIS — M810 Age-related osteoporosis without current pathological fracture: Secondary | ICD-10-CM | POA: Diagnosis not present

## 2016-05-25 DIAGNOSIS — I2510 Atherosclerotic heart disease of native coronary artery without angina pectoris: Secondary | ICD-10-CM | POA: Diagnosis not present

## 2016-05-25 DIAGNOSIS — I2782 Chronic pulmonary embolism: Secondary | ICD-10-CM | POA: Diagnosis not present

## 2016-05-25 DIAGNOSIS — I4891 Unspecified atrial fibrillation: Secondary | ICD-10-CM | POA: Diagnosis not present

## 2016-05-25 DIAGNOSIS — S82252D Displaced comminuted fracture of shaft of left tibia, subsequent encounter for closed fracture with routine healing: Secondary | ICD-10-CM | POA: Diagnosis not present

## 2016-05-27 DIAGNOSIS — I2782 Chronic pulmonary embolism: Secondary | ICD-10-CM | POA: Diagnosis not present

## 2016-05-27 DIAGNOSIS — I2510 Atherosclerotic heart disease of native coronary artery without angina pectoris: Secondary | ICD-10-CM | POA: Diagnosis not present

## 2016-05-27 DIAGNOSIS — I4891 Unspecified atrial fibrillation: Secondary | ICD-10-CM | POA: Diagnosis not present

## 2016-05-27 DIAGNOSIS — S82252D Displaced comminuted fracture of shaft of left tibia, subsequent encounter for closed fracture with routine healing: Secondary | ICD-10-CM | POA: Diagnosis not present

## 2016-05-27 DIAGNOSIS — M810 Age-related osteoporosis without current pathological fracture: Secondary | ICD-10-CM | POA: Diagnosis not present

## 2016-05-27 DIAGNOSIS — M1990 Unspecified osteoarthritis, unspecified site: Secondary | ICD-10-CM | POA: Diagnosis not present

## 2016-06-01 DIAGNOSIS — M810 Age-related osteoporosis without current pathological fracture: Secondary | ICD-10-CM | POA: Diagnosis not present

## 2016-06-01 DIAGNOSIS — I4891 Unspecified atrial fibrillation: Secondary | ICD-10-CM | POA: Diagnosis not present

## 2016-06-01 DIAGNOSIS — I2510 Atherosclerotic heart disease of native coronary artery without angina pectoris: Secondary | ICD-10-CM | POA: Diagnosis not present

## 2016-06-01 DIAGNOSIS — S82252D Displaced comminuted fracture of shaft of left tibia, subsequent encounter for closed fracture with routine healing: Secondary | ICD-10-CM | POA: Diagnosis not present

## 2016-06-01 DIAGNOSIS — M1990 Unspecified osteoarthritis, unspecified site: Secondary | ICD-10-CM | POA: Diagnosis not present

## 2016-06-01 DIAGNOSIS — I2782 Chronic pulmonary embolism: Secondary | ICD-10-CM | POA: Diagnosis not present

## 2016-06-03 DIAGNOSIS — I2782 Chronic pulmonary embolism: Secondary | ICD-10-CM | POA: Diagnosis not present

## 2016-06-03 DIAGNOSIS — I2510 Atherosclerotic heart disease of native coronary artery without angina pectoris: Secondary | ICD-10-CM | POA: Diagnosis not present

## 2016-06-03 DIAGNOSIS — M810 Age-related osteoporosis without current pathological fracture: Secondary | ICD-10-CM | POA: Diagnosis not present

## 2016-06-03 DIAGNOSIS — I4891 Unspecified atrial fibrillation: Secondary | ICD-10-CM | POA: Diagnosis not present

## 2016-06-03 DIAGNOSIS — S82252D Displaced comminuted fracture of shaft of left tibia, subsequent encounter for closed fracture with routine healing: Secondary | ICD-10-CM | POA: Diagnosis not present

## 2016-06-03 DIAGNOSIS — M1990 Unspecified osteoarthritis, unspecified site: Secondary | ICD-10-CM | POA: Diagnosis not present

## 2016-06-08 DIAGNOSIS — M1990 Unspecified osteoarthritis, unspecified site: Secondary | ICD-10-CM | POA: Diagnosis not present

## 2016-06-08 DIAGNOSIS — M329 Systemic lupus erythematosus, unspecified: Secondary | ICD-10-CM | POA: Diagnosis not present

## 2016-06-08 DIAGNOSIS — M545 Low back pain: Secondary | ICD-10-CM | POA: Diagnosis not present

## 2016-06-08 DIAGNOSIS — I2510 Atherosclerotic heart disease of native coronary artery without angina pectoris: Secondary | ICD-10-CM | POA: Diagnosis not present

## 2016-06-08 DIAGNOSIS — I2699 Other pulmonary embolism without acute cor pulmonale: Secondary | ICD-10-CM | POA: Diagnosis not present

## 2016-06-08 DIAGNOSIS — I4891 Unspecified atrial fibrillation: Secondary | ICD-10-CM | POA: Diagnosis not present

## 2016-06-08 DIAGNOSIS — M810 Age-related osteoporosis without current pathological fracture: Secondary | ICD-10-CM | POA: Diagnosis not present

## 2016-06-08 DIAGNOSIS — S82252D Displaced comminuted fracture of shaft of left tibia, subsequent encounter for closed fracture with routine healing: Secondary | ICD-10-CM | POA: Diagnosis not present

## 2016-06-08 DIAGNOSIS — I2782 Chronic pulmonary embolism: Secondary | ICD-10-CM | POA: Diagnosis not present

## 2016-06-09 ENCOUNTER — Ambulatory Visit (INDEPENDENT_AMBULATORY_CARE_PROVIDER_SITE_OTHER): Payer: Medicare Other | Admitting: Orthopaedic Surgery

## 2016-06-09 DIAGNOSIS — M1712 Unilateral primary osteoarthritis, left knee: Secondary | ICD-10-CM

## 2016-06-10 DIAGNOSIS — M1990 Unspecified osteoarthritis, unspecified site: Secondary | ICD-10-CM | POA: Diagnosis not present

## 2016-06-10 DIAGNOSIS — I4891 Unspecified atrial fibrillation: Secondary | ICD-10-CM | POA: Diagnosis not present

## 2016-06-10 DIAGNOSIS — I2510 Atherosclerotic heart disease of native coronary artery without angina pectoris: Secondary | ICD-10-CM | POA: Diagnosis not present

## 2016-06-10 DIAGNOSIS — M810 Age-related osteoporosis without current pathological fracture: Secondary | ICD-10-CM | POA: Diagnosis not present

## 2016-06-10 DIAGNOSIS — I2782 Chronic pulmonary embolism: Secondary | ICD-10-CM | POA: Diagnosis not present

## 2016-06-10 DIAGNOSIS — S82252D Displaced comminuted fracture of shaft of left tibia, subsequent encounter for closed fracture with routine healing: Secondary | ICD-10-CM | POA: Diagnosis not present

## 2016-06-12 DIAGNOSIS — Z7901 Long term (current) use of anticoagulants: Secondary | ICD-10-CM | POA: Diagnosis not present

## 2016-06-12 DIAGNOSIS — I10 Essential (primary) hypertension: Secondary | ICD-10-CM | POA: Diagnosis not present

## 2016-06-12 DIAGNOSIS — F329 Major depressive disorder, single episode, unspecified: Secondary | ICD-10-CM | POA: Diagnosis not present

## 2016-06-12 DIAGNOSIS — I2782 Chronic pulmonary embolism: Secondary | ICD-10-CM | POA: Diagnosis not present

## 2016-06-12 DIAGNOSIS — E785 Hyperlipidemia, unspecified: Secondary | ICD-10-CM | POA: Diagnosis not present

## 2016-06-12 DIAGNOSIS — W19XXXD Unspecified fall, subsequent encounter: Secondary | ICD-10-CM | POA: Diagnosis not present

## 2016-06-12 DIAGNOSIS — I2510 Atherosclerotic heart disease of native coronary artery without angina pectoris: Secondary | ICD-10-CM | POA: Diagnosis not present

## 2016-06-12 DIAGNOSIS — I4891 Unspecified atrial fibrillation: Secondary | ICD-10-CM | POA: Diagnosis not present

## 2016-06-12 DIAGNOSIS — Z5181 Encounter for therapeutic drug level monitoring: Secondary | ICD-10-CM | POA: Diagnosis not present

## 2016-06-12 DIAGNOSIS — S82252D Displaced comminuted fracture of shaft of left tibia, subsequent encounter for closed fracture with routine healing: Secondary | ICD-10-CM | POA: Diagnosis not present

## 2016-06-12 DIAGNOSIS — M810 Age-related osteoporosis without current pathological fracture: Secondary | ICD-10-CM | POA: Diagnosis not present

## 2016-06-12 DIAGNOSIS — M1990 Unspecified osteoarthritis, unspecified site: Secondary | ICD-10-CM | POA: Diagnosis not present

## 2016-06-12 DIAGNOSIS — K219 Gastro-esophageal reflux disease without esophagitis: Secondary | ICD-10-CM | POA: Diagnosis not present

## 2016-06-14 DIAGNOSIS — I2510 Atherosclerotic heart disease of native coronary artery without angina pectoris: Secondary | ICD-10-CM | POA: Diagnosis not present

## 2016-06-14 DIAGNOSIS — I2782 Chronic pulmonary embolism: Secondary | ICD-10-CM | POA: Diagnosis not present

## 2016-06-14 DIAGNOSIS — M1990 Unspecified osteoarthritis, unspecified site: Secondary | ICD-10-CM | POA: Diagnosis not present

## 2016-06-14 DIAGNOSIS — I4891 Unspecified atrial fibrillation: Secondary | ICD-10-CM | POA: Diagnosis not present

## 2016-06-14 DIAGNOSIS — M810 Age-related osteoporosis without current pathological fracture: Secondary | ICD-10-CM | POA: Diagnosis not present

## 2016-06-14 DIAGNOSIS — S82252D Displaced comminuted fracture of shaft of left tibia, subsequent encounter for closed fracture with routine healing: Secondary | ICD-10-CM | POA: Diagnosis not present

## 2016-06-16 DIAGNOSIS — Z0000 Encounter for general adult medical examination without abnormal findings: Secondary | ICD-10-CM | POA: Diagnosis not present

## 2016-06-16 DIAGNOSIS — Z1389 Encounter for screening for other disorder: Secondary | ICD-10-CM | POA: Diagnosis not present

## 2016-06-16 DIAGNOSIS — Z1211 Encounter for screening for malignant neoplasm of colon: Secondary | ICD-10-CM | POA: Diagnosis not present

## 2016-06-16 DIAGNOSIS — Z299 Encounter for prophylactic measures, unspecified: Secondary | ICD-10-CM | POA: Diagnosis not present

## 2016-06-16 DIAGNOSIS — E7800 Pure hypercholesterolemia, unspecified: Secondary | ICD-10-CM | POA: Diagnosis not present

## 2016-06-16 DIAGNOSIS — Z7189 Other specified counseling: Secondary | ICD-10-CM | POA: Diagnosis not present

## 2016-06-17 DIAGNOSIS — S82252D Displaced comminuted fracture of shaft of left tibia, subsequent encounter for closed fracture with routine healing: Secondary | ICD-10-CM | POA: Diagnosis not present

## 2016-06-17 DIAGNOSIS — M810 Age-related osteoporosis without current pathological fracture: Secondary | ICD-10-CM | POA: Diagnosis not present

## 2016-06-17 DIAGNOSIS — I2510 Atherosclerotic heart disease of native coronary artery without angina pectoris: Secondary | ICD-10-CM | POA: Diagnosis not present

## 2016-06-17 DIAGNOSIS — I4891 Unspecified atrial fibrillation: Secondary | ICD-10-CM | POA: Diagnosis not present

## 2016-06-17 DIAGNOSIS — I2782 Chronic pulmonary embolism: Secondary | ICD-10-CM | POA: Diagnosis not present

## 2016-06-17 DIAGNOSIS — M1990 Unspecified osteoarthritis, unspecified site: Secondary | ICD-10-CM | POA: Diagnosis not present

## 2016-06-18 DIAGNOSIS — E7800 Pure hypercholesterolemia, unspecified: Secondary | ICD-10-CM | POA: Diagnosis not present

## 2016-06-18 DIAGNOSIS — R5383 Other fatigue: Secondary | ICD-10-CM | POA: Diagnosis not present

## 2016-06-18 DIAGNOSIS — Z79899 Other long term (current) drug therapy: Secondary | ICD-10-CM | POA: Diagnosis not present

## 2016-06-22 DIAGNOSIS — I4891 Unspecified atrial fibrillation: Secondary | ICD-10-CM | POA: Diagnosis not present

## 2016-06-22 DIAGNOSIS — M810 Age-related osteoporosis without current pathological fracture: Secondary | ICD-10-CM | POA: Diagnosis not present

## 2016-06-22 DIAGNOSIS — S82252D Displaced comminuted fracture of shaft of left tibia, subsequent encounter for closed fracture with routine healing: Secondary | ICD-10-CM | POA: Diagnosis not present

## 2016-06-22 DIAGNOSIS — I2510 Atherosclerotic heart disease of native coronary artery without angina pectoris: Secondary | ICD-10-CM | POA: Diagnosis not present

## 2016-06-22 DIAGNOSIS — I2782 Chronic pulmonary embolism: Secondary | ICD-10-CM | POA: Diagnosis not present

## 2016-06-22 DIAGNOSIS — M1990 Unspecified osteoarthritis, unspecified site: Secondary | ICD-10-CM | POA: Diagnosis not present

## 2016-06-24 DIAGNOSIS — S82252D Displaced comminuted fracture of shaft of left tibia, subsequent encounter for closed fracture with routine healing: Secondary | ICD-10-CM | POA: Diagnosis not present

## 2016-06-24 DIAGNOSIS — M1990 Unspecified osteoarthritis, unspecified site: Secondary | ICD-10-CM | POA: Diagnosis not present

## 2016-06-24 DIAGNOSIS — M810 Age-related osteoporosis without current pathological fracture: Secondary | ICD-10-CM | POA: Diagnosis not present

## 2016-06-24 DIAGNOSIS — I2510 Atherosclerotic heart disease of native coronary artery without angina pectoris: Secondary | ICD-10-CM | POA: Diagnosis not present

## 2016-06-24 DIAGNOSIS — I4891 Unspecified atrial fibrillation: Secondary | ICD-10-CM | POA: Diagnosis not present

## 2016-06-24 DIAGNOSIS — I2782 Chronic pulmonary embolism: Secondary | ICD-10-CM | POA: Diagnosis not present

## 2016-06-25 DIAGNOSIS — B356 Tinea cruris: Secondary | ICD-10-CM | POA: Diagnosis not present

## 2016-06-29 ENCOUNTER — Encounter (HOSPITAL_COMMUNITY): Payer: Self-pay | Admitting: Psychiatry

## 2016-06-29 ENCOUNTER — Ambulatory Visit (INDEPENDENT_AMBULATORY_CARE_PROVIDER_SITE_OTHER): Payer: Medicare Other | Admitting: Psychiatry

## 2016-06-29 VITALS — BP 150/96 | HR 93 | Ht 65.0 in | Wt 210.2 lb

## 2016-06-29 DIAGNOSIS — F331 Major depressive disorder, recurrent, moderate: Secondary | ICD-10-CM

## 2016-06-29 MED ORDER — DULOXETINE HCL 60 MG PO CPEP: 60.0000 mg | ORAL_CAPSULE | Freq: Two times a day (BID) | ORAL | 3 refills | Status: DC

## 2016-06-29 MED ORDER — HYDROXYZINE HCL 50 MG PO TABS: 50.0000 mg | ORAL_TABLET | Freq: Every day | ORAL | 2 refills | Status: DC

## 2016-06-29 MED ORDER — DIAZEPAM 2 MG PO TABS
2.0000 mg | ORAL_TABLET | Freq: Two times a day (BID) | ORAL | 3 refills | Status: DC | PRN
Start: 2016-06-29 — End: 2016-09-21

## 2016-06-29 MED ORDER — ARIPIPRAZOLE 10 MG PO TABS: 10.0000 mg | ORAL_TABLET | Freq: Every day | ORAL | 3 refills | Status: DC
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
## 2016-06-30 DIAGNOSIS — I2782 Chronic pulmonary embolism: Secondary | ICD-10-CM | POA: Diagnosis not present

## 2016-06-30 DIAGNOSIS — I2510 Atherosclerotic heart disease of native coronary artery without angina pectoris: Secondary | ICD-10-CM | POA: Diagnosis not present

## 2016-06-30 DIAGNOSIS — M810 Age-related osteoporosis without current pathological fracture: Secondary | ICD-10-CM | POA: Diagnosis not present

## 2016-06-30 DIAGNOSIS — S82252D Displaced comminuted fracture of shaft of left tibia, subsequent encounter for closed fracture with routine healing: Secondary | ICD-10-CM | POA: Diagnosis not present

## 2016-06-30 DIAGNOSIS — I4891 Unspecified atrial fibrillation: Secondary | ICD-10-CM | POA: Diagnosis not present

## 2016-06-30 DIAGNOSIS — M1990 Unspecified osteoarthritis, unspecified site: Secondary | ICD-10-CM | POA: Diagnosis not present

## 2016-07-01 ENCOUNTER — Telehealth (HOSPITAL_COMMUNITY): Payer: Self-pay | Admitting: *Deleted

## 2016-07-01 DIAGNOSIS — I2510 Atherosclerotic heart disease of native coronary artery without angina pectoris: Secondary | ICD-10-CM | POA: Diagnosis not present

## 2016-07-01 DIAGNOSIS — M810 Age-related osteoporosis without current pathological fracture: Secondary | ICD-10-CM | POA: Diagnosis not present

## 2016-07-01 DIAGNOSIS — I4891 Unspecified atrial fibrillation: Secondary | ICD-10-CM | POA: Diagnosis not present

## 2016-07-01 DIAGNOSIS — M1990 Unspecified osteoarthritis, unspecified site: Secondary | ICD-10-CM | POA: Diagnosis not present

## 2016-07-01 DIAGNOSIS — I2782 Chronic pulmonary embolism: Secondary | ICD-10-CM | POA: Diagnosis not present

## 2016-07-01 DIAGNOSIS — S82252D Displaced comminuted fracture of shaft of left tibia, subsequent encounter for closed fracture with routine healing: Secondary | ICD-10-CM | POA: Diagnosis not present

## 2016-07-01 NOTE — Telephone Encounter (Signed)
voice message from patient.   Please call her regarding the mistake with the pharmacy on her paperwork.

## 2016-07-02 NOTE — Telephone Encounter (Signed)
Spoke with pt and she stated that there was a mix up with her pharmacy. Per pt all her medications are suppose to go to [facility] including the Abilify except one drug which is suppose to go to [place]. Informed pt when she is in office and office is verifying pharmacy, only 1 pharmacy can be set as the default. But office can put in as many pharmacy as she wants in her chart. Per pt then stated oh okay. Can office make her pharmacy [facility] first and then [facility]. Staff agreed. Staff then stated to call [facility] and have them request a transfer from [facility] for her medications as long as it is not a controlled substance. Pt verbalized understanding.

## 2016-07-05 DIAGNOSIS — M1990 Unspecified osteoarthritis, unspecified site: Secondary | ICD-10-CM | POA: Diagnosis not present

## 2016-07-05 DIAGNOSIS — I2782 Chronic pulmonary embolism: Secondary | ICD-10-CM | POA: Diagnosis not present

## 2016-07-05 DIAGNOSIS — S82252D Displaced comminuted fracture of shaft of left tibia, subsequent encounter for closed fracture with routine healing: Secondary | ICD-10-CM | POA: Diagnosis not present

## 2016-07-05 DIAGNOSIS — M810 Age-related osteoporosis without current pathological fracture: Secondary | ICD-10-CM | POA: Diagnosis not present

## 2016-07-05 DIAGNOSIS — I2510 Atherosclerotic heart disease of native coronary artery without angina pectoris: Secondary | ICD-10-CM | POA: Diagnosis not present

## 2016-07-05 DIAGNOSIS — I4891 Unspecified atrial fibrillation: Secondary | ICD-10-CM | POA: Diagnosis not present

## 2016-07-06 DIAGNOSIS — Z23 Encounter for immunization: Secondary | ICD-10-CM | POA: Diagnosis not present

## 2016-07-07 ENCOUNTER — Ambulatory Visit (INDEPENDENT_AMBULATORY_CARE_PROVIDER_SITE_OTHER): Payer: Medicare Other | Admitting: Orthopedic Surgery

## 2016-07-07 ENCOUNTER — Encounter (INDEPENDENT_AMBULATORY_CARE_PROVIDER_SITE_OTHER): Payer: Self-pay | Admitting: Orthopedic Surgery

## 2016-07-07 ENCOUNTER — Ambulatory Visit (INDEPENDENT_AMBULATORY_CARE_PROVIDER_SITE_OTHER): Payer: Medicare Other

## 2016-07-07 ENCOUNTER — Telehealth (INDEPENDENT_AMBULATORY_CARE_PROVIDER_SITE_OTHER): Payer: Self-pay | Admitting: *Deleted

## 2016-07-07 VITALS — BP 140/78 | HR 79 | Ht 65.0 in | Wt 202.0 lb

## 2016-07-07 DIAGNOSIS — S82102D Unspecified fracture of upper end of left tibia, subsequent encounter for closed fracture with routine healing: Secondary | ICD-10-CM

## 2016-07-07 DIAGNOSIS — M810 Age-related osteoporosis without current pathological fracture: Secondary | ICD-10-CM | POA: Diagnosis not present

## 2016-07-07 DIAGNOSIS — I2782 Chronic pulmonary embolism: Secondary | ICD-10-CM | POA: Diagnosis not present

## 2016-07-07 DIAGNOSIS — M1990 Unspecified osteoarthritis, unspecified site: Secondary | ICD-10-CM | POA: Diagnosis not present

## 2016-07-07 DIAGNOSIS — I2510 Atherosclerotic heart disease of native coronary artery without angina pectoris: Secondary | ICD-10-CM | POA: Diagnosis not present

## 2016-07-07 DIAGNOSIS — I4891 Unspecified atrial fibrillation: Secondary | ICD-10-CM | POA: Diagnosis not present

## 2016-07-07 DIAGNOSIS — S82252D Displaced comminuted fracture of shaft of left tibia, subsequent encounter for closed fracture with routine healing: Secondary | ICD-10-CM | POA: Diagnosis not present

## 2016-07-07 NOTE — Telephone Encounter (Signed)
Debbie from [facility] called and states she has been working with the patient for a while and wants to extend her Physical therapy to 1 time a week for 4 more weeks . She states you can call her with that verbal order. Her number is  [phone]. Please advise provider. Thank you .

## 2016-07-07 NOTE — Progress Notes (Signed)
Office Visit Note   Patient: Grace Howard           Date of Birth: 03/25/1936           MRN: [id number] Visit Date: 07/07/2016              Requested by: Ignatius Specking, MD [address] [place], [place] [address] PCP: Ignatius Specking, MD   Assessment & Plan: Visit Diagnoses:  1. Closed fracture of proximal end of left tibia with routine healing, unspecified fracture morphology, subsequent encounter     Plan: At this time our plan is to continue with the walker with this weightbearing as she has been doing previously. She does have a problem with balance also, therefore walker will be of benefit for her  Follow-Up Instructions: Return in about 3 weeks (around 07/28/2016).   Orders:  Orders Placed This Encounter  Procedures  . XR Knee 1-2 Views Left   No orders of the defined types were placed in this encounter.     Procedures: No procedures performed   Clinical Data: No additional findings.   Subjective: Chief Complaint  Patient presents with  . Left Lower Leg - Fracture, Routine Post Op    Follow up for Left proximal Tibia FX on 02/05/16. Left knee injected 06/09/16 and it helped Ambulates with walker trying to progress to a cane PT is working on strengthening and balance    Left knee injected 06/09/16 and it helped Ambulates with a walker trying to progress to a cane Pt working on strengthening and balance Grace Howard is a pleasant 80 year old female who is seen today for evaluation of her proximal medial tibial plateau fracture. This occurred on 02/05/2016. She has been ambulating with her walker and try to progress to a cane but with her balance issues she does have a problem. She continues to have physical therapy at home working on her balance and strengthening. Still having pain over the proximal tibial area. Seen today for reevaluation recheck x-ray.    Review of Systems  HENT: Negative.   Eyes: Negative.   Respiratory: Negative.   Cardiovascular: Negative.     Gastrointestinal: Negative.   Endocrine: Negative.   Genitourinary: Negative.   Musculoskeletal: Negative.   Skin: Negative.   Allergic/Immunologic: Negative.   Neurological: Negative.   Hematological: Negative.   Psychiatric/Behavioral: Negative.      Objective: Vital Signs: BP 140/78   Pulse 79   Ht 5\' 5"  (1.651 m)   Wt 202 lb (91.6 kg)   BMI 33.61 kg/m   Physical Exam  Constitutional: She is oriented to person, place, and time. She appears well-developed and well-nourished.  Eyes: Pupils are equal, round, and reactive to light.  Pulmonary/Chest: Effort normal.  Neurological: She is alert and oriented to person, place, and time.  Skin: Skin is warm and dry.  Psychiatric: She has a normal mood and affect. Her behavior is normal. Judgment and thought content normal.    Left Knee Exam   Tenderness  The patient is experiencing tenderness in the medial joint line.  Range of Motion  Extension: 0  Flexion: 100   Other  Sensation: normal Swelling: mild  Comments:  She is tender over the proximal medial tibial plateau. Some pain at the joint line. Unable to determine fusion because size for her knee.      Specialty Comments:  No specialty comments available.  Imaging: Xr Knee 1-2 Views Left  Result Date: 07/07/2016 Two-view x-ray left knee revealed  callous formation about the fracture site. Appears though that the medial fragment has some depression causing some varus positioning of the knee.    PMFS History: Patient Active Problem List   Diagnosis Date Noted  . Insomnia secondary to depression with anxiety 12/06/2012  . Chronic pain syndrome 10/16/2012  . Unspecified vitamin D deficiency 10/16/2012  . MDD (major depressive disorder) 02/15/2012   Past Medical History:  Diagnosis Date  . Anxiety   . Depression   . GERD (gastroesophageal reflux disease)   . Headache(784.0)   . HTN (hypertension)   . Hyperlipidemia   . Insomnia   . Lupus   . Macular  degeneration June 2013  . Osteoarthritis June 2013  . Osteoporosis   . PE (pulmonary embolism) 2008   not sure why  . Seizures (HCC)   . Wears glasses     Family History  Problem Relation Age of Onset  . Depression Sister   . Anxiety disorder Sister   . Sexual abuse Sister   . Dementia Father   . Alcohol abuse Paternal Aunt   . Drug abuse Paternal Uncle   . Bipolar disorder Neg Hx   . OCD Neg Hx   . Paranoid behavior Neg Hx   . Schizophrenia Neg Hx   . Seizures Neg Hx   . Physical abuse Neg Hx   . ADD / ADHD Other   . ADD / ADHD Other   . Healthy Son   . Healthy Son     Past Surgical History:  Procedure Laterality Date  . abdomnial surgery    . ANKLE FRACTURE SURGERY  2000   right  . APPENDECTOMY    . CARDIAC CATHETERIZATION  2008   PE-filter in  . COLONOSCOPY    . DILITATION & CURRETTAGE/HYSTROSCOPY WITH ESSURE    . EYE SURGERY     both cataracts  . HARDWARE REMOVAL Left 02/26/2014   Procedure: REMOVAL HARDWARE ;  Surgeon: Tami Ribas, MD;  Location: [facility];  Service: Orthopedics;  Laterality: Left;  . TONSILLECTOMY    . WRIST SURGERY  2011   may-lt   Social History   Occupational History  . Not on file.   Social History Main Topics  . Smoking status: Former Smoker    Quit date: 02/19/1978  . Smokeless tobacco: Not on file  . Alcohol use No  . Drug use: No  . Sexual activity: No

## 2016-07-08 DIAGNOSIS — I2699 Other pulmonary embolism without acute cor pulmonale: Secondary | ICD-10-CM | POA: Diagnosis not present

## 2016-07-08 DIAGNOSIS — M545 Low back pain: Secondary | ICD-10-CM | POA: Diagnosis not present

## 2016-07-08 NOTE — Telephone Encounter (Signed)
Please advise 

## 2016-07-09 NOTE — Telephone Encounter (Signed)
Please check.

## 2016-07-12 NOTE — Telephone Encounter (Signed)
Called Debbie with [facility] to continue with the verbal orders per last visit.

## 2016-07-13 DIAGNOSIS — B372 Candidiasis of skin and nail: Secondary | ICD-10-CM | POA: Diagnosis not present

## 2016-07-13 DIAGNOSIS — Z299 Encounter for prophylactic measures, unspecified: Secondary | ICD-10-CM | POA: Diagnosis not present

## 2016-07-13 DIAGNOSIS — Z713 Dietary counseling and surveillance: Secondary | ICD-10-CM | POA: Diagnosis not present

## 2016-07-13 DIAGNOSIS — Z6830 Body mass index (BMI) 30.0-30.9, adult: Secondary | ICD-10-CM | POA: Diagnosis not present

## 2016-07-13 DIAGNOSIS — I10 Essential (primary) hypertension: Secondary | ICD-10-CM | POA: Diagnosis not present

## 2016-07-14 ENCOUNTER — Telehealth (HOSPITAL_COMMUNITY): Payer: Self-pay | Admitting: *Deleted

## 2016-07-14 DIAGNOSIS — I4891 Unspecified atrial fibrillation: Secondary | ICD-10-CM | POA: Diagnosis not present

## 2016-07-14 DIAGNOSIS — I10 Essential (primary) hypertension: Secondary | ICD-10-CM | POA: Diagnosis not present

## 2016-07-14 DIAGNOSIS — I2510 Atherosclerotic heart disease of native coronary artery without angina pectoris: Secondary | ICD-10-CM | POA: Diagnosis not present

## 2016-07-14 DIAGNOSIS — M159 Polyosteoarthritis, unspecified: Secondary | ICD-10-CM | POA: Diagnosis not present

## 2016-07-14 NOTE — Telephone Encounter (Signed)
I worked numerous places there so I don't know. This problem is THEIR fault, they need to update my address in their system. I have been here for 3 years

## 2016-07-14 NOTE — Telephone Encounter (Signed)
Called [employer] back, spoke with Marylene Land who was able to help with letter received stating Vistaril would not be on formulary starting Jan 2018. Was told that it is currently approved until December 2017 and nothing needs to be completed at this time.

## 2016-07-14 NOTE — Telephone Encounter (Signed)
Attempted to call [employer] [phone] for authorization of Vistaril but was told MD address on file with NPI number is incorrect. They will not continue authorization until NPI matches [place] address. They have a [place] address.

## 2016-07-15 NOTE — Telephone Encounter (Signed)
noted 

## 2016-07-20 ENCOUNTER — Ambulatory Visit (HOSPITAL_COMMUNITY): Payer: Self-pay | Admitting: Psychiatry

## 2016-07-21 DIAGNOSIS — S82252D Displaced comminuted fracture of shaft of left tibia, subsequent encounter for closed fracture with routine healing: Secondary | ICD-10-CM | POA: Diagnosis not present

## 2016-07-21 DIAGNOSIS — M810 Age-related osteoporosis without current pathological fracture: Secondary | ICD-10-CM | POA: Diagnosis not present

## 2016-07-21 DIAGNOSIS — I2510 Atherosclerotic heart disease of native coronary artery without angina pectoris: Secondary | ICD-10-CM | POA: Diagnosis not present

## 2016-07-21 DIAGNOSIS — I2782 Chronic pulmonary embolism: Secondary | ICD-10-CM | POA: Diagnosis not present

## 2016-07-21 DIAGNOSIS — I4891 Unspecified atrial fibrillation: Secondary | ICD-10-CM | POA: Diagnosis not present

## 2016-07-21 DIAGNOSIS — M1990 Unspecified osteoarthritis, unspecified site: Secondary | ICD-10-CM | POA: Diagnosis not present

## 2016-07-23 DIAGNOSIS — I2699 Other pulmonary embolism without acute cor pulmonale: Secondary | ICD-10-CM | POA: Diagnosis not present

## 2016-07-23 DIAGNOSIS — I4891 Unspecified atrial fibrillation: Secondary | ICD-10-CM | POA: Diagnosis not present

## 2016-07-23 DIAGNOSIS — B372 Candidiasis of skin and nail: Secondary | ICD-10-CM | POA: Diagnosis not present

## 2016-07-23 DIAGNOSIS — M329 Systemic lupus erythematosus, unspecified: Secondary | ICD-10-CM | POA: Diagnosis not present

## 2016-07-25 DIAGNOSIS — I2782 Chronic pulmonary embolism: Secondary | ICD-10-CM | POA: Diagnosis not present

## 2016-07-25 DIAGNOSIS — S82252D Displaced comminuted fracture of shaft of left tibia, subsequent encounter for closed fracture with routine healing: Secondary | ICD-10-CM | POA: Diagnosis not present

## 2016-07-25 DIAGNOSIS — I2510 Atherosclerotic heart disease of native coronary artery without angina pectoris: Secondary | ICD-10-CM | POA: Diagnosis not present

## 2016-07-25 DIAGNOSIS — I4891 Unspecified atrial fibrillation: Secondary | ICD-10-CM | POA: Diagnosis not present

## 2016-07-25 DIAGNOSIS — M1990 Unspecified osteoarthritis, unspecified site: Secondary | ICD-10-CM | POA: Diagnosis not present

## 2016-07-25 DIAGNOSIS — M810 Age-related osteoporosis without current pathological fracture: Secondary | ICD-10-CM | POA: Diagnosis not present

## 2016-07-28 ENCOUNTER — Ambulatory Visit (INDEPENDENT_AMBULATORY_CARE_PROVIDER_SITE_OTHER): Payer: Medicare Other | Admitting: Orthopaedic Surgery

## 2016-07-28 DIAGNOSIS — M25552 Pain in left hip: Secondary | ICD-10-CM | POA: Diagnosis not present

## 2016-07-28 MED ORDER — LIDOCAINE HCL 1 % IJ SOLN
5.0000 mL | INTRAMUSCULAR | Status: AC | PRN
  Administered 2016-07-28: 5 mL

## 2016-07-28 MED ORDER — METHYLPREDNISOLONE ACETATE 40 MG/ML IJ SUSP
80.0000 mg | INTRAMUSCULAR | Status: AC | PRN
  Administered 2016-07-28: 80 mg

## 2016-07-28 NOTE — Progress Notes (Signed)
Office Visit Note   Patient: Grace Howard           Date of Birth: 1935/10/05           MRN: [id number] Visit Date: 07/28/2016              Requested by: Ignatius Specking, MD [address] [place], [place] [address] PCP: Ignatius Specking, MD   Assessment & Plan: Visit Diagnoses:  1. Pain in left hip     Plan: I will plan to see Grace Howard as needed. She continues to use a walker because of "my balance". She's had an issue with her back and does see Dr. Weston Brass. About a year ago we injected the lateral aspect of her left hip and it made a difference. More recently she's been followed for the left proximal tibia fracture. There seems to be routine healing without displacement. Presently she is not complaining of any pain about her left knee. I think the fracture is healed she does have some arthritis of the left knee but the biggest issue was with her balance and chronic back and left hip pain that responded to cortisone in the past on its worth injecting her left greater trochanter today and monitor her response. I do not think the fracture is presently a problem and has healed. We'll plan to see her back as needed.  Follow-Up Instructions: Return if symptoms worsen or fail to improve.   Orders:  No orders of the defined types were placed in this encounter.  No orders of the defined types were placed in this encounter.     Procedures: Large Joint Inj Date/Time: 07/28/2016 10:45 AM Performed by: Valeria Batman Authorized by: Valeria Batman   Consent Given by:  Patient Timeout: prior to procedure the correct patient, procedure, and site was verified   Indications:  Pain Location:  Hip Site:  L greater trochanter Prep: patient was prepped and draped in usual sterile fashion   Needle Size:  22 G Needle Length:  3.5 inches Approach:  Lateral Ultrasound Guidance: No   Fluoroscopic Guidance: No   Arthrogram: No   Medications:  5 mL lidocaine 1 %; 80 mg methylPREDNISolone acetate 40  MG/ML Aspiration Attempted: No   Patient tolerance:  Patient tolerated the procedure well with no immediate complications     Clinical Data: No additional findings.   Subjective: No chief complaint on file.   HPI  Review of Systems   Objective: Vital Signs: There were no vitals taken for this visit.  Physical Exam  Ortho Exam left lower extremity exam is negative for straight leg raise she does have local tenderness over the greater trochanter of the left hip without loss of motion. Did not have any swelling or ecchymosis or discomfort about the left knee either long medial lateral joint the patella, the patellofemoral joint or the proximal tibia medially where she had the fracture. Neurologically intact distally. No swelling. No specialty comments available.  Imaging: No results found.   PMFS History: Patient Active Problem List   Diagnosis Date Noted  . Insomnia secondary to depression with anxiety 12/06/2012  . Chronic pain syndrome 10/16/2012  . Unspecified vitamin D deficiency 10/16/2012  . MDD (major depressive disorder) 02/15/2012   Past Medical History:  Diagnosis Date  . Anxiety   . Depression   . GERD (gastroesophageal reflux disease)   . Headache(784.0)   . HTN (hypertension)   . Hyperlipidemia   . Insomnia   .  Lupus   . Macular degeneration June 2013  . Osteoarthritis June 2013  . Osteoporosis   . PE (pulmonary embolism) 2008   not sure why  . Seizures (HCC)   . Wears glasses     Family History  Problem Relation Age of Onset  . Depression Sister   . Anxiety disorder Sister   . Sexual abuse Sister   . Dementia Father   . Alcohol abuse Paternal Aunt   . Drug abuse Paternal Uncle   . Bipolar disorder Neg Hx   . OCD Neg Hx   . Paranoid behavior Neg Hx   . Schizophrenia Neg Hx   . Seizures Neg Hx   . Physical abuse Neg Hx   . ADD / ADHD Other   . ADD / ADHD Other   . Healthy Son   . Healthy Son     Past Surgical History:  Procedure  Laterality Date  . abdomnial surgery    . ANKLE FRACTURE SURGERY  2000   right  . APPENDECTOMY    . CARDIAC CATHETERIZATION  2008   PE-filter in  . COLONOSCOPY    . DILITATION & CURRETTAGE/HYSTROSCOPY WITH ESSURE    . EYE SURGERY     both cataracts  . HARDWARE REMOVAL Left 02/26/2014   Procedure: REMOVAL HARDWARE ;  Surgeon: Tami Ribas, MD;  Location: [facility];  Service: Orthopedics;  Laterality: Left;  . TONSILLECTOMY    . WRIST SURGERY  2011   may-lt   Social History   Occupational History  . Not on file.   Social History Main Topics  . Smoking status: Former Smoker    Quit date: 02/19/1978  . Smokeless tobacco: Not on file  . Alcohol use No  . Drug use: No  . Sexual activity: No

## 2016-08-03 DIAGNOSIS — Z1231 Encounter for screening mammogram for malignant neoplasm of breast: Secondary | ICD-10-CM | POA: Diagnosis not present

## 2016-08-04 DIAGNOSIS — M1990 Unspecified osteoarthritis, unspecified site: Secondary | ICD-10-CM | POA: Diagnosis not present

## 2016-08-04 DIAGNOSIS — S82252D Displaced comminuted fracture of shaft of left tibia, subsequent encounter for closed fracture with routine healing: Secondary | ICD-10-CM | POA: Diagnosis not present

## 2016-08-04 DIAGNOSIS — I4891 Unspecified atrial fibrillation: Secondary | ICD-10-CM | POA: Diagnosis not present

## 2016-08-04 DIAGNOSIS — M810 Age-related osteoporosis without current pathological fracture: Secondary | ICD-10-CM | POA: Diagnosis not present

## 2016-08-04 DIAGNOSIS — I2782 Chronic pulmonary embolism: Secondary | ICD-10-CM | POA: Diagnosis not present

## 2016-08-04 DIAGNOSIS — I2510 Atherosclerotic heart disease of native coronary artery without angina pectoris: Secondary | ICD-10-CM | POA: Diagnosis not present

## 2016-08-06 DIAGNOSIS — Z299 Encounter for prophylactic measures, unspecified: Secondary | ICD-10-CM | POA: Diagnosis not present

## 2016-08-06 DIAGNOSIS — Z713 Dietary counseling and surveillance: Secondary | ICD-10-CM | POA: Diagnosis not present

## 2016-08-06 DIAGNOSIS — Z6832 Body mass index (BMI) 32.0-32.9, adult: Secondary | ICD-10-CM | POA: Diagnosis not present

## 2016-08-06 DIAGNOSIS — I2699 Other pulmonary embolism without acute cor pulmonale: Secondary | ICD-10-CM | POA: Diagnosis not present

## 2016-08-06 DIAGNOSIS — M545 Low back pain: Secondary | ICD-10-CM | POA: Diagnosis not present

## 2016-08-12 DIAGNOSIS — E2839 Other primary ovarian failure: Secondary | ICD-10-CM | POA: Diagnosis not present

## 2016-08-16 ENCOUNTER — Telehealth (INDEPENDENT_AMBULATORY_CARE_PROVIDER_SITE_OTHER): Payer: Self-pay | Admitting: Specialist

## 2016-08-16 ENCOUNTER — Other Ambulatory Visit (INDEPENDENT_AMBULATORY_CARE_PROVIDER_SITE_OTHER): Payer: Self-pay | Admitting: Specialist

## 2016-08-16 MED ORDER — CELECOXIB 100 MG PO CAPS: 100.0000 mg | ORAL_CAPSULE | Freq: Two times a day (BID) | ORAL | 6 refills | Status: DC

## 2016-08-16 NOTE — Telephone Encounter (Signed)
Please advise 

## 2016-08-16 NOTE — Telephone Encounter (Signed)
Pt requesting if we can prescribe her an rx that states she is to take Celebrex 1x daily instead of 2. She states she doesn't need 2 daily but the pharmacy won't just do 1 unless its written that way. Pt number is [phone]

## 2016-08-19 DIAGNOSIS — I4891 Unspecified atrial fibrillation: Secondary | ICD-10-CM | POA: Diagnosis not present

## 2016-08-19 DIAGNOSIS — M159 Polyosteoarthritis, unspecified: Secondary | ICD-10-CM | POA: Diagnosis not present

## 2016-08-19 DIAGNOSIS — I2510 Atherosclerotic heart disease of native coronary artery without angina pectoris: Secondary | ICD-10-CM | POA: Diagnosis not present

## 2016-08-19 DIAGNOSIS — I10 Essential (primary) hypertension: Secondary | ICD-10-CM | POA: Diagnosis not present

## 2016-08-20 ENCOUNTER — Other Ambulatory Visit (HOSPITAL_COMMUNITY): Payer: Self-pay | Admitting: Psychiatry

## 2016-08-27 ENCOUNTER — Telehealth (INDEPENDENT_AMBULATORY_CARE_PROVIDER_SITE_OTHER): Payer: Self-pay | Admitting: Specialist

## 2016-08-27 NOTE — Telephone Encounter (Signed)
Cathy with [facility] called advised she rec'd Rx for Celebrex stating patient is to take twice a day.  The patient said she only take it once a day. Cathy asked if the Rx can be faxed back over stating patient is to take 1 cap. Once a day. The number to contact Lynden Ang is [phone]   The fax# is [phone]

## 2016-08-27 NOTE — Telephone Encounter (Signed)
Will you please print new rx with sig stating only take 1 tablet daily. I tried to call but they need new rx.  Thanks.

## 2016-09-02 DIAGNOSIS — I4891 Unspecified atrial fibrillation: Secondary | ICD-10-CM | POA: Diagnosis not present

## 2016-09-02 DIAGNOSIS — Z6832 Body mass index (BMI) 32.0-32.9, adult: Secondary | ICD-10-CM | POA: Diagnosis not present

## 2016-09-02 DIAGNOSIS — M545 Low back pain: Secondary | ICD-10-CM | POA: Diagnosis not present

## 2016-09-02 DIAGNOSIS — R739 Hyperglycemia, unspecified: Secondary | ICD-10-CM | POA: Diagnosis not present

## 2016-09-02 DIAGNOSIS — Z299 Encounter for prophylactic measures, unspecified: Secondary | ICD-10-CM | POA: Diagnosis not present

## 2016-09-02 NOTE — Telephone Encounter (Signed)
I wrote the prescription stating the maximum amount that she can take per day, another prescription is not necessary. jen

## 2016-09-10 DIAGNOSIS — I2510 Atherosclerotic heart disease of native coronary artery without angina pectoris: Secondary | ICD-10-CM | POA: Diagnosis not present

## 2016-09-10 DIAGNOSIS — M159 Polyosteoarthritis, unspecified: Secondary | ICD-10-CM | POA: Diagnosis not present

## 2016-09-10 DIAGNOSIS — I10 Essential (primary) hypertension: Secondary | ICD-10-CM | POA: Diagnosis not present

## 2016-09-10 DIAGNOSIS — I4891 Unspecified atrial fibrillation: Secondary | ICD-10-CM | POA: Diagnosis not present

## 2016-09-20 ENCOUNTER — Other Ambulatory Visit (INDEPENDENT_AMBULATORY_CARE_PROVIDER_SITE_OTHER): Payer: Self-pay | Admitting: Specialist

## 2016-09-20 ENCOUNTER — Other Ambulatory Visit (HOSPITAL_COMMUNITY): Payer: Self-pay | Admitting: Psychiatry

## 2016-09-21 ENCOUNTER — Encounter (HOSPITAL_COMMUNITY): Payer: Self-pay | Admitting: Psychiatry

## 2016-09-21 ENCOUNTER — Ambulatory Visit (INDEPENDENT_AMBULATORY_CARE_PROVIDER_SITE_OTHER): Payer: Medicare Other | Admitting: Psychiatry

## 2016-09-21 VITALS — BP 136/83 | HR 90 | Ht 65.0 in | Wt 213.2 lb

## 2016-09-21 DIAGNOSIS — Z79899 Other long term (current) drug therapy: Secondary | ICD-10-CM | POA: Diagnosis not present

## 2016-09-21 DIAGNOSIS — F331 Major depressive disorder, recurrent, moderate: Secondary | ICD-10-CM | POA: Diagnosis not present

## 2016-09-21 MED ORDER — DULOXETINE HCL 60 MG PO CPEP: 60.0000 mg | ORAL_CAPSULE | Freq: Two times a day (BID) | ORAL | 3 refills | Status: DC

## 2016-09-21 MED ORDER — ARIPIPRAZOLE 10 MG PO TABS: 10.0000 mg | ORAL_TABLET | Freq: Every day | ORAL | 3 refills | Status: DC

## 2016-09-21 MED ORDER — DIAZEPAM 2 MG PO TABS: 2.0000 mg | ORAL_TABLET | Freq: Three times a day (TID) | ORAL | 3 refills | Status: DC | PRN
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
## 2016-10-05 DIAGNOSIS — R569 Unspecified convulsions: Secondary | ICD-10-CM | POA: Diagnosis not present

## 2016-10-05 DIAGNOSIS — I2699 Other pulmonary embolism without acute cor pulmonale: Secondary | ICD-10-CM | POA: Diagnosis not present

## 2016-10-05 DIAGNOSIS — M545 Low back pain: Secondary | ICD-10-CM | POA: Diagnosis not present

## 2016-10-05 DIAGNOSIS — Z299 Encounter for prophylactic measures, unspecified: Secondary | ICD-10-CM | POA: Diagnosis not present

## 2016-10-05 DIAGNOSIS — M329 Systemic lupus erythematosus, unspecified: Secondary | ICD-10-CM | POA: Diagnosis not present

## 2016-10-18 DIAGNOSIS — M159 Polyosteoarthritis, unspecified: Secondary | ICD-10-CM | POA: Diagnosis not present

## 2016-10-18 DIAGNOSIS — I2510 Atherosclerotic heart disease of native coronary artery without angina pectoris: Secondary | ICD-10-CM | POA: Diagnosis not present

## 2016-10-18 DIAGNOSIS — I10 Essential (primary) hypertension: Secondary | ICD-10-CM | POA: Diagnosis not present

## 2016-10-18 DIAGNOSIS — I4891 Unspecified atrial fibrillation: Secondary | ICD-10-CM | POA: Diagnosis not present

## 2016-10-20 ENCOUNTER — Ambulatory Visit (INDEPENDENT_AMBULATORY_CARE_PROVIDER_SITE_OTHER): Payer: Medicare Other | Admitting: Orthopaedic Surgery

## 2016-10-20 ENCOUNTER — Encounter (INDEPENDENT_AMBULATORY_CARE_PROVIDER_SITE_OTHER): Payer: Self-pay | Admitting: Orthopaedic Surgery

## 2016-10-20 VITALS — BP 123/75 | HR 88 | Ht 66.0 in | Wt 210.0 lb

## 2016-10-20 DIAGNOSIS — G8929 Other chronic pain: Secondary | ICD-10-CM

## 2016-10-20 DIAGNOSIS — M25562 Pain in left knee: Secondary | ICD-10-CM

## 2016-10-20 MED ORDER — BUPIVACAINE HCL 0.5 % IJ SOLN
3.0000 mL | INTRAMUSCULAR | Status: AC | PRN
  Administered 2016-10-20: 3 mL via INTRA_ARTICULAR

## 2016-10-20 MED ORDER — METHYLPREDNISOLONE ACETATE 40 MG/ML IJ SUSP
80.0000 mg | INTRAMUSCULAR | Status: AC | PRN
  Administered 2016-10-20: 80 mg

## 2016-10-20 MED ORDER — LIDOCAINE HCL 1 % IJ SOLN
5.0000 mL | INTRAMUSCULAR | Status: AC | PRN
  Administered 2016-10-20: 5 mL

## 2016-10-20 NOTE — Progress Notes (Signed)
Office Visit Note   Patient: Grace Howard           Date of Birth: 1936-03-27           MRN: [id number] Visit Date: 10/20/2016              Requested by: Ignatius Specking, MD [address] [place], [place] [address] PCP: Ignatius Specking, MD   Assessment & Plan: Visit Diagnoses: Osteoarthritis left knee with general deconditioning and balance issues. I will plan on injecting left knee with cortisone and follow-up with physical therapy for general strengthening exercises when necessary basis.  Follow-Up Instructions: No Follow-up on file.   Orders:  No orders of the defined types were placed in this encounter.  No orders of the defined types were placed in this encounter.     Procedures: Large Joint Inj Date/Time: 10/20/2016 3:45 PM Performed by: Valeria Batman Authorized by: Valeria Batman   Consent Given by:  Patient Timeout: prior to procedure the correct patient, procedure, and site was verified   Indications:  Pain and joint swelling Location:  Knee Site:  L knee Prep: patient was prepped and draped in usual sterile fashion   Needle Size:  25 G Needle Length:  1.5 inches Approach:  Anteromedial Ultrasound Guidance: No   Fluoroscopic Guidance: No   Arthrogram: No   Medications:  5 mL lidocaine 1 %; 80 mg methylPREDNISolone acetate 40 MG/ML; 3 mL bupivacaine 0.5 % Aspiration Attempted: No   Patient tolerance:  Patient tolerated the procedure well with no immediate complications     Clinical Data: No additional findings.   Subjective: Chief Complaint  Patient presents with  . Left Knee - Pain  . Injections    left knee     Patient complains of left knee pain, presents to clinic today requesting injection.   Grace Howard has had prior cortisone injections in her left knee with "good results". She does ambulate with a cane and notes that she has an issue with balance as well as "weakness".  Review of Systems   Objective: Vital Signs: BP 123/75   Pulse 88    Ht 5\' 6"  (1.676 m)   Wt 210 lb (95.3 kg)   BMI 33.89 kg/m   Physical Exam  Ortho Exam F knee was not effused. Full extension and flexion over 100. No instability. No induration or ecchymosis or local tenderness other than the lateral joint line. No swelling distally. Foot was warm with good capillary refill. Denied pain with range of motion of left hip  Specialty Comments:  No specialty comments available.  Imaging: No results found.   PMFS History: Patient Active Problem List   Diagnosis Date Noted  . Insomnia secondary to depression with anxiety 12/06/2012  . Chronic pain syndrome 10/16/2012  . Unspecified vitamin D deficiency 10/16/2012  . MDD (major depressive disorder) 02/15/2012   Past Medical History:  Diagnosis Date  . Anxiety   . Depression   . GERD (gastroesophageal reflux disease)   . Headache(784.0)   . HTN (hypertension)   . Hyperlipidemia   . Insomnia   . Lupus   . Macular degeneration June 2013  . Osteoarthritis June 2013  . Osteoporosis   . PE (pulmonary embolism) 2008   not sure why  . Seizures (HCC)   . Wears glasses     Family History  Problem Relation Age of Onset  . Depression Sister   . Anxiety disorder Sister   . Sexual  abuse Sister   . Dementia Father   . Alcohol abuse Paternal Aunt   . Drug abuse Paternal Uncle   . Bipolar disorder Neg Hx   . OCD Neg Hx   . Paranoid behavior Neg Hx   . Schizophrenia Neg Hx   . Seizures Neg Hx   . Physical abuse Neg Hx   . ADD / ADHD Other   . ADD / ADHD Other   . Healthy Son   . Healthy Son     Past Surgical History:  Procedure Laterality Date  . abdomnial surgery    . ANKLE FRACTURE SURGERY  2000   right  . APPENDECTOMY    . CARDIAC CATHETERIZATION  2008   PE-filter in  . COLONOSCOPY    . DILITATION & CURRETTAGE/HYSTROSCOPY WITH ESSURE    . EYE SURGERY     both cataracts  . HARDWARE REMOVAL Left 02/26/2014   Procedure: REMOVAL HARDWARE ;  Surgeon: Tami Ribas, MD;  Location: [facility];  Service: Orthopedics;  Laterality: Left;  . TONSILLECTOMY    . WRIST SURGERY  2011   may-lt   Social History   Occupational History  . Not on file.   Social History Main Topics  . Smoking status: Former Smoker    Quit date: 02/19/1978  . Smokeless tobacco: Never Used  . Alcohol use No  . Drug use: No  . Sexual activity: No

## 2016-10-25 DIAGNOSIS — M170 Bilateral primary osteoarthritis of knee: Secondary | ICD-10-CM | POA: Diagnosis not present

## 2016-10-25 DIAGNOSIS — R2689 Other abnormalities of gait and mobility: Secondary | ICD-10-CM | POA: Diagnosis not present

## 2016-11-01 ENCOUNTER — Telehealth (INDEPENDENT_AMBULATORY_CARE_PROVIDER_SITE_OTHER): Payer: Self-pay | Admitting: Orthopaedic Surgery

## 2016-11-01 DIAGNOSIS — R2689 Other abnormalities of gait and mobility: Secondary | ICD-10-CM | POA: Diagnosis not present

## 2016-11-01 DIAGNOSIS — M170 Bilateral primary osteoarthritis of knee: Secondary | ICD-10-CM | POA: Diagnosis not present

## 2016-11-01 NOTE — Telephone Encounter (Signed)
LAST OV NOTE FAXED TO [facility] @ [phone] PER THEIR REQUEST

## 2016-11-02 DIAGNOSIS — Z713 Dietary counseling and surveillance: Secondary | ICD-10-CM | POA: Diagnosis not present

## 2016-11-02 DIAGNOSIS — Z299 Encounter for prophylactic measures, unspecified: Secondary | ICD-10-CM | POA: Diagnosis not present

## 2016-11-02 DIAGNOSIS — J309 Allergic rhinitis, unspecified: Secondary | ICD-10-CM | POA: Diagnosis not present

## 2016-11-02 DIAGNOSIS — R0600 Dyspnea, unspecified: Secondary | ICD-10-CM | POA: Diagnosis not present

## 2016-11-02 DIAGNOSIS — Z6832 Body mass index (BMI) 32.0-32.9, adult: Secondary | ICD-10-CM | POA: Diagnosis not present

## 2016-11-02 DIAGNOSIS — I2699 Other pulmonary embolism without acute cor pulmonale: Secondary | ICD-10-CM | POA: Diagnosis not present

## 2016-11-02 DIAGNOSIS — M545 Low back pain: Secondary | ICD-10-CM | POA: Diagnosis not present

## 2016-11-04 DIAGNOSIS — R2689 Other abnormalities of gait and mobility: Secondary | ICD-10-CM | POA: Diagnosis not present

## 2016-11-04 DIAGNOSIS — M170 Bilateral primary osteoarthritis of knee: Secondary | ICD-10-CM | POA: Diagnosis not present

## 2016-11-08 DIAGNOSIS — R2689 Other abnormalities of gait and mobility: Secondary | ICD-10-CM | POA: Diagnosis not present

## 2016-11-08 DIAGNOSIS — M170 Bilateral primary osteoarthritis of knee: Secondary | ICD-10-CM | POA: Diagnosis not present

## 2016-11-16 DIAGNOSIS — M170 Bilateral primary osteoarthritis of knee: Secondary | ICD-10-CM | POA: Diagnosis not present

## 2016-11-16 DIAGNOSIS — R2689 Other abnormalities of gait and mobility: Secondary | ICD-10-CM | POA: Diagnosis not present

## 2016-11-22 DIAGNOSIS — M170 Bilateral primary osteoarthritis of knee: Secondary | ICD-10-CM | POA: Diagnosis not present

## 2016-11-22 DIAGNOSIS — R2689 Other abnormalities of gait and mobility: Secondary | ICD-10-CM | POA: Diagnosis not present

## 2016-11-22 DIAGNOSIS — R0602 Shortness of breath: Secondary | ICD-10-CM | POA: Diagnosis not present

## 2016-11-24 ENCOUNTER — Ambulatory Visit (INDEPENDENT_AMBULATORY_CARE_PROVIDER_SITE_OTHER): Payer: Medicare Other

## 2016-11-24 ENCOUNTER — Encounter (INDEPENDENT_AMBULATORY_CARE_PROVIDER_SITE_OTHER): Payer: Self-pay | Admitting: Orthopaedic Surgery

## 2016-11-24 ENCOUNTER — Ambulatory Visit (INDEPENDENT_AMBULATORY_CARE_PROVIDER_SITE_OTHER): Payer: Medicare Other | Admitting: Orthopaedic Surgery

## 2016-11-24 VITALS — BP 135/79 | HR 103 | Resp 16 | Ht 66.0 in | Wt 220.0 lb

## 2016-11-24 DIAGNOSIS — M25552 Pain in left hip: Secondary | ICD-10-CM

## 2016-11-24 NOTE — Progress Notes (Signed)
Office Visit Note   Patient: Grace Howard           Date of Birth: [birth year]           MRN: [id number] Visit Date: 11/24/2016              Requested by: Ignatius Specking, MD [address] [place], [place] [address] PCP: Ignatius Specking, MD   Assessment & Plan: Visit Diagnoses: Greater trochanteric bursitis left hip   Plan: Local cortisone injection,, follow-up as needed  Follow-Up Instructions: No Follow-up on file.   Orders:  No orders of the defined types were placed in this encounter.  No orders of the defined types were placed in this encounter.     Procedures: Large Joint Inj Date/Time: 11/24/2016 11:19 AM Performed by: Valeria Batman Authorized by: Valeria Batman   Consent Given by:  Patient Timeout: prior to procedure the correct patient, procedure, and site was verified   Indications:  Pain Location:  Hip Site:  L greater trochanter Prep: patient was prepped and draped in usual sterile fashion   Needle Size:  22 G Needle Length:  3.5 inches Approach:  Lateral Ultrasound Guidance: No   Fluoroscopic Guidance: No   Arthrogram: No   Medications:  5 mL lidocaine 1 %; 80 mg methylPREDNISolone acetate 40 MG/ML Aspiration Attempted: No   Patient tolerance:  Patient tolerated the procedure well with no immediate complications     Clinical Data: No additional findings.   Subjective: Chief Complaint  Patient presents with  . Left Hip - Pain    Grace Howard is a 81 year old female that presents today with recurring left hip pain. She states she had cortisone injections in the past for hip bursitis on 07/28/16. She received a cortisone injection in the left knee on 10/20/16. She sees Dr. Otelia Sergeant for her back issues.     Review of Systems   Objective: Vital Signs: BP 135/79   Pulse (!) 103   Resp 16   Ht 5\' 6"  (1.676 m)   Wt 220 lb (99.8 kg)   BMI 35.51 kg/m   Physical Exam  Ortho Exam localized tenderness over the greater trochanter of her left hip  without skin changes. No pain with range of motion of either hip with internal or external rotation.  Specialty Comments:  No specialty comments available.  Imaging: No results found.   PMFS History: Patient Active Problem List   Diagnosis Date Noted  . Insomnia secondary to depression with anxiety 12/06/2012  . Chronic pain syndrome 10/16/2012  . Unspecified vitamin D deficiency 10/16/2012  . MDD (major depressive disorder) 02/15/2012   Past Medical History:  Diagnosis Date  . Anxiety   . Depression   . GERD (gastroesophageal reflux disease)   . Headache(784.0)   . HTN (hypertension)   . Hyperlipidemia   . Insomnia   . Lupus   . Macular degeneration June 2013  . Osteoarthritis June 2013  . Osteoporosis   . PE (pulmonary embolism) 2008   not sure why  . Seizures (HCC)   . Wears glasses     Family History  Problem Relation Age of Onset  . Depression Sister   . Anxiety disorder Sister   . Sexual abuse Sister   . Dementia Father   . Alcohol abuse Paternal Aunt   . Drug abuse Paternal Uncle   . Bipolar disorder Neg Hx   . OCD Neg Hx   . Paranoid behavior Neg  Hx   . Schizophrenia Neg Hx   . Seizures Neg Hx   . Physical abuse Neg Hx   . ADD / ADHD Other   . ADD / ADHD Other   . Healthy Son   . Healthy Son     Past Surgical History:  Procedure Laterality Date  . abdomnial surgery    . ANKLE FRACTURE SURGERY  2000   right  . APPENDECTOMY    . CARDIAC CATHETERIZATION  2008   PE-filter in  . COLONOSCOPY    . DILITATION & CURRETTAGE/HYSTROSCOPY WITH ESSURE    . EYE SURGERY     both cataracts  . HARDWARE REMOVAL Left 02/26/2014   Procedure: REMOVAL HARDWARE ;  Surgeon: Tami Ribas, MD;  Location: [facility];  Service: Orthopedics;  Laterality: Left;  . TONSILLECTOMY    . WRIST SURGERY  2011   may-lt   Social History   Occupational History  . Not on file.   Social History Main Topics  . Smoking status: Former Smoker    Quit date:  02/19/1978  . Smokeless tobacco: Never Used  . Alcohol use No  . Drug use: No  . Sexual activity: No

## 2016-11-25 MED ORDER — METHYLPREDNISOLONE ACETATE 40 MG/ML IJ SUSP
80.0000 mg | INTRAMUSCULAR | Status: AC | PRN
  Administered 2016-11-24: 80 mg

## 2016-11-25 MED ORDER — LIDOCAINE HCL 1 % IJ SOLN
5.0000 mL | INTRAMUSCULAR | Status: AC | PRN
Start: 2016-11-24 — End: 2016-11-24
  Administered 2016-11-24: 5 mL

## 2016-11-29 DIAGNOSIS — M170 Bilateral primary osteoarthritis of knee: Secondary | ICD-10-CM | POA: Diagnosis not present

## 2016-11-29 DIAGNOSIS — R2689 Other abnormalities of gait and mobility: Secondary | ICD-10-CM | POA: Diagnosis not present

## 2016-11-30 DIAGNOSIS — I2699 Other pulmonary embolism without acute cor pulmonale: Secondary | ICD-10-CM | POA: Diagnosis not present

## 2016-11-30 DIAGNOSIS — Z6831 Body mass index (BMI) 31.0-31.9, adult: Secondary | ICD-10-CM | POA: Diagnosis not present

## 2016-11-30 DIAGNOSIS — I10 Essential (primary) hypertension: Secondary | ICD-10-CM | POA: Diagnosis not present

## 2016-11-30 DIAGNOSIS — Z713 Dietary counseling and surveillance: Secondary | ICD-10-CM | POA: Diagnosis not present

## 2016-11-30 DIAGNOSIS — Z299 Encounter for prophylactic measures, unspecified: Secondary | ICD-10-CM | POA: Diagnosis not present

## 2016-12-09 DIAGNOSIS — M170 Bilateral primary osteoarthritis of knee: Secondary | ICD-10-CM | POA: Diagnosis not present

## 2016-12-09 DIAGNOSIS — R2689 Other abnormalities of gait and mobility: Secondary | ICD-10-CM | POA: Diagnosis not present

## 2016-12-13 DIAGNOSIS — R2689 Other abnormalities of gait and mobility: Secondary | ICD-10-CM | POA: Diagnosis not present

## 2016-12-13 DIAGNOSIS — M170 Bilateral primary osteoarthritis of knee: Secondary | ICD-10-CM | POA: Diagnosis not present

## 2016-12-15 ENCOUNTER — Encounter (HOSPITAL_COMMUNITY): Payer: Self-pay | Admitting: Psychiatry

## 2016-12-15 ENCOUNTER — Ambulatory Visit (INDEPENDENT_AMBULATORY_CARE_PROVIDER_SITE_OTHER): Payer: Medicare Other | Admitting: Psychiatry

## 2016-12-15 VITALS — BP 135/85 | HR 89 | Ht 66.0 in | Wt 212.0 lb

## 2016-12-15 DIAGNOSIS — F331 Major depressive disorder, recurrent, moderate: Secondary | ICD-10-CM | POA: Diagnosis not present

## 2016-12-15 MED ORDER — DIAZEPAM 2 MG PO TABS: 2.0000 mg | ORAL_TABLET | Freq: Three times a day (TID) | ORAL | 3 refills | Status: DC | PRN

## 2016-12-15 MED ORDER — ARIPIPRAZOLE 10 MG PO TABS: 10.0000 mg | ORAL_TABLET | Freq: Every day | ORAL | 3 refills | Status: DC

## 2016-12-15 MED ORDER — DULOXETINE HCL 60 MG PO CPEP: 60.0000 mg | ORAL_CAPSULE | Freq: Two times a day (BID) | ORAL | 3 refills | Status: DC
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
## 2016-12-20 ENCOUNTER — Ambulatory Visit (HOSPITAL_COMMUNITY): Payer: Self-pay | Admitting: Psychiatry

## 2016-12-24 ENCOUNTER — Encounter: Payer: Self-pay | Admitting: *Deleted

## 2016-12-27 ENCOUNTER — Ambulatory Visit (INDEPENDENT_AMBULATORY_CARE_PROVIDER_SITE_OTHER): Payer: Medicare Other | Admitting: Cardiology

## 2016-12-27 ENCOUNTER — Encounter: Payer: Self-pay | Admitting: Cardiology

## 2016-12-27 ENCOUNTER — Telehealth: Payer: Self-pay | Admitting: *Deleted

## 2016-12-27 ENCOUNTER — Telehealth: Payer: Self-pay | Admitting: Cardiology

## 2016-12-27 ENCOUNTER — Encounter: Payer: Self-pay | Admitting: *Deleted

## 2016-12-27 VITALS — BP 112/76 | HR 96 | Ht 66.0 in | Wt 214.0 lb

## 2016-12-27 DIAGNOSIS — R0602 Shortness of breath: Secondary | ICD-10-CM

## 2016-12-27 DIAGNOSIS — I2510 Atherosclerotic heart disease of native coronary artery without angina pectoris: Secondary | ICD-10-CM | POA: Diagnosis not present

## 2016-12-27 NOTE — Telephone Encounter (Signed)
Grace Howard May 14th arrive at 1015

## 2016-12-27 NOTE — Progress Notes (Signed)
Clinical Summary Grace Howard is a 81 y.o.female seen as new patient, she is referred by Dr Sherril Croon for history of CAD.   1. CAD - cath 2008 showed borderline CAD per pcp notes, only a D2 50% lesion is mentioned. Do not have cath report at this time.  - SOB started about 1 year ago. Noted when working with PT. Only occurs with activity. Can occur walking room to room at home - no chest pain. No recent edema. No coughing or wheezing. Former smoker x 20 years. Reports PFTs long time ago - has had some recent SOB   2. History of PE - diagnosed in 2008, has been on coumadin since that time  3. SLE  4. SOB - echo 11/2016 at [facility]: LVEF 60-65%, grade I diastolic dysfunction, normal IVC - reports increasing SOB over the last several weeks   Past Medical History:  Diagnosis Date  . Anxiety   . Depression   . GERD (gastroesophageal reflux disease)   . Headache(784.0)   . HTN (hypertension)   . Hyperlipidemia   . Insomnia   . Lupus   . Macular degeneration June 2013  . Osteoarthritis June 2013  . Osteoporosis   . PE (pulmonary embolism) 2008   not sure why  . Seizures (HCC)   . Wears glasses      Allergies  Allergen Reactions  . Tegretol [Carbamazepine] Other (See Comments)    Thought to have caused her Lupus     Current Outpatient Prescriptions  Medication Sig Dispense Refill  . amLODipine (NORVASC) 5 MG tablet Take 5 mg by mouth daily.    . ARIPiprazole (ABILIFY) 10 MG tablet Take 1 tablet (10 mg total) by mouth daily. 30 tablet 3  . benazepril (LOTENSIN) 20 MG tablet     . Calcium Carbonate-Vitamin D (CALCIUM + D PO) Take by mouth 2 (two) times daily.    . celecoxib (CELEBREX) 100 MG capsule Take 1 capsule (100 mg total) by mouth 2 (two) times daily. 60 capsule 6  . clotrimazole-betamethasone (LOTRISONE) cream     . diazepam (VALIUM) 2 MG tablet Take 1 tablet (2 mg total) by mouth 3 (three) times daily as needed for anxiety. 90 tablet 3  . diclofenac  sodium (VOLTAREN) 1 % GEL Apply topically 4 (four) times daily.    . DULoxetine (CYMBALTA) 60 MG capsule Take 1 capsule (60 mg total) by mouth 2 (two) times daily. 60 capsule 3  . fluticasone (FLONASE) 50 MCG/ACT nasal spray Place 2 sprays into both nostrils daily.    Marland Kitchen HYDROcodone-acetaminophen (NORCO/VICODIN) 5-325 MG per tablet Take 1-1.5 tablets by mouth 2 (two) times daily as needed.     . hydroxychloroquine (PLAQUENIL) 200 MG tablet Take 300 mg by mouth daily.     Marland Kitchen levETIRAcetam (KEPPRA) 500 MG tablet Take 500 mg by mouth daily.     . metoprolol tartrate (LOPRESSOR) 25 MG tablet Take 25 mg by mouth 2 (two) times daily.    . Multiple Vitamin (MULTIVITAMIN) capsule Take 1 capsule by mouth daily.    Marland Kitchen omega-3 acid ethyl esters (LOVAZA) 1 G capsule Take 2 g by mouth daily.    . pantoprazole (PROTONIX) 40 MG tablet Take 40 mg by mouth daily.    . pravastatin (PRAVACHOL) 80 MG tablet Take 80 mg by mouth daily.    Marland Kitchen tiZANidine (ZANAFLEX) 2 MG tablet Take 2 mg by mouth 2 (two) times daily as needed for muscle spasms.    Marland Kitchen  traMADol (ULTRAM) 50 MG tablet     . warfarin (COUMADIN) 2 MG tablet Take by mouth as directed.      No current facility-administered medications for this visit.      Past Surgical History:  Procedure Laterality Date  . abdomnial surgery    . ANKLE FRACTURE SURGERY  2000   right  . APPENDECTOMY    . CARDIAC CATHETERIZATION  2008   PE-filter in  . COLONOSCOPY    . DILITATION & CURRETTAGE/HYSTROSCOPY WITH ESSURE    . EYE SURGERY     both cataracts  . HARDWARE REMOVAL Left 02/26/2014   Procedure: REMOVAL HARDWARE ;  Surgeon: Tami Ribas, MD;  Location: [facility];  Service: Orthopedics;  Laterality: Left;  . TONSILLECTOMY    . WRIST SURGERY  2011   may-lt     Allergies  Allergen Reactions  . Tegretol [Carbamazepine] Other (See Comments)    Thought to have caused her Lupus      Family History  Problem Relation Age of Onset  . Dementia  Father   . ADD / ADHD Other   . ADD / ADHD Other   . Healthy Son   . Healthy Son   . Depression Sister   . Anxiety disorder Sister   . Sexual abuse Sister   . Alcohol abuse Paternal Aunt   . Drug abuse Paternal Uncle   . Bipolar disorder Neg Hx   . OCD Neg Hx   . Paranoid behavior Neg Hx   . Schizophrenia Neg Hx   . Seizures Neg Hx   . Physical abuse Neg Hx      Social History Grace Howard reports that she quit smoking about 38 years ago. She has never used smokeless tobacco. Grace Howard reports that she does not drink alcohol.   Review of Systems CONSTITUTIONAL: No weight loss, fever, chills, weakness or fatigue.  HEENT: Eyes: No visual loss, blurred vision, double vision or yellow sclerae.No hearing loss, sneezing, congestion, runny nose or sore throat.  SKIN: No rash or itching.  CARDIOVASCULAR: per HPI RESPIRATORY: per HPI GASTROINTESTINAL: No anorexia, nausea, vomiting or diarrhea. No abdominal pain or blood.  GENITOURINARY: No burning on urination, no polyuria NEUROLOGICAL: No headache, dizziness, syncope, paralysis, ataxia, numbness or tingling in the extremities. No change in bowel or bladder control.  MUSCULOSKELETAL: No muscle, back pain, joint pain or stiffness.  LYMPHATICS: No enlarged nodes. No history of splenectomy.  PSYCHIATRIC: No history of depression or anxiety.  ENDOCRINOLOGIC: No reports of sweating, cold or heat intolerance. No polyuria or polydipsia.  Marland Kitchen   Physical Examination Vitals:   12/27/16 1349 12/27/16 1356  BP: 132/79 112/76  Pulse: 96 96   Vitals:   12/27/16 1349  Weight: 214 lb (97.1 kg)  Height: 5\' 6"  (1.676 m)    Gen: resting comfortably, no acute distress HEENT: no scleral icterus, pupils equal round and reactive, no palptable cervical adenopathy,  CV: RRR, no m/r/g no jvd Resp: Clear to auscultation bilaterally GI: abdomen is soft, non-tender, non-distended, normal bowel sounds, no hepatosplenomegaly MSK: extremities are warm, no  edema.  Skin: warm, no rash Neuro:  no focal deficits Psych: appropriate affect      Assessment and Plan  1. CAD - no recent chest pain, but has had some recent SOB. EKG in clinic shows SR, no ischemic changes - we will obtain lexiscan MPI to further evaluate  2. History of PE - continue coumadin per pcp    F/u  pending stress results      Antoine Poche, M.D.,

## 2016-12-27 NOTE — Patient Instructions (Signed)
Your physician recommends that you schedule a follow-up appointment TO BE DETERMINED AFTER TESTING WITH DR [facility]  Your physician recommends that you continue on your current medications as directed. Please refer to the Current Medication list given to you today.  Your physician has requested that you have a lexiscan myoview. For further information please visit [email or web address] Please follow instruction sheet, as given.  Thank you for choosing [facility]!!

## 2016-12-27 NOTE — Telephone Encounter (Signed)
Pt voiced understanding of holding amlodipine the morning of stress test - also wrote out on instruction letter

## 2016-12-28 DIAGNOSIS — R569 Unspecified convulsions: Secondary | ICD-10-CM | POA: Diagnosis not present

## 2016-12-28 DIAGNOSIS — M329 Systemic lupus erythematosus, unspecified: Secondary | ICD-10-CM | POA: Diagnosis not present

## 2016-12-28 DIAGNOSIS — I4891 Unspecified atrial fibrillation: Secondary | ICD-10-CM | POA: Diagnosis not present

## 2016-12-28 DIAGNOSIS — M170 Bilateral primary osteoarthritis of knee: Secondary | ICD-10-CM | POA: Diagnosis not present

## 2016-12-28 DIAGNOSIS — Z87891 Personal history of nicotine dependence: Secondary | ICD-10-CM | POA: Diagnosis not present

## 2016-12-28 DIAGNOSIS — Z6832 Body mass index (BMI) 32.0-32.9, adult: Secondary | ICD-10-CM | POA: Diagnosis not present

## 2016-12-28 DIAGNOSIS — K219 Gastro-esophageal reflux disease without esophagitis: Secondary | ICD-10-CM | POA: Diagnosis not present

## 2016-12-28 DIAGNOSIS — M810 Age-related osteoporosis without current pathological fracture: Secondary | ICD-10-CM | POA: Diagnosis not present

## 2016-12-28 DIAGNOSIS — E785 Hyperlipidemia, unspecified: Secondary | ICD-10-CM | POA: Diagnosis not present

## 2016-12-28 DIAGNOSIS — I2699 Other pulmonary embolism without acute cor pulmonale: Secondary | ICD-10-CM | POA: Diagnosis not present

## 2016-12-28 DIAGNOSIS — I10 Essential (primary) hypertension: Secondary | ICD-10-CM | POA: Diagnosis not present

## 2016-12-28 DIAGNOSIS — R2689 Other abnormalities of gait and mobility: Secondary | ICD-10-CM | POA: Diagnosis not present

## 2016-12-28 DIAGNOSIS — Z299 Encounter for prophylactic measures, unspecified: Secondary | ICD-10-CM | POA: Diagnosis not present

## 2016-12-29 ENCOUNTER — Encounter (INDEPENDENT_AMBULATORY_CARE_PROVIDER_SITE_OTHER): Payer: Self-pay | Admitting: Orthopaedic Surgery

## 2016-12-29 ENCOUNTER — Ambulatory Visit (INDEPENDENT_AMBULATORY_CARE_PROVIDER_SITE_OTHER): Payer: Medicare Other | Admitting: Orthopaedic Surgery

## 2016-12-29 VITALS — Ht 66.0 in | Wt 220.0 lb

## 2016-12-29 DIAGNOSIS — G8929 Other chronic pain: Secondary | ICD-10-CM

## 2016-12-29 DIAGNOSIS — M25562 Pain in left knee: Secondary | ICD-10-CM

## 2016-12-29 MED ORDER — BUPIVACAINE HCL 0.5 % IJ SOLN
3.0000 mL | INTRAMUSCULAR | Status: AC | PRN
  Administered 2016-12-29: 3 mL via INTRA_ARTICULAR

## 2016-12-29 MED ORDER — LIDOCAINE HCL 1 % IJ SOLN
5.0000 mL | INTRAMUSCULAR | Status: AC | PRN
  Administered 2016-12-29: 5 mL

## 2016-12-29 MED ORDER — METHYLPREDNISOLONE ACETATE 40 MG/ML IJ SUSP
80.0000 mg | INTRAMUSCULAR | Status: AC | PRN
  Administered 2016-12-29: 80 mg

## 2016-12-29 NOTE — Progress Notes (Signed)
Office Visit Note   Patient: Grace Howard           Date of Birth: 17-Jan-1936           MRN: [id number] Visit Date: 12/29/2016              Requested by: Ignatius Specking, MD [address] [place], [place] [address] PCP: Ignatius Specking, MD   Assessment & Plan: Visit Diagnoses:  1. Chronic pain of left knee    Osteoarthritis left knee predominantly in the lateral compartment Plan: Cortisone injection lateral compartment, return to office in 4-6 weeks if interested and Visco supplementation. Long discussion regarding pathology and different treatment options. She had a prior nondisplaced medial tibial plateau fracture that is healed without incident. She does have an issue with balance and does use a cane  Follow-Up Instructions: Return if symptoms worsen or fail to improve.   Orders:  No orders of the defined types were placed in this encounter.  No orders of the defined types were placed in this encounter.     Procedures: Large Joint Inj Date/Time: 12/29/2016 4:11 PM Performed by: Valeria Batman Authorized by: Valeria Batman   Consent Given by:  Patient Timeout: prior to procedure the correct patient, procedure, and site was verified   Indications:  Pain and joint swelling Location:  Knee Site:  L knee Prep: patient was prepped and draped in usual sterile fashion   Needle Size:  25 G Needle Length:  1.5 inches Approach:  Anteromedial Ultrasound Guidance: No   Fluoroscopic Guidance: No   Arthrogram: No   Medications:  5 mL lidocaine 1 %; 80 mg methylPREDNISolone acetate 40 MG/ML; 3 mL bupivacaine 0.5 % Aspiration Attempted: No   Patient tolerance:  Patient tolerated the procedure well with no immediate complications     Clinical Data: No additional findings.   Subjective: Chief Complaint  Patient presents with  . Left Knee - Pain    Grace Howard is a 81 y o that presents with chronic left knee pain. She relates the pain has been x 2 months.  Pt relates she fell a  weeks ago and just "gave way" and landed on carpet. Moderate pain, used ice/heat/elevation. No swelling, fever or chills. Sh  No history of fever or chills. She's had several falls as mentioned above. Of significance is a nondisplaced medial tibial plateau fracture previously treated without surgery that seems to feel without complications. She's not experiencing any swelling or discomfort medially but rather along the lateral compartment of her knee. She's had prior films demonstrating slight increased varus and not significant degenerative changes either medially or laterally. Last films were performed in August 2017. I believe she's had prior cortisone injections with good relief. The chart indicates that she's had a prior injection about 2 months ago  HPI  Review of Systems   Objective: Vital Signs: Ht 5\' 6"  (1.676 m)   Wt 220 lb (99.8 kg)   BMI 35.51 kg/m   Physical Exam  Ortho Exam Left knee exam with no effusion. Predominantly lateral joint pain. No medial joint pain. Mild patellar crepitation. Full extension. Flexion over 100 without instability. No pain along the proximal medial tibia. No calf pain. No distal edema. Motor exam intact. No pain with range of motion of either hip. Specialty Comments:  No specialty comments available.  Imaging: No results found.   PMFS History: Patient Active Problem List   Diagnosis Date Noted  . Insomnia secondary to  depression with anxiety 12/06/2012  . Chronic pain syndrome 10/16/2012  . Unspecified vitamin D deficiency 10/16/2012  . MDD (major depressive disorder) 02/15/2012   Past Medical History:  Diagnosis Date  . Anxiety   . Depression   . GERD (gastroesophageal reflux disease)   . Headache(784.0)   . HTN (hypertension)   . Hyperlipidemia   . Insomnia   . Lupus   . Macular degeneration June 2013  . Osteoarthritis June 2013  . Osteoporosis   . PE (pulmonary embolism) 2008   not sure why  . Seizures (HCC)   . Wears  glasses     Family History  Problem Relation Age of Onset  . Dementia Father   . ADD / ADHD Other   . ADD / ADHD Other   . Healthy Son   . Healthy Son   . Depression Sister   . Anxiety disorder Sister   . Sexual abuse Sister   . Alcohol abuse Paternal Aunt   . Drug abuse Paternal Uncle   . Bipolar disorder Neg Hx   . OCD Neg Hx   . Paranoid behavior Neg Hx   . Schizophrenia Neg Hx   . Seizures Neg Hx   . Physical abuse Neg Hx     Past Surgical History:  Procedure Laterality Date  . abdomnial surgery    . ANKLE FRACTURE SURGERY  2000   right  . APPENDECTOMY    . CARDIAC CATHETERIZATION  2008   PE-filter in  . COLONOSCOPY    . DILITATION & CURRETTAGE/HYSTROSCOPY WITH ESSURE    . EYE SURGERY     both cataracts  . HARDWARE REMOVAL Left 02/26/2014   Procedure: REMOVAL HARDWARE ;  Surgeon: Tami Ribas, MD;  Location: [facility];  Service: Orthopedics;  Laterality: Left;  . TONSILLECTOMY    . WRIST SURGERY  2011   may-lt   Social History   Occupational History  . Not on file.   Social History Main Topics  . Smoking status: Former Smoker    Quit date: 02/19/1978  . Smokeless tobacco: Never Used  . Alcohol use No  . Drug use: No  . Sexual activity: No     Valeria Batman, MD   Note - This record has been created using [employer].  Chart creation errors have been sought, but may not always  have been located. Such creation errors do not reflect on  the standard of medical care.

## 2017-01-11 ENCOUNTER — Other Ambulatory Visit (HOSPITAL_COMMUNITY): Payer: Self-pay | Admitting: Psychiatry

## 2017-01-17 ENCOUNTER — Encounter (HOSPITAL_COMMUNITY)
Admission: RE | Admit: 2017-01-17 | Discharge: 2017-01-17 | Disposition: A | Discharge status: Home / Self Care | Payer: Medicare Other | Source: Ambulatory Visit | Attending: Cardiology | Admitting: Cardiology

## 2017-01-17 ENCOUNTER — Encounter (HOSPITAL_BASED_OUTPATIENT_CLINIC_OR_DEPARTMENT_OTHER)
Admission: RE | Admit: 2017-01-17 | Discharge: 2017-01-17 | Disposition: A | Discharge status: Home / Self Care | Payer: Medicare Other | Source: Ambulatory Visit | Attending: Cardiology | Admitting: Cardiology

## 2017-01-17 ENCOUNTER — Encounter (HOSPITAL_COMMUNITY): Payer: Self-pay

## 2017-01-17 DIAGNOSIS — R0602 Shortness of breath: Secondary | ICD-10-CM

## 2017-01-17 LAB — NM MYOCAR MULTI W/SPECT W/WALL MOTION / EF
CHL CUP NUCLEAR SDS: 1
CHL CUP NUCLEAR SRS: 0
CHL CUP NUCLEAR SSS: 1
CSEPPHR: 91 {beats}/min
LV dias vol: 63 mL (ref 46–106)
LV sys vol: 24 mL
RATE: 0.25
Rest HR: 69 {beats}/min
TID: 0.93

## 2017-01-17 MED ORDER — TECHNETIUM TC 99M TETROFOSMIN IV KIT
10.0000 | PACK | Freq: Once | INTRAVENOUS | Status: AC | PRN
  Administered 2017-01-17: 11 via INTRAVENOUS

## 2017-01-17 MED ORDER — SODIUM CHLORIDE 0.9% FLUSH
INTRAVENOUS | Status: AC
Start: 2017-01-17 — End: 2017-01-17
  Administered 2017-01-17: 10 mL via INTRAVENOUS
  Filled 2017-01-17: qty 10

## 2017-01-17 MED ORDER — TECHNETIUM TC 99M TETROFOSMIN IV KIT
30.0000 | PACK | Freq: Once | INTRAVENOUS | Status: AC | PRN
  Administered 2017-01-17: 30 via INTRAVENOUS

## 2017-01-17 MED ORDER — REGADENOSON 0.4 MG/5ML IV SOLN
INTRAVENOUS | Status: AC
  Administered 2017-01-17: 0.4 mg via INTRAVENOUS
  Filled 2017-01-17: qty 5

## 2017-01-19 ENCOUNTER — Telehealth: Payer: Self-pay | Admitting: *Deleted

## 2017-01-19 NOTE — Telephone Encounter (Signed)
Pt aware - 4 month f/u scheduled - routed to pcp  

## 2017-01-19 NOTE — Telephone Encounter (Signed)
-----   Message from Antoine Poche, MD sent at 01/18/2017  4:07 PM EDT ----- Stress test looks good, no evidence of blockages. Can f/u in 4 months  J BrancH MD

## 2017-01-27 DIAGNOSIS — I2699 Other pulmonary embolism without acute cor pulmonale: Secondary | ICD-10-CM | POA: Diagnosis not present

## 2017-01-27 DIAGNOSIS — R569 Unspecified convulsions: Secondary | ICD-10-CM | POA: Diagnosis not present

## 2017-01-27 DIAGNOSIS — M810 Age-related osteoporosis without current pathological fracture: Secondary | ICD-10-CM | POA: Diagnosis not present

## 2017-01-27 DIAGNOSIS — I10 Essential (primary) hypertension: Secondary | ICD-10-CM | POA: Diagnosis not present

## 2017-01-27 DIAGNOSIS — K219 Gastro-esophageal reflux disease without esophagitis: Secondary | ICD-10-CM | POA: Diagnosis not present

## 2017-01-27 DIAGNOSIS — Z299 Encounter for prophylactic measures, unspecified: Secondary | ICD-10-CM | POA: Diagnosis not present

## 2017-01-27 DIAGNOSIS — E785 Hyperlipidemia, unspecified: Secondary | ICD-10-CM | POA: Diagnosis not present

## 2017-01-27 DIAGNOSIS — I4891 Unspecified atrial fibrillation: Secondary | ICD-10-CM | POA: Diagnosis not present

## 2017-01-27 DIAGNOSIS — R739 Hyperglycemia, unspecified: Secondary | ICD-10-CM | POA: Diagnosis not present

## 2017-02-02 ENCOUNTER — Ambulatory Visit (INDEPENDENT_AMBULATORY_CARE_PROVIDER_SITE_OTHER): Payer: Medicare Other | Admitting: Orthopaedic Surgery

## 2017-02-03 DIAGNOSIS — I4891 Unspecified atrial fibrillation: Secondary | ICD-10-CM | POA: Diagnosis not present

## 2017-02-03 DIAGNOSIS — I2510 Atherosclerotic heart disease of native coronary artery without angina pectoris: Secondary | ICD-10-CM | POA: Diagnosis not present

## 2017-02-03 DIAGNOSIS — M159 Polyosteoarthritis, unspecified: Secondary | ICD-10-CM | POA: Diagnosis not present

## 2017-02-03 DIAGNOSIS — I10 Essential (primary) hypertension: Secondary | ICD-10-CM | POA: Diagnosis not present

## 2017-02-09 ENCOUNTER — Ambulatory Visit (INDEPENDENT_AMBULATORY_CARE_PROVIDER_SITE_OTHER): Payer: Medicare Other | Admitting: Orthopaedic Surgery

## 2017-02-09 ENCOUNTER — Encounter (INDEPENDENT_AMBULATORY_CARE_PROVIDER_SITE_OTHER): Payer: Self-pay | Admitting: Orthopaedic Surgery

## 2017-02-09 VITALS — BP 118/78 | HR 92 | Ht 66.0 in | Wt 200.0 lb

## 2017-02-09 DIAGNOSIS — M25552 Pain in left hip: Secondary | ICD-10-CM

## 2017-02-09 MED ORDER — LIDOCAINE HCL 1 % IJ SOLN
5.0000 mL | INTRAMUSCULAR | Status: AC | PRN
  Administered 2017-02-09: 5 mL

## 2017-02-09 MED ORDER — METHYLPREDNISOLONE ACETATE 40 MG/ML IJ SUSP
80.0000 mg | INTRAMUSCULAR | Status: AC | PRN
  Administered 2017-02-09: 80 mg

## 2017-02-09 NOTE — Progress Notes (Signed)
Office Visit Note   Patient: Grace Howard           Date of Birth: 1935-09-27           MRN: [id number] Visit Date: 02/09/2017              Requested by: Ignatius Specking, MD [address] [place], [place] [address] PCP: Ignatius Specking, MD   Assessment & Plan: Visit Diagnoses:  1. Pain of left hip joint   Recurrent greater trochanteric bursitis left hip  Plan: Repeat cortisone injection, return as needed  Follow-Up Instructions: Return if symptoms worsen or fail to improve.   Orders:  No orders of the defined types were placed in this encounter.  No orders of the defined types were placed in this encounter.     Procedures: Large Joint Inj Date/Time: 02/09/2017 3:24 PM Performed by: Valeria Batman Authorized by: Valeria Batman   Consent Given by:  Patient Timeout: prior to procedure the correct patient, procedure, and site was verified   Indications:  Pain Location:  Hip Site:  L greater trochanter Prep: patient was prepped and draped in usual sterile fashion   Needle Size:  22 G Needle Length:  3.5 inches Approach:  Lateral Ultrasound Guidance: No   Fluoroscopic Guidance: No   Arthrogram: No   Medications:  5 mL lidocaine 1 %; 80 mg methylPREDNISolone acetate 40 MG/ML Aspiration Attempted: No   Patient tolerance:  Patient tolerated the procedure well with no immediate complications     Clinical Data: No additional findings.   Subjective: Chief Complaint  Patient presents with  . Left Hip - Pain    Ms. Grace Howard is a 81 y o that presents with L hip pain. Last visit she received a L kneeinjection and she relates her knee is better. No radiating pain or calf pain.  Recurrent pain in same location as previously. Cortisone injection over greater trochanter left hip made a big difference. Wishes to have another groin pain no static and back pain.  HPI  Review of Systems   Objective: Vital Signs: BP 118/78   Pulse 92   Ht 5\' 6"  (1.676 m)   Wt 200 lb (90.7 kg)    BMI 32.28 kg/m   Physical Exam  Ortho Exam tenderness directly over the greater trochanter left hip. No groin pain with range of motion. Straight leg raise negative. No back pain.  Specialty Comments:  No specialty comments available.  Imaging: No results found.   PMFS History: Patient Active Problem List   Diagnosis Date Noted  . Insomnia secondary to depression with anxiety 12/06/2012  . Chronic pain syndrome 10/16/2012  . Unspecified vitamin D deficiency 10/16/2012  . MDD (major depressive disorder) 02/15/2012   Past Medical History:  Diagnosis Date  . Anxiety   . Depression   . GERD (gastroesophageal reflux disease)   . Headache(784.0)   . HTN (hypertension)   . Hyperlipidemia   . Insomnia   . Lupus   . Macular degeneration June 2013  . Osteoarthritis June 2013  . Osteoporosis   . PE (pulmonary embolism) 2008   not sure why  . Seizures (HCC)   . Wears glasses     Family History  Problem Relation Age of Onset  . Dementia Father   . ADD / ADHD Other   . ADD / ADHD Other   . Healthy Son   . Healthy Son   . Depression Sister   . Anxiety disorder  Sister   . Sexual abuse Sister   . Alcohol abuse Paternal Aunt   . Drug abuse Paternal Uncle   . Bipolar disorder Neg Hx   . OCD Neg Hx   . Paranoid behavior Neg Hx   . Schizophrenia Neg Hx   . Seizures Neg Hx   . Physical abuse Neg Hx     Past Surgical History:  Procedure Laterality Date  . abdomnial surgery    . ANKLE FRACTURE SURGERY  2000   right  . APPENDECTOMY    . CARDIAC CATHETERIZATION  2008   PE-filter in  . COLONOSCOPY    . DILITATION & CURRETTAGE/HYSTROSCOPY WITH ESSURE    . EYE SURGERY     both cataracts  . HARDWARE REMOVAL Left 02/26/2014   Procedure: REMOVAL HARDWARE ;  Surgeon: Tami Ribas, MD;  Location: [facility];  Service: Orthopedics;  Laterality: Left;  . TONSILLECTOMY    . WRIST SURGERY  2011   may-lt   Social History   Occupational History  . Not on file.    Social History Main Topics  . Smoking status: Former Smoker    Quit date: 02/19/1978  . Smokeless tobacco: Never Used  . Alcohol use No  . Drug use: No  . Sexual activity: No     Valeria Batman, MD   Note - This record has been created using [employer].  Chart creation errors have been sought, but may not always  have been located. Such creation errors do not reflect on  the standard of medical care.

## 2017-02-10 ENCOUNTER — Telehealth (HOSPITAL_COMMUNITY): Payer: Self-pay | Admitting: *Deleted

## 2017-02-10 NOTE — Telephone Encounter (Signed)
Octavia please call her to see what is going on

## 2017-02-10 NOTE — Telephone Encounter (Signed)
Called pt to get more information per previous message. Per pt, there is nothing that is wrong with her Abilify. Per pt, she is going through some things right now and Dr. Tenny Craw is aware of if and her anxiety has gotten worse. Per pt, is it ok for her to take an increased dose of her Ability to help with what she is going through right now. Informed pt that message will be put back to provider and staff will call her back 02-11-2017 and pt agreed.

## 2017-02-10 NOTE — Telephone Encounter (Signed)
phone call from patient, she need help with the Abilify.   She is having a terrible time, and she wants to know if she can increase the Abilify or if something else will help.

## 2017-02-11 ENCOUNTER — Other Ambulatory Visit (HOSPITAL_COMMUNITY): Payer: Self-pay | Admitting: Psychiatry

## 2017-02-11 MED ORDER — ARIPIPRAZOLE 10 MG PO TABS: 15.0000 mg | ORAL_TABLET | Freq: Every day | ORAL | 3 refills | Status: DC

## 2017-02-11 NOTE — Telephone Encounter (Signed)
Spoke with pt and informed her with new mg for script and sig. Informed pt provider would like to see her sooner and an appt was made.

## 2017-02-11 NOTE — Telephone Encounter (Signed)
15 mg Abilify sent in, take once daily, also get her in to see me sooner

## 2017-02-13 ENCOUNTER — Other Ambulatory Visit (HOSPITAL_COMMUNITY): Payer: Self-pay | Admitting: Psychiatry

## 2017-02-14 ENCOUNTER — Telehealth (HOSPITAL_COMMUNITY): Payer: Self-pay | Admitting: *Deleted

## 2017-02-14 NOTE — Telephone Encounter (Signed)
voice message from patient on 02/12/17 at 2:10 p.m.  She said her pharmacy has not received her prescription for her medicines.

## 2017-02-14 NOTE — Telephone Encounter (Signed)
Spoke with pt and informed her that Dr. Tenny Craw did send her Abilify to the pharmacy and it was sent to [facility]. Per pt when she called them on Saturday they told he they did not have it. Informed pt that per her chart it looks like the pharmacy confirmed they received it at 10:08 on June 8th and pt state she will call pharmacy back

## 2017-02-16 DIAGNOSIS — I2510 Atherosclerotic heart disease of native coronary artery without angina pectoris: Secondary | ICD-10-CM | POA: Diagnosis not present

## 2017-02-16 DIAGNOSIS — I10 Essential (primary) hypertension: Secondary | ICD-10-CM | POA: Diagnosis not present

## 2017-02-16 DIAGNOSIS — M159 Polyosteoarthritis, unspecified: Secondary | ICD-10-CM | POA: Diagnosis not present

## 2017-02-16 DIAGNOSIS — I4891 Unspecified atrial fibrillation: Secondary | ICD-10-CM | POA: Diagnosis not present

## 2017-02-23 ENCOUNTER — Encounter (HOSPITAL_COMMUNITY): Payer: Self-pay | Admitting: Psychiatry

## 2017-02-23 ENCOUNTER — Ambulatory Visit (INDEPENDENT_AMBULATORY_CARE_PROVIDER_SITE_OTHER): Payer: Medicare Other | Admitting: Psychiatry

## 2017-02-23 VITALS — BP 146/74 | HR 97 | Ht 66.0 in | Wt 204.8 lb

## 2017-02-23 DIAGNOSIS — I2510 Atherosclerotic heart disease of native coronary artery without angina pectoris: Secondary | ICD-10-CM

## 2017-02-23 DIAGNOSIS — F331 Major depressive disorder, recurrent, moderate: Secondary | ICD-10-CM | POA: Diagnosis not present

## 2017-02-23 MED ORDER — DULOXETINE HCL 60 MG PO CPEP: 60.0000 mg | ORAL_CAPSULE | Freq: Two times a day (BID) | ORAL | 3 refills | Status: DC

## 2017-02-23 MED ORDER — DIAZEPAM 2 MG PO TABS: 2.0000 mg | ORAL_TABLET | Freq: Three times a day (TID) | ORAL | 3 refills | Status: DC | PRN
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
## 2017-02-24 ENCOUNTER — Ambulatory Visit (HOSPITAL_COMMUNITY): Payer: Self-pay | Admitting: Psychiatry

## 2017-02-25 DIAGNOSIS — M810 Age-related osteoporosis without current pathological fracture: Secondary | ICD-10-CM | POA: Diagnosis not present

## 2017-02-25 DIAGNOSIS — I4891 Unspecified atrial fibrillation: Secondary | ICD-10-CM | POA: Diagnosis not present

## 2017-02-25 DIAGNOSIS — F329 Major depressive disorder, single episode, unspecified: Secondary | ICD-10-CM | POA: Diagnosis not present

## 2017-02-25 DIAGNOSIS — E785 Hyperlipidemia, unspecified: Secondary | ICD-10-CM | POA: Diagnosis not present

## 2017-02-25 DIAGNOSIS — R569 Unspecified convulsions: Secondary | ICD-10-CM | POA: Diagnosis not present

## 2017-02-25 DIAGNOSIS — I10 Essential (primary) hypertension: Secondary | ICD-10-CM | POA: Diagnosis not present

## 2017-02-25 DIAGNOSIS — Z299 Encounter for prophylactic measures, unspecified: Secondary | ICD-10-CM | POA: Diagnosis not present

## 2017-02-25 DIAGNOSIS — Z6831 Body mass index (BMI) 31.0-31.9, adult: Secondary | ICD-10-CM | POA: Diagnosis not present

## 2017-02-25 DIAGNOSIS — I2699 Other pulmonary embolism without acute cor pulmonale: Secondary | ICD-10-CM | POA: Diagnosis not present

## 2017-03-16 ENCOUNTER — Ambulatory Visit (HOSPITAL_COMMUNITY): Payer: Self-pay | Admitting: Psychiatry

## 2017-03-24 DIAGNOSIS — E785 Hyperlipidemia, unspecified: Secondary | ICD-10-CM | POA: Diagnosis not present

## 2017-03-24 DIAGNOSIS — Z299 Encounter for prophylactic measures, unspecified: Secondary | ICD-10-CM | POA: Diagnosis not present

## 2017-03-24 DIAGNOSIS — I10 Essential (primary) hypertension: Secondary | ICD-10-CM | POA: Diagnosis not present

## 2017-03-24 DIAGNOSIS — F329 Major depressive disorder, single episode, unspecified: Secondary | ICD-10-CM | POA: Diagnosis not present

## 2017-03-24 DIAGNOSIS — M545 Low back pain: Secondary | ICD-10-CM | POA: Diagnosis not present

## 2017-03-24 DIAGNOSIS — R569 Unspecified convulsions: Secondary | ICD-10-CM | POA: Diagnosis not present

## 2017-03-24 DIAGNOSIS — M810 Age-related osteoporosis without current pathological fracture: Secondary | ICD-10-CM | POA: Diagnosis not present

## 2017-03-24 DIAGNOSIS — I2699 Other pulmonary embolism without acute cor pulmonale: Secondary | ICD-10-CM | POA: Diagnosis not present

## 2017-04-04 DIAGNOSIS — I2510 Atherosclerotic heart disease of native coronary artery without angina pectoris: Secondary | ICD-10-CM | POA: Diagnosis not present

## 2017-04-04 DIAGNOSIS — I10 Essential (primary) hypertension: Secondary | ICD-10-CM | POA: Diagnosis not present

## 2017-04-04 DIAGNOSIS — M159 Polyosteoarthritis, unspecified: Secondary | ICD-10-CM | POA: Diagnosis not present

## 2017-04-04 DIAGNOSIS — I4891 Unspecified atrial fibrillation: Secondary | ICD-10-CM | POA: Diagnosis not present

## 2017-04-12 ENCOUNTER — Other Ambulatory Visit (INDEPENDENT_AMBULATORY_CARE_PROVIDER_SITE_OTHER): Payer: Self-pay | Admitting: Specialist

## 2017-04-12 NOTE — Telephone Encounter (Signed)
Celecoxib refill request  

## 2017-04-18 ENCOUNTER — Ambulatory Visit (HOSPITAL_COMMUNITY): Payer: Medicare Other | Admitting: Psychiatry

## 2017-04-20 ENCOUNTER — Ambulatory Visit (HOSPITAL_COMMUNITY): Payer: Self-pay | Admitting: Psychiatry

## 2017-04-20 ENCOUNTER — Telehealth (HOSPITAL_COMMUNITY): Payer: Self-pay | Admitting: *Deleted

## 2017-04-20 NOTE — Telephone Encounter (Signed)
Called pt to resch appt for today due to provider out of office. lmtcb and office number provided on voicemail box.

## 2017-04-21 DIAGNOSIS — I2699 Other pulmonary embolism without acute cor pulmonale: Secondary | ICD-10-CM | POA: Diagnosis not present

## 2017-04-21 DIAGNOSIS — Z6830 Body mass index (BMI) 30.0-30.9, adult: Secondary | ICD-10-CM | POA: Diagnosis not present

## 2017-04-21 DIAGNOSIS — Z299 Encounter for prophylactic measures, unspecified: Secondary | ICD-10-CM | POA: Diagnosis not present

## 2017-04-21 DIAGNOSIS — I10 Essential (primary) hypertension: Secondary | ICD-10-CM | POA: Diagnosis not present

## 2017-04-21 DIAGNOSIS — F329 Major depressive disorder, single episode, unspecified: Secondary | ICD-10-CM | POA: Diagnosis not present

## 2017-04-27 ENCOUNTER — Encounter (INDEPENDENT_AMBULATORY_CARE_PROVIDER_SITE_OTHER): Payer: Self-pay | Admitting: Orthopedic Surgery

## 2017-04-27 ENCOUNTER — Ambulatory Visit (INDEPENDENT_AMBULATORY_CARE_PROVIDER_SITE_OTHER): Payer: Medicare Other

## 2017-04-27 ENCOUNTER — Ambulatory Visit (INDEPENDENT_AMBULATORY_CARE_PROVIDER_SITE_OTHER): Payer: Medicare Other | Admitting: Orthopedic Surgery

## 2017-04-27 VITALS — BP 113/69 | HR 84 | Resp 12 | Ht 65.0 in | Wt 220.0 lb

## 2017-04-27 DIAGNOSIS — G8929 Other chronic pain: Secondary | ICD-10-CM

## 2017-04-27 DIAGNOSIS — M25562 Pain in left knee: Secondary | ICD-10-CM | POA: Diagnosis not present

## 2017-04-27 DIAGNOSIS — M1712 Unilateral primary osteoarthritis, left knee: Secondary | ICD-10-CM

## 2017-04-27 MED ORDER — METHYLPREDNISOLONE ACETATE 40 MG/ML IJ SUSP
80.0000 mg | INTRAMUSCULAR | Status: AC | PRN
  Administered 2017-04-27: 80 mg

## 2017-04-27 MED ORDER — BUPIVACAINE HCL 0.5 % IJ SOLN
3.0000 mL | INTRAMUSCULAR | Status: AC | PRN
  Administered 2017-04-27: 3 mL via INTRA_ARTICULAR

## 2017-04-27 NOTE — Progress Notes (Signed)
Office Visit Note   Patient: Grace Howard           Date of Birth: [birth year]           MRN: [id number] Visit Date: 04/27/2017              Requested by: Ignatius Specking, MD [address] [place], [place] [address] PCP: Ignatius Specking, MD   Assessment & Plan: Visit Diagnoses:  1. Chronic pain of left knee   2. Unilateral primary osteoarthritis, left knee     Plan:  #1: Corticosteroid injection to the left knee was tolerated. #2: Follow back up with [place] on when necessary basis.  Follow-Up Instructions: Return if symptoms worsen or fail to improve.   Orders:  Orders Placed This Encounter  Procedures  . XR KNEE 3 VIEW LEFT   No orders of the defined types were placed in this encounter.     Procedures: Large Joint Inj Date/Time: 04/27/2017 6:28 PM Performed by: Jacqualine Code D Authorized by: Jacqualine Code D   Consent Given by:  Patient Timeout: prior to procedure the correct patient, procedure, and site was verified   Indications:  Pain and joint swelling Location:  Knee Site:  L knee Prep: patient was prepped and draped in usual sterile fashion   Needle Size:  25 G Needle Length:  1.5 inches Approach:  Anteromedial Ultrasound Guidance: No   Fluoroscopic Guidance: No   Arthrogram: No   Medications:  80 mg methylPREDNISolone acetate 40 MG/ML; 3 mL bupivacaine 0.5 % Aspiration Attempted: No   Patient tolerance:  Patient tolerated the procedure well with no immediate complications     Clinical Data: No additional findings.   Subjective: Chief Complaint  Patient presents with  . Left Knee - Pain    Grace Howard is a 81 y o that presents with chronic Left knee pain. She relates she fell [place] the beach, landed on buttocks and twisted the L knee. Last injection 01/01/17. Pt pre-diabetic, never noticed any side effects of cortisone injections.    Grace Howard is a 67 -year-old female that presents with chronic Left knee pain. She relates though that she fell  while [place]. [place] and [place] on her buttocks and twisted the left knee. Last injection 01/01/17. She did well until this most recent problem. Denies any giving way symptoms at this time.    Review of Systems  Constitutional: Negative for chills, fatigue and fever.  Eyes: Negative for itching.  Respiratory: Positive for chest tightness. Negative for shortness of breath.   Cardiovascular: Negative for chest pain, palpitations and leg swelling.  Gastrointestinal: Positive for constipation. Negative for blood in stool and diarrhea.  Musculoskeletal: Positive for back pain and gait problem. Negative for joint swelling, neck pain and neck stiffness.  Neurological: Negative for dizziness, weakness, numbness and headaches.  Hematological: Does not bruise/bleed easily.  Psychiatric/Behavioral: Positive for sleep disturbance. The patient is nervous/anxious.      Objective: Vital Signs: BP 113/69   Pulse 84   Resp 12   Ht 5\' 5"  (1.651 m)   Wt 220 lb (99.8 kg)   BMI 36.61 kg/m   Physical Exam  Constitutional: She is oriented to person, place, and time. She appears well-developed and well-nourished.  HENT:  Head: Normocephalic and atraumatic.  Eyes: Pupils are equal, round, and reactive to light. EOM are normal.  Pulmonary/Chest: Effort normal.  Neurological: She is  alert and oriented to person, place, and time.  Skin: Skin is warm and dry.  Psychiatric: She has a normal mood and affect. Her behavior is normal. Judgment and thought content normal.    Ortho Exam  Exam today reveals a left knee with range of motion 0-100. She is tender at the lateral joint line to palpation. Could not tell the 30 she has an effusion secondary to size of the knee. Skin is intact without ecchymosis nervous intact distally  Specialty Comments:  No specialty comments available.  Imaging: No results found.   PMFS History: Patient Active Problem List    Diagnosis Date Noted  . Insomnia secondary to depression with anxiety 12/06/2012  . Chronic pain syndrome 10/16/2012  . Unspecified vitamin D deficiency 10/16/2012  . MDD (major depressive disorder) 02/15/2012   Past Medical History:  Diagnosis Date  . Anxiety   . Depression   . GERD (gastroesophageal reflux disease)   . Headache(784.0)   . HTN (hypertension)   . Hyperlipidemia   . Insomnia   . Lupus   . Macular degeneration June 2013  . Osteoarthritis June 2013  . Osteoporosis   . PE (pulmonary embolism) 2008   not sure why  . Seizures (HCC)   . Wears glasses     Family History  Problem Relation Age of Onset  . Dementia Father   . ADD / ADHD Other   . ADD / ADHD Other   . Healthy Son   . Healthy Son   . Depression Sister   . Anxiety disorder Sister   . Sexual abuse Sister   . Alcohol abuse Paternal Aunt   . Drug abuse Paternal Uncle   . Bipolar disorder Neg Hx   . OCD Neg Hx   . Paranoid behavior Neg Hx   . Schizophrenia Neg Hx   . Seizures Neg Hx   . Physical abuse Neg Hx     Past Surgical History:  Procedure Laterality Date  . abdomnial surgery    . ANKLE FRACTURE SURGERY  2000   right  . APPENDECTOMY    . CARDIAC CATHETERIZATION  2008   PE-filter in  . COLONOSCOPY    . DILITATION & CURRETTAGE/HYSTROSCOPY WITH ESSURE    . EYE SURGERY     both cataracts  . HARDWARE REMOVAL Left 02/26/2014   Procedure: REMOVAL HARDWARE ;  Surgeon: Tami Ribas, MD;  Location: [facility];  Service: Orthopedics;  Laterality: Left;  . TONSILLECTOMY    . WRIST SURGERY  2011   may-lt   Social History   Occupational History  . Not on file.   Social History Main Topics  . Smoking status: Former Smoker    Quit date: 02/19/1978  . Smokeless tobacco: Never Used  . Alcohol use No  . Drug use: No  . Sexual activity: No

## 2017-04-28 ENCOUNTER — Ambulatory Visit (INDEPENDENT_AMBULATORY_CARE_PROVIDER_SITE_OTHER): Payer: Medicare Other | Admitting: Psychiatry

## 2017-04-28 ENCOUNTER — Encounter (HOSPITAL_COMMUNITY): Payer: Self-pay | Admitting: Psychiatry

## 2017-04-28 VITALS — BP 140/80 | HR 84 | Ht 65.0 in | Wt 202.0 lb

## 2017-04-28 DIAGNOSIS — E669 Obesity, unspecified: Secondary | ICD-10-CM | POA: Diagnosis not present

## 2017-04-28 DIAGNOSIS — F419 Anxiety disorder, unspecified: Secondary | ICD-10-CM

## 2017-04-28 DIAGNOSIS — Z811 Family history of alcohol abuse and dependence: Secondary | ICD-10-CM | POA: Diagnosis not present

## 2017-04-28 DIAGNOSIS — F331 Major depressive disorder, recurrent, moderate: Secondary | ICD-10-CM

## 2017-04-28 DIAGNOSIS — Z818 Family history of other mental and behavioral disorders: Secondary | ICD-10-CM

## 2017-04-28 DIAGNOSIS — Z6833 Body mass index (BMI) 33.0-33.9, adult: Secondary | ICD-10-CM | POA: Diagnosis not present

## 2017-04-28 DIAGNOSIS — M1990 Unspecified osteoarthritis, unspecified site: Secondary | ICD-10-CM

## 2017-04-28 DIAGNOSIS — M329 Systemic lupus erythematosus, unspecified: Secondary | ICD-10-CM | POA: Diagnosis not present

## 2017-04-28 DIAGNOSIS — Z79899 Other long term (current) drug therapy: Secondary | ICD-10-CM

## 2017-04-28 DIAGNOSIS — I10 Essential (primary) hypertension: Secondary | ICD-10-CM | POA: Diagnosis not present

## 2017-04-28 DIAGNOSIS — F329 Major depressive disorder, single episode, unspecified: Secondary | ICD-10-CM

## 2017-04-28 DIAGNOSIS — E785 Hyperlipidemia, unspecified: Secondary | ICD-10-CM

## 2017-04-28 DIAGNOSIS — I2510 Atherosclerotic heart disease of native coronary artery without angina pectoris: Secondary | ICD-10-CM

## 2017-04-28 DIAGNOSIS — Z813 Family history of other psychoactive substance abuse and dependence: Secondary | ICD-10-CM

## 2017-04-28 MED ORDER — DULOXETINE HCL 60 MG PO CPEP: 60.0000 mg | ORAL_CAPSULE | Freq: Two times a day (BID) | ORAL | 3 refills | Status: DC

## 2017-04-28 MED ORDER — DIAZEPAM 2 MG PO TABS: 2.0000 mg | ORAL_TABLET | Freq: Three times a day (TID) | ORAL | 3 refills | Status: DC | PRN

## 2017-04-28 MED ORDER — ARIPIPRAZOLE 10 MG PO TABS: 15.0000 mg | ORAL_TABLET | Freq: Every day | ORAL | 3 refills | Status: DC
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
## 2017-05-24 ENCOUNTER — Ambulatory Visit: Payer: Self-pay | Admitting: Cardiology

## 2017-05-24 DIAGNOSIS — I10 Essential (primary) hypertension: Secondary | ICD-10-CM | POA: Diagnosis not present

## 2017-05-24 DIAGNOSIS — I2510 Atherosclerotic heart disease of native coronary artery without angina pectoris: Secondary | ICD-10-CM | POA: Diagnosis not present

## 2017-05-24 DIAGNOSIS — I4891 Unspecified atrial fibrillation: Secondary | ICD-10-CM | POA: Diagnosis not present

## 2017-05-24 DIAGNOSIS — M159 Polyosteoarthritis, unspecified: Secondary | ICD-10-CM | POA: Diagnosis not present

## 2017-05-26 DIAGNOSIS — E7800 Pure hypercholesterolemia, unspecified: Secondary | ICD-10-CM | POA: Diagnosis not present

## 2017-05-26 DIAGNOSIS — I4891 Unspecified atrial fibrillation: Secondary | ICD-10-CM | POA: Diagnosis not present

## 2017-05-26 DIAGNOSIS — Z713 Dietary counseling and surveillance: Secondary | ICD-10-CM | POA: Diagnosis not present

## 2017-05-26 DIAGNOSIS — E1165 Type 2 diabetes mellitus with hyperglycemia: Secondary | ICD-10-CM | POA: Diagnosis not present

## 2017-05-26 DIAGNOSIS — R569 Unspecified convulsions: Secondary | ICD-10-CM | POA: Diagnosis not present

## 2017-05-26 DIAGNOSIS — I2699 Other pulmonary embolism without acute cor pulmonale: Secondary | ICD-10-CM | POA: Diagnosis not present

## 2017-05-26 DIAGNOSIS — E785 Hyperlipidemia, unspecified: Secondary | ICD-10-CM | POA: Diagnosis not present

## 2017-05-26 DIAGNOSIS — Z299 Encounter for prophylactic measures, unspecified: Secondary | ICD-10-CM | POA: Diagnosis not present

## 2017-05-26 DIAGNOSIS — I10 Essential (primary) hypertension: Secondary | ICD-10-CM | POA: Diagnosis not present

## 2017-05-26 DIAGNOSIS — Z6830 Body mass index (BMI) 30.0-30.9, adult: Secondary | ICD-10-CM | POA: Diagnosis not present

## 2017-05-30 ENCOUNTER — Ambulatory Visit (INDEPENDENT_AMBULATORY_CARE_PROVIDER_SITE_OTHER): Payer: Medicare Other | Admitting: Cardiology

## 2017-05-30 ENCOUNTER — Encounter: Payer: Self-pay | Admitting: Cardiology

## 2017-05-30 VITALS — BP 134/70 | HR 81 | Ht 65.5 in | Wt 205.0 lb

## 2017-05-30 DIAGNOSIS — I2510 Atherosclerotic heart disease of native coronary artery without angina pectoris: Secondary | ICD-10-CM

## 2017-05-30 DIAGNOSIS — R0602 Shortness of breath: Secondary | ICD-10-CM | POA: Diagnosis not present

## 2017-05-30 NOTE — Progress Notes (Signed)
Clinical Summary Grace Howard is a 81 y.o.female seen today for follow up of the following medical problems.    1. CAD - cath 2008 showed borderline CAD per pcp notes, only a D2 50% lesion is mentioned. Do not have cath report at this time.  - SOB started about 1 year ago. Noted when working with PT. Only occurs with activity. Can occur walking room to room at home - no chest pain. No recent edema. No coughing or wheezing. Former smoker x 20 years. Reports PFTs long time ago - has had some recent SOB   11/2016 echo at [facility]: LVEF 60-65%, abnormal diastolic function - 01/2017 nuclear stress: no ischemia  - no recent chest pain. Stable SOB.  - sedentary lifestyle, limited by leg pains.   2. History of PE - diagnosed in 2008, has been on coumadin since that time  3. SLE  4. SOB - echo 11/2016 at [facility]: LVEF 60-65%, grade I diastolic dysfunction, normal IVC - recent stress test without ischemia -no clear cardiac cause for her symptoms   Past Medical History:  Diagnosis Date  . Anxiety   . Depression   . GERD (gastroesophageal reflux disease)   . Headache(784.0)   . HTN (hypertension)   . Hyperlipidemia   . Insomnia   . Lupus   . Macular degeneration June 2013  . Osteoarthritis June 2013  . Osteoporosis   . PE (pulmonary embolism) 2008   not sure why  . Seizures (HCC)   . Wears glasses      Allergies  Allergen Reactions  . Tegretol [Carbamazepine] Other (See Comments)    Thought to have caused her Lupus     Current Outpatient Prescriptions  Medication Sig Dispense Refill  . amLODipine (NORVASC) 5 MG tablet Take 5 mg by mouth daily.    . ARIPiprazole (ABILIFY) 10 MG tablet Take 1.5 tablets (15 mg total) by mouth daily. 45 tablet 3  . benazepril (LOTENSIN) 20 MG tablet     . Calcium Carbonate-Vitamin D (CALCIUM + D PO) Take by mouth 2 (two) times daily.    . celecoxib (CELEBREX) 100 MG capsule TAKE ONE CAPSULE BY MOUTH TWICE DAILY  60 capsule 6  . diazepam (VALIUM) 2 MG tablet Take 1 tablet (2 mg total) by mouth 3 (three) times daily as needed for anxiety. 90 tablet 3  . diclofenac sodium (VOLTAREN) 1 % GEL APPLY 3GM TWICE DAILY  2  . DULoxetine (CYMBALTA) 60 MG capsule Take 1 capsule (60 mg total) by mouth 2 (two) times daily. 60 capsule 3  . fluticasone (FLONASE) 50 MCG/ACT nasal spray Place 2 sprays into both nostrils daily.    . hydroxychloroquine (PLAQUENIL) 200 MG tablet Take 300 mg by mouth daily.     Marland Kitchen levETIRAcetam (KEPPRA) 500 MG tablet Take 500 mg by mouth daily.     . metoprolol tartrate (LOPRESSOR) 25 MG tablet Take 25 mg by mouth 2 (two) times daily.    . Multiple Vitamin (MULTIVITAMIN) capsule Take 1 capsule by mouth daily.    . Multiple Vitamins-Minerals (HM COMPLETE 50+ MENS ULTIMATE PO) TAKE ONE TABLET BY MOUTH DAILY  12  . Multiple Vitamins-Minerals (PRESERVISION AREDS) TABS Take 1 tablet by mouth daily.  12  . omega-3 acid ethyl esters (LOVAZA) 1 G capsule Take 2 g by mouth daily.    . pantoprazole (PROTONIX) 40 MG tablet Take 40 mg by mouth daily.    . pravastatin (PRAVACHOL) 80 MG tablet  Take 80 mg by mouth daily.    Marland Kitchen tiZANidine (ZANAFLEX) 2 MG tablet Take 2 mg by mouth 2 (two) times daily as needed for muscle spasms.    . traMADol (ULTRAM) 50 MG tablet     . warfarin (COUMADIN) 2 MG tablet Take by mouth as directed.      No current facility-administered medications for this visit.      Past Surgical History:  Procedure Laterality Date  . abdomnial surgery    . ANKLE FRACTURE SURGERY  2000   right  . APPENDECTOMY    . CARDIAC CATHETERIZATION  2008   PE-filter in  . COLONOSCOPY    . DILITATION & CURRETTAGE/HYSTROSCOPY WITH ESSURE    . EYE SURGERY     both cataracts  . HARDWARE REMOVAL Left 02/26/2014   Procedure: REMOVAL HARDWARE ;  Surgeon: Tami Ribas, MD;  Location: [facility];  Service: Orthopedics;  Laterality: Left;  . TONSILLECTOMY    . WRIST SURGERY  2011    may-lt     Allergies  Allergen Reactions  . Tegretol [Carbamazepine] Other (See Comments)    Thought to have caused her Lupus      Family History  Problem Relation Age of Onset  . Dementia Father   . ADD / ADHD Other   . ADD / ADHD Other   . Healthy Son   . Healthy Son   . Depression Sister   . Anxiety disorder Sister   . Sexual abuse Sister   . Alcohol abuse Paternal Aunt   . Drug abuse Paternal Uncle   . Bipolar disorder Neg Hx   . OCD Neg Hx   . Paranoid behavior Neg Hx   . Schizophrenia Neg Hx   . Seizures Neg Hx   . Physical abuse Neg Hx      Social History Grace Howard reports that she quit smoking about 39 years ago. She has never used smokeless tobacco. Grace Howard reports that she does not drink alcohol.   Review of Systems CONSTITUTIONAL: No weight loss, fever, chills, weakness or fatigue.  HEENT: Eyes: No visual loss, blurred vision, double vision or yellow sclerae.No hearing loss, sneezing, congestion, runny nose or sore throat.  SKIN: No rash or itching.  CARDIOVASCULAR: per hpi RESPIRATORY:per hpi GASTROINTESTINAL: No anorexia, nausea, vomiting or diarrhea. No abdominal pain or blood.  GENITOURINARY: No burning on urination, no polyuria NEUROLOGICAL: No headache, dizziness, syncope, paralysis, ataxia, numbness or tingling in the extremities. No change in bowel or bladder control.  MUSCULOSKELETAL: No muscle, back pain, joint pain or stiffness.  LYMPHATICS: No enlarged nodes. No history of splenectomy.  PSYCHIATRIC: No history of depression or anxiety.  ENDOCRINOLOGIC: No reports of sweating, cold or heat intolerance. No polyuria or polydipsia.  Marland Kitchen   Physical Examination Vitals:   05/30/17 1422  BP: 134/70  Pulse: 81  SpO2: 96%   Vitals:   05/30/17 1422  Weight: 205 lb (93 kg)  Height: 5' 5.5" (1.664 m)    Gen: resting comfortably, no acute distress HEENT: no scleral icterus, pupils equal round and reactive, no palptable cervical adenopathy,    CV: RRR, no m/r/g, no jvd Resp: Clear to auscultation bilaterally GI: abdomen is soft, non-tender, non-distended, normal bowel sounds, no hepatosplenomegaly MSK: extremities are warm, no edema.  Skin: warm, no rash Neuro:  no focal deficits Psych: appropriate affect   Diagnostic Studies  01/2017 Nuclear stress  There was no ST segment deviation noted during stress.  The study  is normal.  This is a low risk study.  Nuclear stress EF: 61%.   Assessment and Plan  1. CAD - recent stress test without ischemia, echo with normal LVEF - continue current meds  2. SOB - negative cardiac testing including stress test and echo. She does have history of tobacco use, will order PFTs    F/u pending test results    Antoine Poche, M.D.

## 2017-05-30 NOTE — Patient Instructions (Signed)
Medication Instructions:  Your physician recommends that you continue on your current medications as directed. Please refer to the Current Medication list given to you today.   Labwork: none  Testing/Procedures: Your physician has recommended that you have a pulmonary function test. Pulmonary Function Tests are a group of tests that measure how well air moves in and out of your lungs.    Follow-Up: Your physician recommends that you schedule a follow-up appointment in: to be determined based on test    Any Other Special Instructions Will Be Listed Below (If Applicable).     If you need a refill on your cardiac medications before your next appointment, please call your pharmacy.

## 2017-06-07 DIAGNOSIS — I4891 Unspecified atrial fibrillation: Secondary | ICD-10-CM | POA: Diagnosis not present

## 2017-06-07 DIAGNOSIS — I10 Essential (primary) hypertension: Secondary | ICD-10-CM | POA: Diagnosis not present

## 2017-06-07 DIAGNOSIS — M159 Polyosteoarthritis, unspecified: Secondary | ICD-10-CM | POA: Diagnosis not present

## 2017-06-07 DIAGNOSIS — I2510 Atherosclerotic heart disease of native coronary artery without angina pectoris: Secondary | ICD-10-CM | POA: Diagnosis not present

## 2017-06-14 DIAGNOSIS — Z23 Encounter for immunization: Secondary | ICD-10-CM | POA: Diagnosis not present

## 2017-06-17 ENCOUNTER — Ambulatory Visit (HOSPITAL_COMMUNITY)
Admission: RE | Admit: 2017-06-17 | Discharge: 2017-06-17 | Disposition: A | Discharge status: Home / Self Care | Payer: Medicare Other | Source: Ambulatory Visit | Attending: Cardiology | Admitting: Cardiology

## 2017-06-17 DIAGNOSIS — R942 Abnormal results of pulmonary function studies: Secondary | ICD-10-CM | POA: Insufficient documentation

## 2017-06-17 DIAGNOSIS — R0609 Other forms of dyspnea: Secondary | ICD-10-CM | POA: Insufficient documentation

## 2017-06-17 DIAGNOSIS — Z87891 Personal history of nicotine dependence: Secondary | ICD-10-CM | POA: Diagnosis not present

## 2017-06-17 DIAGNOSIS — R0602 Shortness of breath: Secondary | ICD-10-CM | POA: Diagnosis present

## 2017-06-17 LAB — PULMONARY FUNCTION TEST
DL/VA % PRED: 61 %
DL/VA: 3.09 ml/min/mmHg/L
DLCO COR % PRED: 44 %
DLCO COR: 12.05 ml/min/mmHg
DLCO unc % pred: 44 %
DLCO unc: 12.05 ml/min/mmHg
FEF 25-75 Post: 1.66 L/sec
FEF 25-75 Pre: 1.36 L/sec
FEF2575-%CHANGE-POST: 22 %
FEF2575-%Pred-Post: 105 %
FEF2575-%Pred-Pre: 86 %
FEV1-%CHANGE-POST: 5 %
FEV1-%PRED-PRE: 79 %
FEV1-%Pred-Post: 83 %
FEV1-Post: 1.81 L
FEV1-Pre: 1.72 L
FEV1FVC-%CHANGE-POST: 2 %
FEV1FVC-%Pred-Pre: 102 %
FEV6-%Change-Post: 2 %
FEV6-%Pred-Post: 85 %
FEV6-%Pred-Pre: 83 %
FEV6-PRE: 2.27 L
FEV6-Post: 2.34 L
FEV6FVC-%PRED-PRE: 105 %
FEV6FVC-%Pred-Post: 105 %
FVC-%Change-Post: 2 %
FVC-%PRED-POST: 80 %
FVC-%Pred-Pre: 78 %
FVC-Post: 2.34 L
FVC-Pre: 2.27 L
POST FEV1/FVC RATIO: 77 %
PRE FEV6/FVC RATIO: 100 %
Post FEV6/FVC ratio: 100 %
Pre FEV1/FVC ratio: 76 %
RV % pred: 91 %
RV: 2.27 L
TLC % pred: 83 %
TLC: 4.45 L

## 2017-06-17 MED ORDER — ALBUTEROL SULFATE (2.5 MG/3ML) 0.083% IN NEBU
2.5000 mg | INHALATION_SOLUTION | Freq: Once | RESPIRATORY_TRACT | Status: AC
  Administered 2017-06-17: 2.5 mg via RESPIRATORY_TRACT

## 2017-06-21 ENCOUNTER — Telehealth: Payer: Self-pay | Admitting: *Deleted

## 2017-06-21 ENCOUNTER — Telehealth: Payer: Self-pay | Admitting: Cardiology

## 2017-06-21 DIAGNOSIS — R942 Abnormal results of pulmonary function studies: Secondary | ICD-10-CM

## 2017-06-21 NOTE — Telephone Encounter (Signed)
Please see result note   J Branch MD 

## 2017-06-21 NOTE — Telephone Encounter (Signed)
Pt aware and voiced understanding. Orders placed for referral and routed to pcp

## 2017-06-21 NOTE — Telephone Encounter (Signed)
Pt aware and appreciative of call.

## 2017-06-21 NOTE — Telephone Encounter (Signed)
Pt says she is getting married 11/3 and wanted to know if she should postpone wedding since had abnormal PFTs - suggested she should get Dr Juanetta Gosling recs when she sees him. Pt wants Dr Cherie Dark since referral was just sent today

## 2017-06-21 NOTE — Telephone Encounter (Signed)
Patient called back with concerns of whether or not she needs to postpone her wedding due to results of her breathing test

## 2017-06-21 NOTE — Telephone Encounter (Signed)
Pt had PFT done 10/12 - requesting results and when/if she needs f/u with Dr Wyline Mood. Pt aware that Dr Wyline Mood has not reviewed results but would forward message and call her back.

## 2017-06-21 NOTE — Telephone Encounter (Signed)
-----   Message from Antoine Poche, MD sent at 06/21/2017 12:52 PM EDT ----- Abnormal PFTs, please refer to Dr Juanetta Gosling. F/u with [place] in 4 months  Dominga Ferry MD

## 2017-06-21 NOTE — Telephone Encounter (Signed)
No need to postpone her weeding at all. She can proceed with her daily activities without limitation, and we will await hear appointment with Dr Juanetta Gosling. There is nothing from a lung standpoint that is dangerous or means she needs to limit herself   J BrancH MD

## 2017-06-22 ENCOUNTER — Ambulatory Visit (HOSPITAL_COMMUNITY): Payer: Self-pay | Admitting: Psychiatry

## 2017-06-23 DIAGNOSIS — Z1339 Encounter for screening examination for other mental health and behavioral disorders: Secondary | ICD-10-CM | POA: Diagnosis not present

## 2017-06-23 DIAGNOSIS — Z6830 Body mass index (BMI) 30.0-30.9, adult: Secondary | ICD-10-CM | POA: Diagnosis not present

## 2017-06-23 DIAGNOSIS — R5383 Other fatigue: Secondary | ICD-10-CM | POA: Diagnosis not present

## 2017-06-23 DIAGNOSIS — I2699 Other pulmonary embolism without acute cor pulmonale: Secondary | ICD-10-CM | POA: Diagnosis not present

## 2017-06-23 DIAGNOSIS — Z0000 Encounter for general adult medical examination without abnormal findings: Secondary | ICD-10-CM | POA: Diagnosis not present

## 2017-06-23 DIAGNOSIS — Z299 Encounter for prophylactic measures, unspecified: Secondary | ICD-10-CM | POA: Diagnosis not present

## 2017-06-23 DIAGNOSIS — Z7189 Other specified counseling: Secondary | ICD-10-CM | POA: Diagnosis not present

## 2017-06-23 DIAGNOSIS — M545 Low back pain: Secondary | ICD-10-CM | POA: Diagnosis not present

## 2017-06-23 DIAGNOSIS — I4891 Unspecified atrial fibrillation: Secondary | ICD-10-CM | POA: Diagnosis not present

## 2017-06-23 DIAGNOSIS — E785 Hyperlipidemia, unspecified: Secondary | ICD-10-CM | POA: Diagnosis not present

## 2017-06-23 DIAGNOSIS — I10 Essential (primary) hypertension: Secondary | ICD-10-CM | POA: Diagnosis not present

## 2017-06-23 DIAGNOSIS — Z1331 Encounter for screening for depression: Secondary | ICD-10-CM | POA: Diagnosis not present

## 2017-06-27 DIAGNOSIS — E785 Hyperlipidemia, unspecified: Secondary | ICD-10-CM | POA: Diagnosis not present

## 2017-06-27 DIAGNOSIS — R5383 Other fatigue: Secondary | ICD-10-CM | POA: Diagnosis not present

## 2017-06-27 DIAGNOSIS — Z79899 Other long term (current) drug therapy: Secondary | ICD-10-CM | POA: Diagnosis not present

## 2017-07-05 ENCOUNTER — Telehealth (HOSPITAL_COMMUNITY): Payer: Self-pay

## 2017-07-05 ENCOUNTER — Ambulatory Visit (HOSPITAL_COMMUNITY): Payer: Self-pay | Admitting: Psychiatry

## 2017-07-05 MED ORDER — DIAZEPAM 2 MG PO TABS: 2.0000 mg | ORAL_TABLET | Freq: Three times a day (TID) | ORAL | 0 refills | Status: DC | PRN

## 2017-07-05 NOTE — Telephone Encounter (Signed)
You may call in a month supply, they do not have to pick it up

## 2017-07-05 NOTE — Telephone Encounter (Signed)
Medication management - Telephone call with pt to inform Dr. Tenny Craw approved a one time refill of her Valium prescription to be called into her pharmacy. She requested this be called into [facility] and will call back on tomorrow to reschedule a new appointment.  New one time refill order of patient's Valium 2 mg, one three times daily as needed for anxiety, #90 with no refills called into [facility] with Consuello Closs, pharmacist this date. Marland Kitchen

## 2017-07-05 NOTE — Telephone Encounter (Signed)
Medication refill request - Patient called to report she is vomiting today and not feeling well and will need to reschedule appt for 3pm. Requests a refill of Valium and for her fiancee to be able to pick up a new order today.  Agreed to send request to Dr. Tenny Craw as patient would like the order today if Dr. Tenny Craw approves.  Patient requests a call back to inform of order approved and to reschedule canceled appointment.

## 2017-07-06 NOTE — Telephone Encounter (Signed)
Staff called pt twice and phone would pick up and no one would respond with staff said hello and the phone would hang up.

## 2017-07-14 DIAGNOSIS — M159 Polyosteoarthritis, unspecified: Secondary | ICD-10-CM | POA: Diagnosis not present

## 2017-07-14 DIAGNOSIS — I2510 Atherosclerotic heart disease of native coronary artery without angina pectoris: Secondary | ICD-10-CM | POA: Diagnosis not present

## 2017-07-14 DIAGNOSIS — I4891 Unspecified atrial fibrillation: Secondary | ICD-10-CM | POA: Diagnosis not present

## 2017-07-14 DIAGNOSIS — I10 Essential (primary) hypertension: Secondary | ICD-10-CM | POA: Diagnosis not present

## 2017-07-18 ENCOUNTER — Telehealth: Payer: Self-pay | Admitting: Cardiology

## 2017-07-18 NOTE — Telephone Encounter (Signed)
Patient had a test done and said the tech told her it was abnormal.  She has not heard from the doctor and would like to know if we can let her know what they results are

## 2017-07-18 NOTE — Telephone Encounter (Signed)
LM to return call.

## 2017-07-18 NOTE — Telephone Encounter (Signed)
Pt was scheduled with Dr Juanetta Gosling for 11/5 per scheduling notes pt was made aware of this appt. Pt says she doesn't remember anyone calling but would call Dr Juanetta Gosling to reschedule this appt. Provided pt with Dr Juanetta Gosling phone #

## 2017-07-20 ENCOUNTER — Encounter (HOSPITAL_COMMUNITY): Payer: Self-pay | Admitting: Psychiatry

## 2017-07-20 ENCOUNTER — Ambulatory Visit (INDEPENDENT_AMBULATORY_CARE_PROVIDER_SITE_OTHER): Payer: Medicare Other | Admitting: Psychiatry

## 2017-07-20 VITALS — BP 124/76 | HR 83 | Ht 65.5 in | Wt 203.0 lb

## 2017-07-20 DIAGNOSIS — F331 Major depressive disorder, recurrent, moderate: Secondary | ICD-10-CM

## 2017-07-20 DIAGNOSIS — Z813 Family history of other psychoactive substance abuse and dependence: Secondary | ICD-10-CM | POA: Diagnosis not present

## 2017-07-20 DIAGNOSIS — M549 Dorsalgia, unspecified: Secondary | ICD-10-CM | POA: Diagnosis not present

## 2017-07-20 DIAGNOSIS — M2550 Pain in unspecified joint: Secondary | ICD-10-CM | POA: Diagnosis not present

## 2017-07-20 DIAGNOSIS — F419 Anxiety disorder, unspecified: Secondary | ICD-10-CM | POA: Diagnosis not present

## 2017-07-20 DIAGNOSIS — I2510 Atherosclerotic heart disease of native coronary artery without angina pectoris: Secondary | ICD-10-CM | POA: Diagnosis not present

## 2017-07-20 DIAGNOSIS — Z810 Family history of intellectual disabilities: Secondary | ICD-10-CM

## 2017-07-20 DIAGNOSIS — Z87891 Personal history of nicotine dependence: Secondary | ICD-10-CM | POA: Diagnosis not present

## 2017-07-20 DIAGNOSIS — Z811 Family history of alcohol abuse and dependence: Secondary | ICD-10-CM | POA: Diagnosis not present

## 2017-07-20 DIAGNOSIS — Z818 Family history of other mental and behavioral disorders: Secondary | ICD-10-CM

## 2017-07-20 MED ORDER — DULOXETINE HCL 60 MG PO CPEP: 60.0000 mg | ORAL_CAPSULE | Freq: Two times a day (BID) | ORAL | 3 refills | Status: DC

## 2017-07-20 MED ORDER — DIAZEPAM 2 MG PO TABS: 2.0000 mg | ORAL_TABLET | Freq: Three times a day (TID) | ORAL | 2 refills | Status: DC | PRN

## 2017-07-20 MED ORDER — ARIPIPRAZOLE 10 MG PO TABS: 15.0000 mg | ORAL_TABLET | Freq: Every day | ORAL | 3 refills | Status: DC
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
## 2017-07-26 DIAGNOSIS — Z6831 Body mass index (BMI) 31.0-31.9, adult: Secondary | ICD-10-CM | POA: Diagnosis not present

## 2017-07-26 DIAGNOSIS — I10 Essential (primary) hypertension: Secondary | ICD-10-CM | POA: Diagnosis not present

## 2017-07-26 DIAGNOSIS — Z713 Dietary counseling and surveillance: Secondary | ICD-10-CM | POA: Diagnosis not present

## 2017-07-26 DIAGNOSIS — Z299 Encounter for prophylactic measures, unspecified: Secondary | ICD-10-CM | POA: Diagnosis not present

## 2017-07-26 DIAGNOSIS — I2699 Other pulmonary embolism without acute cor pulmonale: Secondary | ICD-10-CM | POA: Diagnosis not present

## 2017-08-02 ENCOUNTER — Telehealth (HOSPITAL_COMMUNITY): Payer: Self-pay | Admitting: *Deleted

## 2017-08-03 DIAGNOSIS — I5032 Chronic diastolic (congestive) heart failure: Secondary | ICD-10-CM | POA: Diagnosis not present

## 2017-08-03 DIAGNOSIS — I10 Essential (primary) hypertension: Secondary | ICD-10-CM | POA: Diagnosis not present

## 2017-08-03 DIAGNOSIS — Z86711 Personal history of pulmonary embolism: Secondary | ICD-10-CM | POA: Diagnosis not present

## 2017-08-03 NOTE — Telephone Encounter (Signed)
Patient has been notified of approval for Aripiprazole 10 mg tablet. (quanity of 45 per 30 days)  Approved from 08/02/2017 through 08/02/2018

## 2017-08-05 DIAGNOSIS — Z1231 Encounter for screening mammogram for malignant neoplasm of breast: Secondary | ICD-10-CM | POA: Diagnosis not present

## 2017-08-09 ENCOUNTER — Other Ambulatory Visit (HOSPITAL_COMMUNITY): Payer: Self-pay | Admitting: Pulmonary Disease

## 2017-08-09 DIAGNOSIS — R942 Abnormal results of pulmonary function studies: Secondary | ICD-10-CM

## 2017-08-16 ENCOUNTER — Ambulatory Visit (HOSPITAL_COMMUNITY): Payer: Medicare Other

## 2017-08-23 DIAGNOSIS — Z299 Encounter for prophylactic measures, unspecified: Secondary | ICD-10-CM | POA: Diagnosis not present

## 2017-08-23 DIAGNOSIS — I2699 Other pulmonary embolism without acute cor pulmonale: Secondary | ICD-10-CM | POA: Diagnosis not present

## 2017-08-23 DIAGNOSIS — E1165 Type 2 diabetes mellitus with hyperglycemia: Secondary | ICD-10-CM | POA: Diagnosis not present

## 2017-08-23 DIAGNOSIS — I4891 Unspecified atrial fibrillation: Secondary | ICD-10-CM | POA: Diagnosis not present

## 2017-08-23 DIAGNOSIS — Z6831 Body mass index (BMI) 31.0-31.9, adult: Secondary | ICD-10-CM | POA: Diagnosis not present

## 2017-08-23 DIAGNOSIS — E785 Hyperlipidemia, unspecified: Secondary | ICD-10-CM | POA: Diagnosis not present

## 2017-08-23 DIAGNOSIS — M545 Low back pain: Secondary | ICD-10-CM | POA: Diagnosis not present

## 2017-08-23 DIAGNOSIS — I10 Essential (primary) hypertension: Secondary | ICD-10-CM | POA: Diagnosis not present

## 2017-08-24 DIAGNOSIS — I4891 Unspecified atrial fibrillation: Secondary | ICD-10-CM | POA: Diagnosis not present

## 2017-08-24 DIAGNOSIS — M159 Polyosteoarthritis, unspecified: Secondary | ICD-10-CM | POA: Diagnosis not present

## 2017-08-24 DIAGNOSIS — I10 Essential (primary) hypertension: Secondary | ICD-10-CM | POA: Diagnosis not present

## 2017-08-24 DIAGNOSIS — I2510 Atherosclerotic heart disease of native coronary artery without angina pectoris: Secondary | ICD-10-CM | POA: Diagnosis not present

## 2017-08-25 ENCOUNTER — Telehealth (HOSPITAL_COMMUNITY): Payer: Self-pay | Admitting: *Deleted

## 2017-08-25 ENCOUNTER — Ambulatory Visit (HOSPITAL_COMMUNITY)
Admission: RE | Admit: 2017-08-25 | Discharge: 2017-08-25 | Disposition: A | Discharge status: Home / Self Care | Payer: Medicare Other | Source: Ambulatory Visit | Attending: Pulmonary Disease | Admitting: Pulmonary Disease

## 2017-08-25 DIAGNOSIS — E048 Other specified nontoxic goiter: Secondary | ICD-10-CM | POA: Diagnosis not present

## 2017-08-25 DIAGNOSIS — I2510 Atherosclerotic heart disease of native coronary artery without angina pectoris: Secondary | ICD-10-CM | POA: Diagnosis not present

## 2017-08-25 DIAGNOSIS — R0602 Shortness of breath: Secondary | ICD-10-CM | POA: Diagnosis not present

## 2017-08-25 DIAGNOSIS — R942 Abnormal results of pulmonary function studies: Secondary | ICD-10-CM | POA: Diagnosis not present

## 2017-08-25 DIAGNOSIS — K449 Diaphragmatic hernia without obstruction or gangrene: Secondary | ICD-10-CM | POA: Diagnosis not present

## 2017-08-25 DIAGNOSIS — J439 Emphysema, unspecified: Secondary | ICD-10-CM | POA: Diagnosis not present

## 2017-08-25 DIAGNOSIS — I288 Other diseases of pulmonary vessels: Secondary | ICD-10-CM | POA: Insufficient documentation

## 2017-08-25 DIAGNOSIS — I700 Atherosclerosis of aorta: Secondary | ICD-10-CM | POA: Insufficient documentation

## 2017-08-25 DIAGNOSIS — J479 Bronchiectasis, uncomplicated: Secondary | ICD-10-CM | POA: Insufficient documentation

## 2017-08-25 DIAGNOSIS — I7789 Other specified disorders of arteries and arterioles: Secondary | ICD-10-CM | POA: Insufficient documentation

## 2017-08-25 NOTE — Telephone Encounter (Signed)
This was sent in on 11/14 with 3 refills

## 2017-08-25 NOTE — Telephone Encounter (Signed)
LVM concerning Rx for Abilify per dr Tenny Craw : sent in on 11/14 with 3 refills

## 2017-08-25 NOTE — Telephone Encounter (Signed)
Dr Tenny Craw  [name] called requesting refill on her Abilify. And she asked if you would have them cut the 1/2 tablets.

## 2017-09-07 ENCOUNTER — Ambulatory Visit (INDEPENDENT_AMBULATORY_CARE_PROVIDER_SITE_OTHER): Payer: Medicare Other | Admitting: Orthopaedic Surgery

## 2017-09-12 DIAGNOSIS — I10 Essential (primary) hypertension: Secondary | ICD-10-CM | POA: Diagnosis not present

## 2017-09-12 DIAGNOSIS — I2510 Atherosclerotic heart disease of native coronary artery without angina pectoris: Secondary | ICD-10-CM | POA: Diagnosis not present

## 2017-09-12 DIAGNOSIS — I4891 Unspecified atrial fibrillation: Secondary | ICD-10-CM | POA: Diagnosis not present

## 2017-09-12 DIAGNOSIS — M159 Polyosteoarthritis, unspecified: Secondary | ICD-10-CM | POA: Diagnosis not present

## 2017-09-14 ENCOUNTER — Encounter (INDEPENDENT_AMBULATORY_CARE_PROVIDER_SITE_OTHER): Payer: Self-pay | Admitting: Orthopaedic Surgery

## 2017-09-14 ENCOUNTER — Ambulatory Visit (INDEPENDENT_AMBULATORY_CARE_PROVIDER_SITE_OTHER): Payer: Medicare Other | Admitting: Orthopaedic Surgery

## 2017-09-14 VITALS — BP 141/83 | HR 96 | Ht 62.0 in | Wt 203.0 lb

## 2017-09-14 DIAGNOSIS — M25552 Pain in left hip: Secondary | ICD-10-CM | POA: Diagnosis not present

## 2017-09-14 MED ORDER — METHYLPREDNISOLONE ACETATE 40 MG/ML IJ SUSP
80.0000 mg | INTRAMUSCULAR | Status: AC | PRN
  Administered 2017-09-14: 80 mg

## 2017-09-14 MED ORDER — LIDOCAINE HCL 1 % IJ SOLN
2.0000 mL | INTRAMUSCULAR | Status: AC | PRN
  Administered 2017-09-14: 2 mL

## 2017-09-14 MED ORDER — BUPIVACAINE HCL 0.5 % IJ SOLN
2.0000 mL | INTRAMUSCULAR | Status: AC | PRN
  Administered 2017-09-14: 2 mL via INTRA_ARTICULAR

## 2017-09-14 NOTE — Progress Notes (Deleted)
Office Visit Note   Patient: Grace Howard           Date of Birth: 1936/08/09           MRN: [id number] Visit Date: 09/14/2017              Requested by: Ignatius Specking, MD [address] [place], [place] [address] PCP: Ignatius Specking, MD   Assessment & Plan: Visit Diagnoses:  1. Pain of left hip joint     Plan: ***  Follow-Up Instructions: No Follow-up on file.   Orders:  No orders of the defined types were placed in this encounter.  No orders of the defined types were placed in this encounter.     Procedures: No procedures performed   Clinical Data: No additional findings.   Subjective: Chief Complaint  Patient presents with  . Left Knee - Pain  . Right Knee - Pain    HPI  Review of Systems  Constitutional: Negative for chills, fatigue and fever.  Eyes: Negative for itching.  Respiratory: Negative for chest tightness and shortness of breath.   Cardiovascular: Positive for leg swelling. Negative for chest pain and palpitations.  Gastrointestinal: Negative for blood in stool, constipation and diarrhea.  Endocrine: Negative for polyuria.  Genitourinary: Negative for dysuria.  Musculoskeletal: Positive for arthralgias. Negative for back pain, joint swelling, neck pain and neck stiffness.  Allergic/Immunologic: Negative for immunocompromised state.  Neurological: Negative for dizziness and numbness.  Hematological: Does not bruise/bleed easily.  Psychiatric/Behavioral: The patient is not nervous/anxious.      Objective: Vital Signs: There were no vitals taken for this visit.  Physical Exam  Ortho Exam  Specialty Comments:  No specialty comments available.  Imaging: No results found.   PMFS History: Patient Active Problem List   Diagnosis Date Noted  . Insomnia secondary to depression with anxiety 12/06/2012  . Chronic pain syndrome 10/16/2012  . Unspecified vitamin D deficiency 10/16/2012  . MDD (major depressive disorder) 02/15/2012   Past  Medical History:  Diagnosis Date  . Anxiety   . Depression   . GERD (gastroesophageal reflux disease)   . Headache(784.0)   . HTN (hypertension)   . Hyperlipidemia   . Insomnia   . Lupus   . Macular degeneration June 2013  . Osteoarthritis June 2013  . Osteoporosis   . PE (pulmonary embolism) 2008   not sure why  . Seizures (HCC)   . Wears glasses     Family History  Problem Relation Age of Onset  . Dementia Father   . ADD / ADHD Other   . ADD / ADHD Other   . Healthy Son   . Healthy Son   . Depression Sister   . Anxiety disorder Sister   . Sexual abuse Sister   . Alcohol abuse Paternal Aunt   . Drug abuse Paternal Uncle   . Bipolar disorder Neg Hx   . OCD Neg Hx   . Paranoid behavior Neg Hx   . Schizophrenia Neg Hx   . Seizures Neg Hx   . Physical abuse Neg Hx     Past Surgical History:  Procedure Laterality Date  . abdomnial surgery    . ANKLE FRACTURE SURGERY  2000   right  . APPENDECTOMY    . CARDIAC CATHETERIZATION  2008   PE-filter in  . COLONOSCOPY    . DILITATION & CURRETTAGE/HYSTROSCOPY WITH ESSURE    . EYE SURGERY     both cataracts  .  HARDWARE REMOVAL Left 02/26/2014   Procedure: REMOVAL HARDWARE ;  Surgeon: Tami Ribas, MD;  Location: [facility];  Service: Orthopedics;  Laterality: Left;  . TONSILLECTOMY    . WRIST SURGERY  2011   may-lt   Social History   Occupational History  . Not on file  Tobacco Use  . Smoking status: Former Smoker    Last attempt to quit: 02/19/1978    Years since quitting: 39.5  . Smokeless tobacco: Never Used  Substance and Sexual Activity  . Alcohol use: No  . Drug use: No  . Sexual activity: No

## 2017-09-14 NOTE — Progress Notes (Signed)
Office Visit Note   Patient: Grace Howard           Date of Birth: [birth year]           MRN: [id number] Visit Date: 09/14/2017              Requested by: Ignatius Specking, MD [address] [place], [place] [address] PCP: Ignatius Specking, MD   Assessment & Plan: Visit Diagnoses:  1. Pain of left hip joint     Plan: Recent onset of pain along the lateral aspect of the left hip without injury or trauma. Symptoms are consistent with trochanteric bursitis. We'll plan on injecting that today and monitor her response  Follow-Up Instructions: Return if symptoms worsen or fail to improve.   Orders:  No orders of the defined types were placed in this encounter.  No orders of the defined types were placed in this encounter.     Procedures: Large Joint Inj: L greater trochanter on 09/14/2017 1:38 PM Indications: pain and diagnostic evaluation Details: 25 G 1.5 in needle, lateral approach  Arthrogram: No  Medications: 2 mL lidocaine 1 %; 2 mL bupivacaine 0.5 %; 80 mg methylPREDNISolone acetate 40 MG/ML Procedure, treatment alternatives, risks and benefits explained, specific risks discussed. Consent was given by the patient. Immediately prior to procedure a time out was called to verify the correct patient, procedure, equipment, support staff and site/side marked as required. Patient was prepped and draped in the usual sterile fashion.       Clinical Data: No additional findings.   Subjective: Chief Complaint  Patient presents with  . Left Knee - Pain  . Right Knee - Pain  History of bilateral knee osteoarthritis. Presently having difficulty with pain along the lateral aspect of the left hip. Recent onset without injury or trauma. Pain is fairly well localized to the lateral trochanteric region. Prior films without evidence of hip arthritis  HPI  Review of Systems   Objective: Vital Signs: BP (!) 141/83   Pulse 96   Ht 5\' 2"  (1.575 m)   Wt 203 lb (92.1 kg)   BMI 37.13 kg/m    Physical Exam  Ortho Exam awake alert and oriented 3. Comfortable sitting. Pain directly over the greater trochanter left hip. Skin intact. No crepitation. No skin changes. Painless range of motion of hip. Straight leg raise negative.  Specialty Comments:  No specialty comments available.  Imaging: No results found.   PMFS History: Patient Active Problem List   Diagnosis Date Noted  . Insomnia secondary to depression with anxiety 12/06/2012  . Chronic pain syndrome 10/16/2012  . Unspecified vitamin D deficiency 10/16/2012  . MDD (major depressive disorder) 02/15/2012   Past Medical History:  Diagnosis Date  . Anxiety   . Depression   . GERD (gastroesophageal reflux disease)   . Headache(784.0)   . HTN (hypertension)   . Hyperlipidemia   . Insomnia   . Lupus   . Macular degeneration June 2013  . Osteoarthritis June 2013  . Osteoporosis   . PE (pulmonary embolism) 2008   not sure why  . Seizures (HCC)   . Wears glasses     Family History  Problem Relation Age of Onset  . Dementia Father   . ADD / ADHD Other   . ADD / ADHD Other   . Healthy Son   . Healthy Son   . Depression Sister   . Anxiety disorder Sister   . Sexual abuse Sister   .  Alcohol abuse Paternal Aunt   . Drug abuse Paternal Uncle   . Bipolar disorder Neg Hx   . OCD Neg Hx   . Paranoid behavior Neg Hx   . Schizophrenia Neg Hx   . Seizures Neg Hx   . Physical abuse Neg Hx     Past Surgical History:  Procedure Laterality Date  . abdomnial surgery    . ANKLE FRACTURE SURGERY  2000   right  . APPENDECTOMY    . CARDIAC CATHETERIZATION  2008   PE-filter in  . COLONOSCOPY    . DILITATION & CURRETTAGE/HYSTROSCOPY WITH ESSURE    . EYE SURGERY     both cataracts  . HARDWARE REMOVAL Left 02/26/2014   Procedure: REMOVAL HARDWARE ;  Surgeon: Tami Ribas, MD;  Location: [facility];  Service: Orthopedics;  Laterality: Left;  . TONSILLECTOMY    . WRIST SURGERY  2011   may-lt    Social History   Occupational History  . Not on file  Tobacco Use  . Smoking status: Former Smoker    Last attempt to quit: 02/19/1978    Years since quitting: 39.5  . Smokeless tobacco: Never Used  Substance and Sexual Activity  . Alcohol use: No  . Drug use: No  . Sexual activity: No     Valeria Batman, MD   Note - This record has been created using [employer].  Chart creation errors have been sought, but may not always  have been located. Such creation errors do not reflect on  the standard of medical care.

## 2017-09-22 ENCOUNTER — Ambulatory Visit (HOSPITAL_COMMUNITY): Payer: Self-pay | Admitting: Psychiatry

## 2017-09-23 ENCOUNTER — Ambulatory Visit (INDEPENDENT_AMBULATORY_CARE_PROVIDER_SITE_OTHER): Payer: Medicare Other | Admitting: Psychiatry

## 2017-09-23 ENCOUNTER — Encounter (HOSPITAL_COMMUNITY): Payer: Self-pay | Admitting: Psychiatry

## 2017-09-23 VITALS — BP 152/86 | HR 105 | Resp 95 | Ht 62.0 in | Wt 205.0 lb

## 2017-09-23 DIAGNOSIS — Z79899 Other long term (current) drug therapy: Secondary | ICD-10-CM

## 2017-09-23 DIAGNOSIS — Z818 Family history of other mental and behavioral disorders: Secondary | ICD-10-CM

## 2017-09-23 DIAGNOSIS — Z299 Encounter for prophylactic measures, unspecified: Secondary | ICD-10-CM | POA: Diagnosis not present

## 2017-09-23 DIAGNOSIS — F331 Major depressive disorder, recurrent, moderate: Secondary | ICD-10-CM

## 2017-09-23 DIAGNOSIS — Z811 Family history of alcohol abuse and dependence: Secondary | ICD-10-CM | POA: Diagnosis not present

## 2017-09-23 DIAGNOSIS — J449 Chronic obstructive pulmonary disease, unspecified: Secondary | ICD-10-CM

## 2017-09-23 DIAGNOSIS — Z813 Family history of other psychoactive substance abuse and dependence: Secondary | ICD-10-CM

## 2017-09-23 DIAGNOSIS — M329 Systemic lupus erythematosus, unspecified: Secondary | ICD-10-CM | POA: Diagnosis not present

## 2017-09-23 DIAGNOSIS — R5383 Other fatigue: Secondary | ICD-10-CM

## 2017-09-23 DIAGNOSIS — I2699 Other pulmonary embolism without acute cor pulmonale: Secondary | ICD-10-CM | POA: Diagnosis not present

## 2017-09-23 DIAGNOSIS — Z6831 Body mass index (BMI) 31.0-31.9, adult: Secondary | ICD-10-CM | POA: Diagnosis not present

## 2017-09-23 DIAGNOSIS — Z87891 Personal history of nicotine dependence: Secondary | ICD-10-CM | POA: Diagnosis not present

## 2017-09-23 DIAGNOSIS — Z713 Dietary counseling and surveillance: Secondary | ICD-10-CM | POA: Diagnosis not present

## 2017-09-23 MED ORDER — ARIPIPRAZOLE 10 MG PO TABS: 15.0000 mg | ORAL_TABLET | Freq: Every day | ORAL | 3 refills | Status: DC

## 2017-09-23 MED ORDER — METHYLPHENIDATE HCL 10 MG PO TABS: 10.0000 mg | ORAL_TABLET | Freq: Two times a day (BID) | ORAL | 0 refills | Status: DC

## 2017-09-23 MED ORDER — DIAZEPAM 2 MG PO TABS: 2.0000 mg | ORAL_TABLET | Freq: Three times a day (TID) | ORAL | 2 refills | Status: DC | PRN

## 2017-09-23 MED ORDER — DULOXETINE HCL 60 MG PO CPEP: 60.0000 mg | ORAL_CAPSULE | Freq: Two times a day (BID) | ORAL | 3 refills | Status: DC
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
## 2017-09-27 ENCOUNTER — Telehealth (HOSPITAL_COMMUNITY): Payer: Self-pay | Admitting: *Deleted

## 2017-09-27 NOTE — Telephone Encounter (Signed)
Letter faxed from [employer] stating Aripiprazole has been approved from 09/19/17-09/05/18. ID number [id number].

## 2017-10-17 ENCOUNTER — Other Ambulatory Visit (HOSPITAL_COMMUNITY): Payer: Self-pay | Admitting: Psychiatry

## 2017-10-17 ENCOUNTER — Telehealth (HOSPITAL_COMMUNITY): Payer: Self-pay | Admitting: *Deleted

## 2017-10-17 DIAGNOSIS — Z6831 Body mass index (BMI) 31.0-31.9, adult: Secondary | ICD-10-CM | POA: Diagnosis not present

## 2017-10-17 DIAGNOSIS — L930 Discoid lupus erythematosus: Secondary | ICD-10-CM | POA: Diagnosis not present

## 2017-10-17 DIAGNOSIS — J449 Chronic obstructive pulmonary disease, unspecified: Secondary | ICD-10-CM | POA: Diagnosis not present

## 2017-10-17 DIAGNOSIS — M545 Low back pain: Secondary | ICD-10-CM | POA: Diagnosis not present

## 2017-10-17 DIAGNOSIS — Z299 Encounter for prophylactic measures, unspecified: Secondary | ICD-10-CM | POA: Diagnosis not present

## 2017-10-17 DIAGNOSIS — K210 Gastro-esophageal reflux disease with esophagitis: Secondary | ICD-10-CM | POA: Diagnosis not present

## 2017-10-17 DIAGNOSIS — E1165 Type 2 diabetes mellitus with hyperglycemia: Secondary | ICD-10-CM | POA: Diagnosis not present

## 2017-10-17 DIAGNOSIS — I2699 Other pulmonary embolism without acute cor pulmonale: Secondary | ICD-10-CM | POA: Diagnosis not present

## 2017-10-17 DIAGNOSIS — I10 Essential (primary) hypertension: Secondary | ICD-10-CM | POA: Diagnosis not present

## 2017-10-17 MED ORDER — METHYLPHENIDATE HCL 10 MG PO TABS: 10.0000 mg | ORAL_TABLET | Freq: Two times a day (BID) | ORAL | 0 refills | Status: DC

## 2017-10-17 NOTE — Telephone Encounter (Signed)
Ordered for a month.   I have utilized the [place] Controlled Substances Reporting System (PMP AWARxE) to confirm adherence regarding the patient's medication. My review reveals appropriate prescription fills.

## 2017-10-17 NOTE — Telephone Encounter (Signed)
Dr Vanetta Shawl This is a Dr Tenny Craw Patient her appointment had to be r/s from 2/15 due to Dr Tenny Craw out of office to 2/25 She stated she will be out of medication before the appointment. Requesting refill on Ritalin says she has 5 days left

## 2017-10-20 ENCOUNTER — Ambulatory Visit (HOSPITAL_COMMUNITY): Payer: Self-pay | Admitting: Psychiatry

## 2017-10-20 DIAGNOSIS — I4891 Unspecified atrial fibrillation: Secondary | ICD-10-CM | POA: Diagnosis not present

## 2017-10-20 DIAGNOSIS — I2510 Atherosclerotic heart disease of native coronary artery without angina pectoris: Secondary | ICD-10-CM | POA: Diagnosis not present

## 2017-10-20 DIAGNOSIS — M159 Polyosteoarthritis, unspecified: Secondary | ICD-10-CM | POA: Diagnosis not present

## 2017-10-20 DIAGNOSIS — I10 Essential (primary) hypertension: Secondary | ICD-10-CM | POA: Diagnosis not present

## 2017-10-25 ENCOUNTER — Telehealth (HOSPITAL_COMMUNITY): Payer: Self-pay | Admitting: *Deleted

## 2017-10-25 ENCOUNTER — Ambulatory Visit: Payer: Self-pay | Admitting: Cardiology

## 2017-10-25 ENCOUNTER — Encounter: Payer: Self-pay | Admitting: *Deleted

## 2017-10-25 NOTE — Telephone Encounter (Signed)
Prior authorization for Abilify received. Approved previously for 1 tab daily. Patient needs 1.5 daily. Called [employer] gave clinical information to [place] who states that it is pending and decision will be faxed. Case UD#[id number].

## 2017-10-25 NOTE — Progress Notes (Deleted)
Clinical Summary Ms. Alumbaugh is a 82 y.o.female seen today for follow up of the following medical problems.    1. CAD - cath 2008 showed borderline CAD per pcp notes, only a D2 50% lesion is mentioned. Do not have cath report at this time.  - SOB started about 1 year ago. Noted when working with PT. Only occurs with activity. Can occur walking room to room at home - no chest pain. No recent edema. No coughing or wheezing. Former smoker x 20 years. Reports PFTs long time ago - has had some recent SOB   11/2016 echo at [facility]: LVEF 60-65%, abnormal diastolic function - 01/2017 nuclear stress: no ischemia  - no recent chest pain. Stable SOB.  - sedentary lifestyle, limited by leg pains.   2. History of PE - diagnosed in 2008, has been on coumadin since that time  3. SLE  4. SOB - echo 11/2016 at [facility]: LVEF 60-65%, grade I diastolic dysfunction, normal IVC - recent stress test without ischemia -no clear cardiac cause for her symptoms  - 06/2017 abnormal PFTs, was referred to Dr Juanetta Gosling   5. Depression/anxiety - recent fatigue.  - started on methyphenidate by pysch  Past Medical History:  Diagnosis Date  . Anxiety   . Depression   . GERD (gastroesophageal reflux disease)   . Headache(784.0)   . HTN (hypertension)   . Hyperlipidemia   . Insomnia   . Lupus   . Macular degeneration June 2013  . Osteoarthritis June 2013  . Osteoporosis   . PE (pulmonary embolism) 2008   not sure why  . Seizures (HCC)   . Wears glasses      Allergies  Allergen Reactions  . Tegretol [Carbamazepine] Other (See Comments)    Thought to have caused her Lupus     Current Outpatient Medications  Medication Sig Dispense Refill  . amLODipine (NORVASC) 5 MG tablet Take 5 mg by mouth daily.    . ARIPiprazole (ABILIFY) 10 MG tablet Take 1.5 tablets (15 mg total) by mouth daily. 45 tablet 3  . benazepril (LOTENSIN) 20 MG tablet     . Calcium  Carbonate-Vitamin D (CALCIUM + D PO) Take by mouth 2 (two) times daily.    . celecoxib (CELEBREX) 100 MG capsule TAKE ONE CAPSULE BY MOUTH TWICE DAILY 60 capsule 6  . diazepam (VALIUM) 2 MG tablet Take 1 tablet (2 mg total) by mouth 3 (three) times daily as needed for anxiety. 90 tablet 2  . diclofenac sodium (VOLTAREN) 1 % GEL APPLY 3GM TWICE DAILY  2  . DULoxetine (CYMBALTA) 60 MG capsule Take 1 capsule (60 mg total) by mouth 2 (two) times daily. 60 capsule 3  . hydroxychloroquine (PLAQUENIL) 200 MG tablet Take 300 mg by mouth daily.     Marland Kitchen levETIRAcetam (KEPPRA) 500 MG tablet Take 500 mg by mouth daily.     . methylphenidate (RITALIN) 10 MG tablet Take 1 tablet (10 mg total) by mouth 2 (two) times daily with breakfast and lunch. 60 tablet 0  . metoprolol tartrate (LOPRESSOR) 25 MG tablet Take 25 mg by mouth 2 (two) times daily.    . Multiple Vitamin (MULTIVITAMIN) capsule Take 1 capsule by mouth daily.    . Multiple Vitamins-Minerals (HM COMPLETE 50+ MENS ULTIMATE PO) TAKE ONE TABLET BY MOUTH DAILY  12  . Multiple Vitamins-Minerals (PRESERVISION AREDS) TABS Take 1 tablet by mouth daily.  12  . omega-3 acid ethyl esters (LOVAZA) 1  G capsule Take 2 g by mouth daily.    . pantoprazole (PROTONIX) 40 MG tablet Take 40 mg by mouth daily.    . pravastatin (PRAVACHOL) 80 MG tablet Take 80 mg by mouth daily.    Marland Kitchen tiZANidine (ZANAFLEX) 2 MG tablet Take 2 mg by mouth 2 (two) times daily as needed for muscle spasms.    . traMADol (ULTRAM) 50 MG tablet     . warfarin (COUMADIN) 2 MG tablet Take by mouth as directed.      No current facility-administered medications for this visit.      Past Surgical History:  Procedure Laterality Date  . abdomnial surgery    . ANKLE FRACTURE SURGERY  2000   right  . APPENDECTOMY    . CARDIAC CATHETERIZATION  2008   PE-filter in  . COLONOSCOPY    . DILITATION & CURRETTAGE/HYSTROSCOPY WITH ESSURE    . EYE SURGERY     both cataracts  . HARDWARE REMOVAL Left  02/26/2014   Procedure: REMOVAL HARDWARE ;  Surgeon: Tami Ribas, MD;  Location: [facility];  Service: Orthopedics;  Laterality: Left;  . TONSILLECTOMY    . WRIST SURGERY  2011   may-lt     Allergies  Allergen Reactions  . Tegretol [Carbamazepine] Other (See Comments)    Thought to have caused her Lupus      Family History  Problem Relation Age of Onset  . Dementia Father   . ADD / ADHD Other   . ADD / ADHD Other   . Healthy Son   . Healthy Son   . Depression Sister   . Anxiety disorder Sister   . Sexual abuse Sister   . Alcohol abuse Paternal Aunt   . Drug abuse Paternal Uncle   . Bipolar disorder Neg Hx   . OCD Neg Hx   . Paranoid behavior Neg Hx   . Schizophrenia Neg Hx   . Seizures Neg Hx   . Physical abuse Neg Hx      Social History Ms. Barsky reports that she quit smoking about 39 years ago. she has never used smokeless tobacco. Ms. Quesnel reports that she does not drink alcohol.   Review of Systems CONSTITUTIONAL: No weight loss, fever, chills, weakness or fatigue.  HEENT: Eyes: No visual loss, blurred vision, double vision or yellow sclerae.No hearing loss, sneezing, congestion, runny nose or sore throat.  SKIN: No rash or itching.  CARDIOVASCULAR:  RESPIRATORY: No shortness of breath, cough or sputum.  GASTROINTESTINAL: No anorexia, nausea, vomiting or diarrhea. No abdominal pain or blood.  GENITOURINARY: No burning on urination, no polyuria NEUROLOGICAL: No headache, dizziness, syncope, paralysis, ataxia, numbness or tingling in the extremities. No change in bowel or bladder control.  MUSCULOSKELETAL: No muscle, back pain, joint pain or stiffness.  LYMPHATICS: No enlarged nodes. No history of splenectomy.  PSYCHIATRIC: No history of depression or anxiety.  ENDOCRINOLOGIC: No reports of sweating, cold or heat intolerance. No polyuria or polydipsia.  Marland Kitchen   Physical Examination There were no vitals filed for this visit. There were no  vitals filed for this visit.  Gen: resting comfortably, no acute distress HEENT: no scleral icterus, pupils equal round and reactive, no palptable cervical adenopathy,  CV Resp: Clear to auscultation bilaterally GI: abdomen is soft, non-tender, non-distended, normal bowel sounds, no hepatosplenomegaly MSK: extremities are warm, no edema.  Skin: warm, no rash Neuro:  no focal deficits Psych: appropriate affect   Diagnostic Studies 01/2017 Nuclear stress  There was no ST segment deviation noted during stress.  The study is normal.  This is a low risk study.  Nuclear stress EF: 61%.   11/2016 echo at [facility]: LVEF 60-65%, abnormal diastolic function  06/2017 PFTs Mild vent defect, high airway resistance, severe DLCO reduction moderate with volume correction.  Assessment and Plan  1. CAD - recent stress test without ischemia, echo with normal LVEF - continue current meds  2. SOB - negative cardiac testing including stress test and echo. She does have history of tobacco use, will order PFTs    F/u pending test results        Antoine Poche, M.D., F.A.C.C.

## 2017-10-26 ENCOUNTER — Ambulatory Visit (HOSPITAL_COMMUNITY): Payer: Self-pay | Admitting: Psychiatry

## 2017-10-27 ENCOUNTER — Telehealth (HOSPITAL_COMMUNITY): Payer: Self-pay | Admitting: *Deleted

## 2017-10-27 NOTE — Telephone Encounter (Signed)
Received approval letter from [employer] for Aripiprazole 10mg  from 10/25/17-10/25/18.

## 2017-11-02 ENCOUNTER — Ambulatory Visit (HOSPITAL_COMMUNITY): Payer: Medicare Other | Admitting: Psychiatry

## 2017-11-08 ENCOUNTER — Other Ambulatory Visit (INDEPENDENT_AMBULATORY_CARE_PROVIDER_SITE_OTHER): Payer: Self-pay | Admitting: Specialist

## 2017-11-08 ENCOUNTER — Other Ambulatory Visit (INDEPENDENT_AMBULATORY_CARE_PROVIDER_SITE_OTHER): Payer: Self-pay | Admitting: Radiology

## 2017-11-08 DIAGNOSIS — H02889 Meibomian gland dysfunction of unspecified eye, unspecified eyelid: Secondary | ICD-10-CM | POA: Diagnosis not present

## 2017-11-08 DIAGNOSIS — H353131 Nonexudative age-related macular degeneration, bilateral, early dry stage: Secondary | ICD-10-CM | POA: Diagnosis not present

## 2017-11-08 DIAGNOSIS — M3210 Systemic lupus erythematosus, organ or system involvement unspecified: Secondary | ICD-10-CM | POA: Diagnosis not present

## 2017-11-08 DIAGNOSIS — H1045 Other chronic allergic conjunctivitis: Secondary | ICD-10-CM | POA: Diagnosis not present

## 2017-11-08 MED ORDER — CELECOXIB 100 MG PO CAPS: 100.0000 mg | ORAL_CAPSULE | Freq: Two times a day (BID) | ORAL | 1 refills | Status: DC

## 2017-11-08 NOTE — Telephone Encounter (Signed)
Ok to refill 

## 2017-11-08 NOTE — Telephone Encounter (Signed)
Dr. Cleophas Dunker patient

## 2017-11-08 NOTE — Telephone Encounter (Signed)
Would you like to refill meds?

## 2017-11-08 NOTE — Telephone Encounter (Signed)
Called pt to notify rx was ready for pick up at pharmacy.

## 2017-11-10 ENCOUNTER — Ambulatory Visit (HOSPITAL_COMMUNITY): Payer: Self-pay | Admitting: Psychiatry

## 2017-11-14 DIAGNOSIS — Z299 Encounter for prophylactic measures, unspecified: Secondary | ICD-10-CM | POA: Diagnosis not present

## 2017-11-14 DIAGNOSIS — E1165 Type 2 diabetes mellitus with hyperglycemia: Secondary | ICD-10-CM | POA: Diagnosis not present

## 2017-11-14 DIAGNOSIS — I2699 Other pulmonary embolism without acute cor pulmonale: Secondary | ICD-10-CM | POA: Diagnosis not present

## 2017-11-14 DIAGNOSIS — E785 Hyperlipidemia, unspecified: Secondary | ICD-10-CM | POA: Diagnosis not present

## 2017-11-14 DIAGNOSIS — I4891 Unspecified atrial fibrillation: Secondary | ICD-10-CM | POA: Diagnosis not present

## 2017-11-14 DIAGNOSIS — R569 Unspecified convulsions: Secondary | ICD-10-CM | POA: Diagnosis not present

## 2017-11-14 DIAGNOSIS — M329 Systemic lupus erythematosus, unspecified: Secondary | ICD-10-CM | POA: Diagnosis not present

## 2017-11-14 DIAGNOSIS — I10 Essential (primary) hypertension: Secondary | ICD-10-CM | POA: Diagnosis not present

## 2017-11-15 DIAGNOSIS — I10 Essential (primary) hypertension: Secondary | ICD-10-CM | POA: Diagnosis not present

## 2017-11-15 DIAGNOSIS — I4891 Unspecified atrial fibrillation: Secondary | ICD-10-CM | POA: Diagnosis not present

## 2017-11-15 DIAGNOSIS — I2510 Atherosclerotic heart disease of native coronary artery without angina pectoris: Secondary | ICD-10-CM | POA: Diagnosis not present

## 2017-11-15 DIAGNOSIS — M159 Polyosteoarthritis, unspecified: Secondary | ICD-10-CM | POA: Diagnosis not present

## 2017-11-17 ENCOUNTER — Ambulatory Visit (HOSPITAL_COMMUNITY): Payer: Self-pay | Admitting: Psychiatry

## 2017-11-18 DIAGNOSIS — J069 Acute upper respiratory infection, unspecified: Secondary | ICD-10-CM | POA: Diagnosis not present

## 2017-11-18 DIAGNOSIS — I4891 Unspecified atrial fibrillation: Secondary | ICD-10-CM | POA: Diagnosis not present

## 2017-11-18 DIAGNOSIS — E1165 Type 2 diabetes mellitus with hyperglycemia: Secondary | ICD-10-CM | POA: Diagnosis not present

## 2017-11-18 DIAGNOSIS — Z299 Encounter for prophylactic measures, unspecified: Secondary | ICD-10-CM | POA: Diagnosis not present

## 2017-11-18 DIAGNOSIS — R569 Unspecified convulsions: Secondary | ICD-10-CM | POA: Diagnosis not present

## 2017-11-18 DIAGNOSIS — J449 Chronic obstructive pulmonary disease, unspecified: Secondary | ICD-10-CM | POA: Diagnosis not present

## 2017-11-18 DIAGNOSIS — I2699 Other pulmonary embolism without acute cor pulmonale: Secondary | ICD-10-CM | POA: Diagnosis not present

## 2017-11-18 DIAGNOSIS — K219 Gastro-esophageal reflux disease without esophagitis: Secondary | ICD-10-CM | POA: Diagnosis not present

## 2017-11-18 DIAGNOSIS — Z6832 Body mass index (BMI) 32.0-32.9, adult: Secondary | ICD-10-CM | POA: Diagnosis not present

## 2017-11-21 ENCOUNTER — Ambulatory Visit (HOSPITAL_COMMUNITY): Payer: Self-pay | Admitting: Psychiatry

## 2017-12-01 ENCOUNTER — Ambulatory Visit (HOSPITAL_COMMUNITY): Payer: Medicare Other | Admitting: Psychiatry

## 2017-12-07 ENCOUNTER — Ambulatory Visit (INDEPENDENT_AMBULATORY_CARE_PROVIDER_SITE_OTHER): Payer: Medicare Other | Admitting: Psychiatry

## 2017-12-07 ENCOUNTER — Encounter (HOSPITAL_COMMUNITY): Payer: Self-pay | Admitting: Psychiatry

## 2017-12-07 VITALS — BP 139/82 | HR 100 | Ht 62.0 in | Wt 202.0 lb

## 2017-12-07 DIAGNOSIS — R450 Nervousness: Secondary | ICD-10-CM | POA: Diagnosis not present

## 2017-12-07 DIAGNOSIS — Z811 Family history of alcohol abuse and dependence: Secondary | ICD-10-CM

## 2017-12-07 DIAGNOSIS — Z810 Family history of intellectual disabilities: Secondary | ICD-10-CM

## 2017-12-07 DIAGNOSIS — Z813 Family history of other psychoactive substance abuse and dependence: Secondary | ICD-10-CM | POA: Diagnosis not present

## 2017-12-07 DIAGNOSIS — Z87891 Personal history of nicotine dependence: Secondary | ICD-10-CM | POA: Diagnosis not present

## 2017-12-07 DIAGNOSIS — F419 Anxiety disorder, unspecified: Secondary | ICD-10-CM

## 2017-12-07 DIAGNOSIS — M2550 Pain in unspecified joint: Secondary | ICD-10-CM

## 2017-12-07 DIAGNOSIS — Z818 Family history of other mental and behavioral disorders: Secondary | ICD-10-CM

## 2017-12-07 DIAGNOSIS — F331 Major depressive disorder, recurrent, moderate: Secondary | ICD-10-CM | POA: Diagnosis not present

## 2017-12-07 MED ORDER — ARIPIPRAZOLE 10 MG PO TABS: 15.0000 mg | ORAL_TABLET | Freq: Every day | ORAL | 3 refills | Status: DC

## 2017-12-07 MED ORDER — DULOXETINE HCL 60 MG PO CPEP: 60.0000 mg | ORAL_CAPSULE | Freq: Two times a day (BID) | ORAL | 3 refills | Status: DC

## 2017-12-07 MED ORDER — DIAZEPAM 2 MG PO TABS: 2.0000 mg | ORAL_TABLET | Freq: Three times a day (TID) | ORAL | 2 refills | Status: DC | PRN

## 2017-12-07 MED ORDER — METHYLPHENIDATE HCL 10 MG PO TABS: 10.0000 mg | ORAL_TABLET | Freq: Two times a day (BID) | ORAL | 0 refills | Status: DC
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
## 2017-12-12 DIAGNOSIS — I10 Essential (primary) hypertension: Secondary | ICD-10-CM | POA: Diagnosis not present

## 2017-12-12 DIAGNOSIS — I2699 Other pulmonary embolism without acute cor pulmonale: Secondary | ICD-10-CM | POA: Diagnosis not present

## 2017-12-12 DIAGNOSIS — Z299 Encounter for prophylactic measures, unspecified: Secondary | ICD-10-CM | POA: Diagnosis not present

## 2017-12-12 DIAGNOSIS — Z6832 Body mass index (BMI) 32.0-32.9, adult: Secondary | ICD-10-CM | POA: Diagnosis not present

## 2017-12-12 DIAGNOSIS — Z713 Dietary counseling and surveillance: Secondary | ICD-10-CM | POA: Diagnosis not present

## 2017-12-13 ENCOUNTER — Ambulatory Visit: Payer: Self-pay | Admitting: Cardiology

## 2018-01-04 ENCOUNTER — Ambulatory Visit: Payer: Self-pay | Admitting: Cardiology

## 2018-01-05 ENCOUNTER — Encounter: Payer: Self-pay | Admitting: Cardiology

## 2018-01-05 ENCOUNTER — Ambulatory Visit (INDEPENDENT_AMBULATORY_CARE_PROVIDER_SITE_OTHER): Payer: Medicare Other | Admitting: Cardiology

## 2018-01-05 ENCOUNTER — Encounter: Payer: Self-pay | Admitting: *Deleted

## 2018-01-05 ENCOUNTER — Other Ambulatory Visit: Payer: Self-pay

## 2018-01-05 VITALS — BP 129/78 | HR 88 | Ht 66.0 in | Wt 202.0 lb

## 2018-01-05 DIAGNOSIS — R0602 Shortness of breath: Secondary | ICD-10-CM

## 2018-01-05 DIAGNOSIS — I2510 Atherosclerotic heart disease of native coronary artery without angina pectoris: Secondary | ICD-10-CM | POA: Diagnosis not present

## 2018-01-05 NOTE — Patient Instructions (Signed)

## 2018-01-05 NOTE — Progress Notes (Signed)
Clinical Summary Grace Howard is a 82 y.o.female seen today for follow up of the following medical problems.    1. CAD - cath 2008 showed borderline CAD per pcp notes, only a D2 50% lesion is mentioned. Do not have cath report at this time.  -11/2016 echo at [facility]: LVEF 60-65%, abnormal diastolic function - 01/2017 nuclear stress: no ischemia   - no recent chest pain - compliant with meds  2. History of PE - diagnosed in 2008, has been on coumadin since that time - doing well with coumadin  3. SLE   4. SOB - echo 11/2016 at [facility]: LVEF 60-65%, grade I diastolic dysfunction, normal IVC - recent stress test without ischemia -no clear cardiac cause for her symptoms   - 06/2017 PFTs abnormal, referred to Dr Juanetta Gosling - diagnosed with COPD. Started on inhaler trilegy. - breathing remains variable based on activity, some improvement with inhaler.   Past Medical History:  Diagnosis Date  . Anxiety   . Depression   . GERD (gastroesophageal reflux disease)   . Headache(784.0)   . HTN (hypertension)   . Hyperlipidemia   . Insomnia   . Lupus (HCC)   . Macular degeneration June 2013  . Osteoarthritis June 2013  . Osteoporosis   . PE (pulmonary embolism) 2008   not sure why  . Seizures (HCC)   . Wears glasses      Allergies  Allergen Reactions  . Tegretol [Carbamazepine] Other (See Comments)    Thought to have caused her Lupus     Current Outpatient Medications  Medication Sig Dispense Refill  . amLODipine (NORVASC) 5 MG tablet Take 5 mg by mouth daily.    . ARIPiprazole (ABILIFY) 10 MG tablet Take 1.5 tablets (15 mg total) by mouth daily. 45 tablet 3  . benazepril (LOTENSIN) 20 MG tablet     . Calcium Carbonate-Vitamin D (CALCIUM + D PO) Take by mouth 2 (two) times daily.    . celecoxib (CELEBREX) 100 MG capsule Take 1 capsule (100 mg total) by mouth 2 (two) times daily. 60 capsule 1  . diazepam (VALIUM) 2 MG tablet Take 1 tablet (2  mg total) by mouth 3 (three) times daily as needed for anxiety. 90 tablet 2  . diclofenac sodium (VOLTAREN) 1 % GEL APPLY 3GM TWICE DAILY  2  . DULoxetine (CYMBALTA) 60 MG capsule Take 1 capsule (60 mg total) by mouth 2 (two) times daily. 60 capsule 3  . hydroxychloroquine (PLAQUENIL) 200 MG tablet Take 300 mg by mouth daily.     Marland Kitchen levETIRAcetam (KEPPRA) 500 MG tablet Take 500 mg by mouth daily.     . methylphenidate (RITALIN) 10 MG tablet Take 1 tablet (10 mg total) by mouth 2 (two) times daily with breakfast and lunch. 60 tablet 0  . metoprolol tartrate (LOPRESSOR) 25 MG tablet Take 25 mg by mouth 2 (two) times daily.    . Multiple Vitamin (MULTIVITAMIN) capsule Take 1 capsule by mouth daily.    . Multiple Vitamins-Minerals (HM COMPLETE 50+ MENS ULTIMATE PO) TAKE ONE TABLET BY MOUTH DAILY  12  . Multiple Vitamins-Minerals (PRESERVISION AREDS) TABS Take 1 tablet by mouth daily.  12  . omega-3 acid ethyl esters (LOVAZA) 1 G capsule Take 2 g by mouth daily.    . pantoprazole (PROTONIX) 40 MG tablet Take 40 mg by mouth daily.    . pravastatin (PRAVACHOL) 80 MG tablet Take 80 mg by mouth daily.    Marland Kitchen  tiZANidine (ZANAFLEX) 2 MG tablet Take 2 mg by mouth 2 (two) times daily as needed for muscle spasms.    . traMADol (ULTRAM) 50 MG tablet     . warfarin (COUMADIN) 2 MG tablet Take by mouth as directed.      No current facility-administered medications for this visit.      Past Surgical History:  Procedure Laterality Date  . abdomnial surgery    . ANKLE FRACTURE SURGERY  2000   right  . APPENDECTOMY    . CARDIAC CATHETERIZATION  2008   PE-filter in  . COLONOSCOPY    . DILITATION & CURRETTAGE/HYSTROSCOPY WITH ESSURE    . EYE SURGERY     both cataracts  . HARDWARE REMOVAL Left 02/26/2014   Procedure: REMOVAL HARDWARE ;  Surgeon: Tami Ribas, MD;  Location: [facility];  Service: Orthopedics;  Laterality: Left;  . TONSILLECTOMY    . WRIST SURGERY  2011   may-lt      Allergies  Allergen Reactions  . Tegretol [Carbamazepine] Other (See Comments)    Thought to have caused her Lupus      Family History  Problem Relation Age of Onset  . Dementia Father   . ADD / ADHD Other   . ADD / ADHD Other   . Healthy Son   . Healthy Son   . Depression Sister   . Anxiety disorder Sister   . Sexual abuse Sister   . Alcohol abuse Paternal Aunt   . Drug abuse Paternal Uncle   . Bipolar disorder Neg Hx   . OCD Neg Hx   . Paranoid behavior Neg Hx   . Schizophrenia Neg Hx   . Seizures Neg Hx   . Physical abuse Neg Hx      Social History Grace Howard reports that she quit smoking about 39 years ago. She has never used smokeless tobacco. Grace Howard reports that she does not drink alcohol.   Review of Systems CONSTITUTIONAL: No weight loss, fever, chills, weakness or fatigue.  HEENT: Eyes: No visual loss, blurred vision, double vision or yellow sclerae.No hearing loss, sneezing, congestion, runny nose or sore throat.  SKIN: No rash or itching.  CARDIOVASCULAR: per hpi RESPIRATORY: per hpi.  GASTROINTESTINAL: No anorexia, nausea, vomiting or diarrhea. No abdominal pain or blood.  GENITOURINARY: No burning on urination, no polyuria NEUROLOGICAL: No headache, dizziness, syncope, paralysis, ataxia, numbness or tingling in the extremities. No change in bowel or bladder control.  MUSCULOSKELETAL: No muscle, back pain, joint pain or stiffness.  LYMPHATICS: No enlarged nodes. No history of splenectomy.  PSYCHIATRIC: No history of depression or anxiety.  ENDOCRINOLOGIC: No reports of sweating, cold or heat intolerance. No polyuria or polydipsia.  Marland Kitchen   Physical Examination Vitals:   01/05/18 1252  BP: 129/78  Pulse: 88  SpO2: 98%   Vitals:   01/05/18 1252  Weight: 202 lb (91.6 kg)  Height: 5\' 6"  (1.676 m)    Gen: resting comfortably, no acute distress HEENT: no scleral icterus, pupils equal round and reactive, no palptable cervical adenopathy,   CV: RRR, no m/r/g, no jvd Resp: Clear to auscultation bilaterally GI: abdomen is soft, non-tender, non-distended, normal bowel sounds, no hepatosplenomegaly MSK: extremities are warm, no edema.  Skin: warm, no rash Neuro:  no focal deficits Psych: appropriate affect   Diagnostic Studies 01/2017 Nuclear stress  There was no ST segment deviation noted during stress.  The study is normal.  This is a low risk study.  Nuclear stress EF: 61%.     Assessment and Plan  1. CAD - recent stress test without ischemia, echo with normal LVEF - she will continue current meds  2. SOB - negative cardiac testing including stress test and echo. She does have history of tobacco use,abnormal PFTs. Now followed by Dr Juanetta Gosling - follow symptoms with additional therapy for her COPD    F/u 1 year    Antoine Poche, M.D.

## 2018-01-06 ENCOUNTER — Encounter: Payer: Self-pay | Admitting: Cardiology

## 2018-01-10 ENCOUNTER — Other Ambulatory Visit (INDEPENDENT_AMBULATORY_CARE_PROVIDER_SITE_OTHER): Payer: Self-pay | Admitting: Orthopaedic Surgery

## 2018-01-10 NOTE — Telephone Encounter (Signed)
Please advise 

## 2018-01-10 NOTE — Telephone Encounter (Signed)
Ok

## 2018-01-11 NOTE — Telephone Encounter (Signed)
Ok to renew?  

## 2018-01-13 DIAGNOSIS — M545 Low back pain: Secondary | ICD-10-CM | POA: Diagnosis not present

## 2018-01-13 DIAGNOSIS — Z6829 Body mass index (BMI) 29.0-29.9, adult: Secondary | ICD-10-CM | POA: Diagnosis not present

## 2018-01-13 DIAGNOSIS — Z713 Dietary counseling and surveillance: Secondary | ICD-10-CM | POA: Diagnosis not present

## 2018-01-13 DIAGNOSIS — Z299 Encounter for prophylactic measures, unspecified: Secondary | ICD-10-CM | POA: Diagnosis not present

## 2018-01-13 DIAGNOSIS — I2699 Other pulmonary embolism without acute cor pulmonale: Secondary | ICD-10-CM | POA: Diagnosis not present

## 2018-01-14 ENCOUNTER — Encounter: Payer: Self-pay | Admitting: Cardiology

## 2018-01-18 ENCOUNTER — Encounter (HOSPITAL_COMMUNITY): Payer: Self-pay | Admitting: Psychiatry

## 2018-01-18 ENCOUNTER — Ambulatory Visit (INDEPENDENT_AMBULATORY_CARE_PROVIDER_SITE_OTHER): Payer: Medicare Other | Admitting: Psychiatry

## 2018-01-18 VITALS — BP 124/80 | HR 96 | Ht 66.0 in | Wt 200.0 lb

## 2018-01-18 DIAGNOSIS — Z810 Family history of intellectual disabilities: Secondary | ICD-10-CM | POA: Diagnosis not present

## 2018-01-18 DIAGNOSIS — M2550 Pain in unspecified joint: Secondary | ICD-10-CM | POA: Diagnosis not present

## 2018-01-18 DIAGNOSIS — M549 Dorsalgia, unspecified: Secondary | ICD-10-CM | POA: Diagnosis not present

## 2018-01-18 DIAGNOSIS — Z87891 Personal history of nicotine dependence: Secondary | ICD-10-CM | POA: Diagnosis not present

## 2018-01-18 DIAGNOSIS — Z818 Family history of other mental and behavioral disorders: Secondary | ICD-10-CM

## 2018-01-18 DIAGNOSIS — I2510 Atherosclerotic heart disease of native coronary artery without angina pectoris: Secondary | ICD-10-CM

## 2018-01-18 DIAGNOSIS — Z811 Family history of alcohol abuse and dependence: Secondary | ICD-10-CM | POA: Diagnosis not present

## 2018-01-18 DIAGNOSIS — F331 Major depressive disorder, recurrent, moderate: Secondary | ICD-10-CM | POA: Diagnosis not present

## 2018-01-18 MED ORDER — DIAZEPAM 2 MG PO TABS: 2.0000 mg | ORAL_TABLET | Freq: Three times a day (TID) | ORAL | 2 refills | Status: DC | PRN

## 2018-01-18 MED ORDER — ARIPIPRAZOLE 10 MG PO TABS: 15.0000 mg | ORAL_TABLET | Freq: Every day | ORAL | 3 refills | Status: DC

## 2018-01-18 MED ORDER — METHYLPHENIDATE HCL 10 MG PO TABS: 10.0000 mg | ORAL_TABLET | Freq: Two times a day (BID) | ORAL | 0 refills | Status: DC

## 2018-01-18 MED ORDER — DULOXETINE HCL 60 MG PO CPEP: 60.0000 mg | ORAL_CAPSULE | Freq: Two times a day (BID) | ORAL | 3 refills | Status: DC

## 2018-01-18 NOTE — Progress Notes (Signed)
BH MD/PA/NP OP Progress Note  01/18/2018 1:32 PM Grace Howard  MRN:  [id number]  Chief Complaint:  Chief Complaint    Depression; Anxiety; Follow-up     HPI: This patient is a 82 year oldfemale who lives with her [place]in [place]. She has a sister in [place] and 2 sons who live in [place] in [place]. 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  The patient states that she's had depression for many years. She was in a bad marriage in the 33s and was hospitalized at [facility]. She's been seeing psychiatrist treatment ever since then in the most part she's been fairly stable recently. However it she's worried about several health issues. She had a breast cyst which turned out to be benign. She has thyroid nodules but no change in her thyroid hormone. She also has skin cancer in the thickening of her uterine lining. All these things have been getting her worried. She is only on 2 mg of Valium per day and would like an increase because she is very anxious. Her mood is fairly stable but she tends to stay to herself a lot. She denies crying spells panic attacks suicidal ideation her appetite change  The patient returns after 4 weeks.  She states that her husband has been diagnosed with pulmonary fibrosis and the prognosis is really not good.  She thinks he was exposed to fibers while he worked at [employer] for many years.  He has a very good pulmonologist now and she and her husband are very comfortable with this Dr. and he is trying to get him on a drug to help.  Last time she was very disheartened about all of this but she seems to be doing better now and has accepted the prognosis.  She is even looked into assisted living facilities.  Right now they have help coming in 5 days a week.  The patient is taking methylphenidate 10 mg twice a day and is helping with her energy and focus.  She denies being seriously depressed she is sleeping well. Visit Diagnosis:   ICD-10-CM   1. Major depressive disorder, recurrent episode, moderate (HCC) F33.1     Past Psychiatric History: The patient has had depressive symptoms since 1976.  In 1979 she was admitted in [place] due to depression.  She has tried numerous antidepressants as well as lithium.  She had a second admission in 1986.  She denies any history of psychosis.  She has had long-term outpatient treatment for depression  Past Medical History:  Past Medical History:  Diagnosis Date  . Anxiety   . Depression   . GERD (gastroesophageal reflux disease)   . Headache(784.0)   . HTN (hypertension)   . Hyperlipidemia   . Insomnia   . Lupus (HCC)   . Macular degeneration June 2013  . Osteoarthritis June 2013  . Osteoporosis   . PE (pulmonary embolism) 2008   not sure why  . Seizures (HCC)   . Wears glasses     Past Surgical History:  Procedure Laterality Date  . abdomnial surgery    . ANKLE FRACTURE SURGERY  2000   right  . APPENDECTOMY    . CARDIAC CATHETERIZATION  2008   PE-filter in  . COLONOSCOPY    . DILITATION & CURRETTAGE/HYSTROSCOPY WITH ESSURE    . EYE SURGERY     both cataracts  . HARDWARE REMOVAL Left 02/26/2014   Procedure: REMOVAL HARDWARE ;  Surgeon: Tami Ribas, MD;  Location: [facility];  Service: Orthopedics;  Laterality: Left;  . TONSILLECTOMY    . WRIST SURGERY  2011   may-lt    Family Psychiatric History: See below  Family History:  Family History  Problem Relation Age of Onset  . Dementia Father   . ADD / ADHD Other   . ADD / ADHD Other   . Healthy Son   . Healthy Son   . Depression Sister   . Anxiety disorder Sister   . Sexual abuse Sister   . Alcohol abuse Paternal Aunt   . Drug abuse Paternal Uncle   . Bipolar disorder Neg Hx   . OCD Neg Hx   . Paranoid behavior Neg Hx   . Schizophrenia Neg Hx   . Seizures Neg Hx   . Physical abuse Neg Hx     Social History:  Social History   Socioeconomic History  . Marital status:  Widowed    Spouse name: Not on file  . Number of children: Not on file  . Years of education: Not on file  . Highest education level: Not on file  Occupational History  . Not on file  Social Needs  . Financial resource strain: Not on file  . Food insecurity:    Worry: Not on file    Inability: Not on file  . Transportation needs:    Medical: Not on file    Non-medical: Not on file  Tobacco Use  . Smoking status: Former Smoker    Last attempt to quit: 02/19/1978    Years since quitting: 39.9  . Smokeless tobacco: Never Used  Substance and Sexual Activity  . Alcohol use: No  . Drug use: No  . Sexual activity: Never  Lifestyle  . Physical activity:    Days per week: Not on file    Minutes per session: Not on file  . Stress: Not on file  Relationships  . Social connections:    Talks on phone: Not on file    Gets together: Not on file    Attends religious service: Not on file    Active member of club or organization: Not on file    Attends meetings of clubs or organizations: Not on file    Relationship status: Not on file  Other Topics Concern  . Not on file  Social History Narrative  . Not on file    Allergies:  Allergies  Allergen Reactions  . Tegretol [Carbamazepine] Other (See Comments)    Thought to have caused her Lupus    Metabolic Disorder Labs: Lab Results  Component Value Date   HGBA1C 5.7 (H) 02/15/2012   MPG 117 (H) 02/15/2012   No results found for: PROLACTIN No results found for: CHOL, TRIG, HDL, CHOLHDL, VLDL, LDLCALC No results found for: TSH  Therapeutic Level Labs: No results found for: LITHIUM No results found for: VALPROATE No components found for:  CBMZ  Current Medications: Current Outpatient Medications  Medication Sig Dispense Refill  . amLODipine (NORVASC) 5 MG tablet Take 5 mg by mouth daily.    . ARIPiprazole (ABILIFY) 10 MG tablet Take 1.5 tablets (15 mg total) by mouth daily. 45 tablet 3  . benazepril (LOTENSIN) 20 MG tablet      . Calcium Carbonate-Vitamin D (CALCIUM + D PO) Take by mouth 2 (two) times daily.    . celecoxib (CELEBREX) 100 MG capsule TAKE ONE CAPSULE BY MOUTH TWICE DAILY 60 capsule 1  . diazepam (VALIUM) 2 MG tablet  Take 1 tablet (2 mg total) by mouth 3 (three) times daily as needed for anxiety. 90 tablet 2  . diclofenac sodium (VOLTAREN) 1 % GEL APPLY 3GM TWICE DAILY  2  . DULoxetine (CYMBALTA) 60 MG capsule Take 1 capsule (60 mg total) by mouth 2 (two) times daily. 60 capsule 3  . hydroxychloroquine (PLAQUENIL) 200 MG tablet Take 300 mg by mouth daily.     Marland Kitchen levETIRAcetam (KEPPRA) 500 MG tablet Take 500 mg by mouth daily.     . metoprolol tartrate (LOPRESSOR) 25 MG tablet Take 25 mg by mouth 2 (two) times daily.    . Multiple Vitamin (MULTIVITAMIN) capsule Take 1 capsule by mouth daily.    . Multiple Vitamins-Minerals (HM COMPLETE 50+ MENS ULTIMATE PO) TAKE ONE TABLET BY MOUTH DAILY  12  . Multiple Vitamins-Minerals (PRESERVISION AREDS) TABS Take 1 tablet by mouth daily.  12  . omega-3 acid ethyl esters (LOVAZA) 1 G capsule Take 2 g by mouth daily.    . pantoprazole (PROTONIX) 40 MG tablet Take 40 mg by mouth daily.    . pravastatin (PRAVACHOL) 80 MG tablet Take 80 mg by mouth daily.    Marland Kitchen tiZANidine (ZANAFLEX) 2 MG tablet Take 2 mg by mouth 2 (two) times daily as needed for muscle spasms.    . traMADol (ULTRAM) 50 MG tablet     . warfarin (COUMADIN) 2 MG tablet Take by mouth as directed.     . methylphenidate (RITALIN) 10 MG tablet Take 1 tablet (10 mg total) by mouth 2 (two) times daily with breakfast and lunch. 60 tablet 0  . methylphenidate (RITALIN) 10 MG tablet Take 1 tablet (10 mg total) by mouth 2 (two) times daily with breakfast and lunch. 60 tablet 0  . methylphenidate (RITALIN) 10 MG tablet Take 1 tablet (10 mg total) by mouth 2 (two) times daily with breakfast and lunch. 60 tablet 0   No current facility-administered medications for this visit.      Musculoskeletal: Strength &  Muscle Tone: decreased Gait & Station: unsteady Patient leans: N/A  Psychiatric Specialty Exam: Review of Systems  Musculoskeletal: Positive for back pain, joint pain and myalgias.  Neurological: Positive for weakness.  All other systems reviewed and are negative.   Blood pressure 124/80, pulse 96, height 5\' 6"  (1.676 m), weight 200 lb (90.7 kg), SpO2 95 %.Body mass index is 32.28 kg/m.  General Appearance: Casual, Neat and Well Groomed  Eye Contact:  Good  Speech:  Clear and Coherent  Volume:  Normal  Mood:  Euthymic  Affect:  Congruent  Thought Process:  Goal Directed  Orientation:  Full (Time, Place, and Person)  Thought Content: Rumination   Suicidal Thoughts:  No  Homicidal Thoughts:  No  Memory:  Immediate;   Good Recent;   Good Remote;   Fair  Judgement:  Good  Insight:  Good  Psychomotor Activity:  Decreased  Concentration:  Concentration: Good and Attention Span: Good  Recall:  Good  Fund of Knowledge: Good  Language: Good  Akathisia:  No  Handed:  Right  AIMS (if indicated): not done  Assets:  Communication Skills Desire for Improvement Resilience Social Support Talents/Skills  ADL's:  Intact  Cognition: WNL  Sleep:  Good   Screenings:   Assessment and Plan: This patient is a 82 year old female with a history of depression and anxiety.  She seems to be doing better with the addition of methylphenidate.  She will continue Cymbalta 60 mg twice a day for  depression, Abilify 15 mg daily for augmentation, methylphenidate 10 mg twice a day to help with focus and energy diazepam 2 mg 3 times daily as needed for anxiety.  She will return to see me in 3 months   Diannia Ruder, MD 01/18/2018, 1:32 PM

## 2018-01-20 DIAGNOSIS — Z1504 Genetic susceptibility to malignant neoplasm of endometrium: Secondary | ICD-10-CM | POA: Diagnosis not present

## 2018-01-20 DIAGNOSIS — Z8049 Family history of malignant neoplasm of other genital organs: Secondary | ICD-10-CM | POA: Diagnosis not present

## 2018-01-25 DIAGNOSIS — I4891 Unspecified atrial fibrillation: Secondary | ICD-10-CM | POA: Diagnosis not present

## 2018-01-25 DIAGNOSIS — I2510 Atherosclerotic heart disease of native coronary artery without angina pectoris: Secondary | ICD-10-CM | POA: Diagnosis not present

## 2018-01-25 DIAGNOSIS — M159 Polyosteoarthritis, unspecified: Secondary | ICD-10-CM | POA: Diagnosis not present

## 2018-01-25 DIAGNOSIS — I10 Essential (primary) hypertension: Secondary | ICD-10-CM | POA: Diagnosis not present

## 2018-02-10 DIAGNOSIS — I2699 Other pulmonary embolism without acute cor pulmonale: Secondary | ICD-10-CM | POA: Diagnosis not present

## 2018-02-10 DIAGNOSIS — I4891 Unspecified atrial fibrillation: Secondary | ICD-10-CM | POA: Diagnosis not present

## 2018-02-10 DIAGNOSIS — Z299 Encounter for prophylactic measures, unspecified: Secondary | ICD-10-CM | POA: Diagnosis not present

## 2018-02-10 DIAGNOSIS — I10 Essential (primary) hypertension: Secondary | ICD-10-CM | POA: Diagnosis not present

## 2018-02-10 DIAGNOSIS — Z6830 Body mass index (BMI) 30.0-30.9, adult: Secondary | ICD-10-CM | POA: Diagnosis not present

## 2018-02-10 DIAGNOSIS — R569 Unspecified convulsions: Secondary | ICD-10-CM | POA: Diagnosis not present

## 2018-02-10 DIAGNOSIS — E785 Hyperlipidemia, unspecified: Secondary | ICD-10-CM | POA: Diagnosis not present

## 2018-02-10 DIAGNOSIS — E1165 Type 2 diabetes mellitus with hyperglycemia: Secondary | ICD-10-CM | POA: Diagnosis not present

## 2018-02-10 DIAGNOSIS — J449 Chronic obstructive pulmonary disease, unspecified: Secondary | ICD-10-CM | POA: Diagnosis not present

## 2018-02-12 DIAGNOSIS — Z7951 Long term (current) use of inhaled steroids: Secondary | ICD-10-CM | POA: Diagnosis not present

## 2018-02-12 DIAGNOSIS — E860 Dehydration: Secondary | ICD-10-CM | POA: Diagnosis not present

## 2018-02-12 DIAGNOSIS — M329 Systemic lupus erythematosus, unspecified: Secondary | ICD-10-CM | POA: Diagnosis not present

## 2018-02-12 DIAGNOSIS — Z79899 Other long term (current) drug therapy: Secondary | ICD-10-CM | POA: Diagnosis not present

## 2018-02-12 DIAGNOSIS — K219 Gastro-esophageal reflux disease without esophagitis: Secondary | ICD-10-CM | POA: Diagnosis not present

## 2018-02-12 DIAGNOSIS — M1990 Unspecified osteoarthritis, unspecified site: Secondary | ICD-10-CM | POA: Diagnosis not present

## 2018-02-12 DIAGNOSIS — R42 Dizziness and giddiness: Secondary | ICD-10-CM | POA: Diagnosis not present

## 2018-02-12 DIAGNOSIS — Z7901 Long term (current) use of anticoagulants: Secondary | ICD-10-CM | POA: Diagnosis not present

## 2018-02-12 DIAGNOSIS — F419 Anxiety disorder, unspecified: Secondary | ICD-10-CM | POA: Diagnosis not present

## 2018-02-12 DIAGNOSIS — I10 Essential (primary) hypertension: Secondary | ICD-10-CM | POA: Diagnosis not present

## 2018-02-12 DIAGNOSIS — R531 Weakness: Secondary | ICD-10-CM | POA: Diagnosis not present

## 2018-02-12 DIAGNOSIS — R197 Diarrhea, unspecified: Secondary | ICD-10-CM | POA: Diagnosis not present

## 2018-02-12 DIAGNOSIS — Z86711 Personal history of pulmonary embolism: Secondary | ICD-10-CM | POA: Diagnosis not present

## 2018-02-20 DIAGNOSIS — I2699 Other pulmonary embolism without acute cor pulmonale: Secondary | ICD-10-CM | POA: Diagnosis not present

## 2018-02-20 DIAGNOSIS — Z6829 Body mass index (BMI) 29.0-29.9, adult: Secondary | ICD-10-CM | POA: Diagnosis not present

## 2018-02-20 DIAGNOSIS — J449 Chronic obstructive pulmonary disease, unspecified: Secondary | ICD-10-CM | POA: Diagnosis not present

## 2018-02-20 DIAGNOSIS — Z299 Encounter for prophylactic measures, unspecified: Secondary | ICD-10-CM | POA: Diagnosis not present

## 2018-02-20 DIAGNOSIS — I10 Essential (primary) hypertension: Secondary | ICD-10-CM | POA: Diagnosis not present

## 2018-02-20 DIAGNOSIS — Z111 Encounter for screening for respiratory tuberculosis: Secondary | ICD-10-CM | POA: Diagnosis not present

## 2018-02-22 ENCOUNTER — Ambulatory Visit (INDEPENDENT_AMBULATORY_CARE_PROVIDER_SITE_OTHER): Payer: Medicare Other | Admitting: Orthopaedic Surgery

## 2018-02-22 ENCOUNTER — Encounter (INDEPENDENT_AMBULATORY_CARE_PROVIDER_SITE_OTHER): Payer: Self-pay | Admitting: Orthopaedic Surgery

## 2018-02-22 VITALS — BP 120/65 | HR 72 | Ht 66.0 in | Wt 193.0 lb

## 2018-02-22 DIAGNOSIS — M1712 Unilateral primary osteoarthritis, left knee: Secondary | ICD-10-CM

## 2018-02-22 MED ORDER — METHYLPREDNISOLONE ACETATE 40 MG/ML IJ SUSP
80.0000 mg | INTRAMUSCULAR | Status: AC | PRN
  Administered 2018-02-22: 80 mg

## 2018-02-22 MED ORDER — LIDOCAINE HCL 1 % IJ SOLN
2.0000 mL | INTRAMUSCULAR | Status: AC | PRN
  Administered 2018-02-22: 2 mL

## 2018-02-22 MED ORDER — BUPIVACAINE HCL 0.5 % IJ SOLN
2.0000 mL | INTRAMUSCULAR | Status: AC | PRN
  Administered 2018-02-22: 2 mL via INTRA_ARTICULAR

## 2018-02-22 NOTE — Progress Notes (Signed)
Office Visit Note   Patient: Grace Howard           Date of Birth: [birth year]           MRN: [id number] Visit Date: 02/22/2018              Requested by: Ignatius Specking, MD [address] [place], [place] [address] PCP: Ignatius Specking, MD   Assessment & Plan: Visit Diagnoses:  1. Unilateral primary osteoarthritis, left knee     Plan: Repeat cortisone injection left knee.  Follow-up as needed  Follow-Up Instructions: Return if symptoms worsen or fail to improve.   Orders:  Orders Placed This Encounter  Procedures  . Large Joint Inj: L knee   No orders of the defined types were placed in this encounter.     Procedures: Large Joint Inj: L knee on 02/22/2018 11:45 AM Indications: pain and diagnostic evaluation Details: 25 G 1.5 in needle, anteromedial approach  Arthrogram: No  Medications: 2 mL lidocaine 1 %; 2 mL bupivacaine 0.5 %; 80 mg methylPREDNISolone acetate 40 MG/ML Procedure, treatment alternatives, risks and benefits explained, specific risks discussed. Consent was given by the patient. Patient was prepped and draped in the usual sterile fashion.       Clinical Data: No additional findings.   Subjective: Chief Complaint  Patient presents with  . Left Knee - Pain  . Left Hip - Pain  . Follow-up    L KNEE PAIN FOR FEW YRS WORSE OVER6 WKS  Grace Howard has been seen on occasion for evaluation of the osteoarthritis of her left knee.  She has had cortisone in the past that has been "helpful".  She recently had an exacerbation of her pain and wishes to have another cortisone injection.  No recent injury or trauma fever or chills  HPI  Review of Systems  Constitutional: Positive for fatigue.  HENT: Negative for ear pain.   Eyes: Negative for pain.  Respiratory: Positive for shortness of breath.   Cardiovascular: Negative for leg swelling.  Gastrointestinal: Positive for constipation. Negative for diarrhea.  Genitourinary: Negative for difficulty urinating.    Musculoskeletal: Positive for back pain. Negative for neck pain.  Skin: Negative for rash.  Allergic/Immunologic: Negative for food allergies.  Neurological: Positive for weakness. Negative for numbness.  Hematological: Bruises/bleeds easily.  Psychiatric/Behavioral: Negative for sleep disturbance.     Objective: Vital Signs: BP 120/65 (BP Location: Right Arm, Patient Position: Sitting, Cuff Size: Normal)   Pulse 72   Ht 5\' 6"  (1.676 m)   Wt 193 lb (87.5 kg)   BMI 31.15 kg/m   Physical Exam  Constitutional: She is oriented to person, place, and time. She appears well-developed and well-nourished.  HENT:  Mouth/Throat: Oropharynx is clear and moist.  Eyes: Pupils are equal, round, and reactive to light. EOM are normal.  Pulmonary/Chest: Effort normal.  Neurological: She is alert and oriented to person, place, and time.  Skin: Skin is warm and dry.  Psychiatric: She has a normal mood and affect. Her behavior is normal.    Ortho Exam awake alert and oriented x3 comfortable sitting.  Left knee with slight increased varus with weightbearing.  Some patellar crepitation.  No obvious effusion.  Flexed over 105 degrees without instability.  No calf pain.  Pulses intact.  Straight leg raise negative.  No pain with range of motion of either hip  Specialty Comments:  No specialty comments available.  Imaging: No results found.   PMFS History: Patient Active  Problem List   Diagnosis Date Noted  . Unilateral primary osteoarthritis, left knee 02/22/2018  . Insomnia secondary to depression with anxiety 12/06/2012  . Chronic pain syndrome 10/16/2012  . Unspecified vitamin D deficiency 10/16/2012  . MDD (major depressive disorder) 02/15/2012   Past Medical History:  Diagnosis Date  . Anxiety   . Depression   . GERD (gastroesophageal reflux disease)   . Headache(784.0)   . HTN (hypertension)   . Hyperlipidemia   . Insomnia   . Lupus (HCC)   . Macular degeneration June 2013  .  Osteoarthritis June 2013  . Osteoporosis   . PE (pulmonary embolism) 2008   not sure why  . Seizures (HCC)   . Wears glasses     Family History  Problem Relation Age of Onset  . Dementia Father   . ADD / ADHD Other   . ADD / ADHD Other   . Healthy Son   . Healthy Son   . Depression Sister   . Anxiety disorder Sister   . Sexual abuse Sister   . Alcohol abuse Paternal Aunt   . Drug abuse Paternal Uncle   . Bipolar disorder Neg Hx   . OCD Neg Hx   . Paranoid behavior Neg Hx   . Schizophrenia Neg Hx   . Seizures Neg Hx   . Physical abuse Neg Hx     Past Surgical History:  Procedure Laterality Date  . abdomnial surgery    . ANKLE FRACTURE SURGERY  2000   right  . APPENDECTOMY    . CARDIAC CATHETERIZATION  2008   PE-filter in  . COLONOSCOPY    . DILITATION & CURRETTAGE/HYSTROSCOPY WITH ESSURE    . EYE SURGERY     both cataracts  . HARDWARE REMOVAL Left 02/26/2014   Procedure: REMOVAL HARDWARE ;  Surgeon: Tami Ribas, MD;  Location: [facility];  Service: Orthopedics;  Laterality: Left;  . TONSILLECTOMY    . WRIST SURGERY  2011   may-lt   Social History   Occupational History  . Not on file  Tobacco Use  . Smoking status: Former Smoker    Last attempt to quit: 02/19/1978    Years since quitting: 40.0  . Smokeless tobacco: Never Used  Substance and Sexual Activity  . Alcohol use: No  . Drug use: No  . Sexual activity: Never

## 2018-03-06 ENCOUNTER — Other Ambulatory Visit (INDEPENDENT_AMBULATORY_CARE_PROVIDER_SITE_OTHER): Payer: Self-pay | Admitting: Orthopaedic Surgery

## 2018-03-07 NOTE — Telephone Encounter (Signed)
Please advise 

## 2018-03-07 NOTE — Telephone Encounter (Signed)
Ok to refill 

## 2018-03-08 NOTE — Telephone Encounter (Signed)
Ok to refill 

## 2018-03-15 ENCOUNTER — Ambulatory Visit (INDEPENDENT_AMBULATORY_CARE_PROVIDER_SITE_OTHER): Payer: Medicare Other | Admitting: Psychiatry

## 2018-03-15 ENCOUNTER — Encounter (HOSPITAL_COMMUNITY): Payer: Self-pay | Admitting: Psychiatry

## 2018-03-15 VITALS — BP 139/81 | HR 92 | Ht 66.0 in | Wt 192.0 lb

## 2018-03-15 DIAGNOSIS — Z811 Family history of alcohol abuse and dependence: Secondary | ICD-10-CM | POA: Diagnosis not present

## 2018-03-15 DIAGNOSIS — Z87891 Personal history of nicotine dependence: Secondary | ICD-10-CM

## 2018-03-15 DIAGNOSIS — R450 Nervousness: Secondary | ICD-10-CM

## 2018-03-15 DIAGNOSIS — F331 Major depressive disorder, recurrent, moderate: Secondary | ICD-10-CM | POA: Diagnosis not present

## 2018-03-15 DIAGNOSIS — Z818 Family history of other mental and behavioral disorders: Secondary | ICD-10-CM

## 2018-03-15 DIAGNOSIS — I2510 Atherosclerotic heart disease of native coronary artery without angina pectoris: Secondary | ICD-10-CM

## 2018-03-15 MED ORDER — DULOXETINE HCL 60 MG PO CPEP: 60.0000 mg | ORAL_CAPSULE | Freq: Two times a day (BID) | ORAL | 3 refills | Status: DC

## 2018-03-15 MED ORDER — METHYLPHENIDATE HCL 10 MG PO TABS: 10.0000 mg | ORAL_TABLET | Freq: Two times a day (BID) | ORAL | 0 refills | Status: DC

## 2018-03-15 MED ORDER — DIAZEPAM 5 MG PO TABS: 5.0000 mg | ORAL_TABLET | Freq: Three times a day (TID) | ORAL | 2 refills | Status: DC | PRN

## 2018-03-15 MED ORDER — ARIPIPRAZOLE 10 MG PO TABS: 15.0000 mg | ORAL_TABLET | Freq: Every day | ORAL | 3 refills | Status: DC

## 2018-03-15 NOTE — Progress Notes (Signed)
BH MD/PA/NP OP Progress Note  03/15/2018 1:45 PM Grace Howard  MRN:  [id number]  Chief Complaint:  Chief Complaint    Depression; Anxiety; Follow-up     HPI: This patient is a 82 year oldfemale who lives with her [place]in [place]. She has a sister in [place] and 2 sons who live in [place] in [place]. 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  The patient states that she's had depression for many years. She was in a bad marriage in the 66s and was hospitalized at [facility]. She's been seeing psychiatrist treatment ever since then in the most part she's been fairly stable recently. However it she's worried about several health issues. She had a breast cyst which turned out to be benign. She has thyroid nodules but no change in her thyroid hormone. She also has skin cancer in the thickening of her uterine lining. All these things have been getting her worried. She is only on 2 mg of Valium per day and would like an increase because she is very anxious. Her mood is fairly stable but she tends to stay to herself a lot. She denies crying spells panic attacks suicidal ideation her appetite change  The patient returns after 6 weeks.  She states that her husband died rather suddenly last week.  He had been recently diagnosed with pulmonary fibrosis and he had a very aggressive case.  His breathing worsened considerably and he was hospitalized and things went downhill from there.  The patient and her husband's children decided to finally remove life support and he died quickly.  She stated they had the funeral service last weekend.  She still is in shock from all of this is the only married about 7 months.  She states that she is very anxious and forgetful and cannot remember where she is putting things but this is probably due to stress.  Her son is come to stay with her temporarily.  She states she could use a slightly higher dose of Valium because of the anxiety.  She  is eating okay and sleep is variable.  She denies suicidal ideation and seems determined to keep going. Visit Diagnosis:    ICD-10-CM   1. Major depressive disorder, recurrent episode, moderate (HCC) F33.1     Past Psychiatric History: The patient has had depressive symptoms since 1976.  She has had 2 previous admissions in the 80s for depression.  She denies any history of psychosis.  She has had long-term outpatient treatment for depression  Past Medical History:  Past Medical History:  Diagnosis Date  . Anxiety   . Depression   . GERD (gastroesophageal reflux disease)   . Headache(784.0)   . HTN (hypertension)   . Hyperlipidemia   . Insomnia   . Lupus (HCC)   . Macular degeneration June 2013  . Osteoarthritis June 2013  . Osteoporosis   . PE (pulmonary embolism) 2008   not sure why  . Seizures (HCC)   . Wears glasses     Past Surgical History:  Procedure Laterality Date  . abdomnial surgery    . ANKLE FRACTURE SURGERY  2000   right  . APPENDECTOMY    . CARDIAC CATHETERIZATION  2008   PE-filter in  . COLONOSCOPY    . DILITATION & CURRETTAGE/HYSTROSCOPY WITH ESSURE    . EYE SURGERY     both cataracts  . HARDWARE REMOVAL Left 02/26/2014   Procedure: REMOVAL HARDWARE ;  Surgeon: Tami Ribas,  MD;  Location: [facility];  Service: Orthopedics;  Laterality: Left;  . TONSILLECTOMY    . WRIST SURGERY  2011   may-lt    Family Psychiatric History: See below  Family History:  Family History  Problem Relation Age of Onset  . Dementia Father   . ADD / ADHD Other   . ADD / ADHD Other   . Healthy Son   . Healthy Son   . Depression Sister   . Anxiety disorder Sister   . Sexual abuse Sister   . Alcohol abuse Paternal Aunt   . Drug abuse Paternal Uncle   . Bipolar disorder Neg Hx   . OCD Neg Hx   . Paranoid behavior Neg Hx   . Schizophrenia Neg Hx   . Seizures Neg Hx   . Physical abuse Neg Hx     Social History:  Social History   Socioeconomic  History  . Marital status: Widowed    Spouse name: Not on file  . Number of children: Not on file  . Years of education: Not on file  . Highest education level: Not on file  Occupational History  . Not on file  Social Needs  . Financial resource strain: Not on file  . Food insecurity:    Worry: Not on file    Inability: Not on file  . Transportation needs:    Medical: Not on file    Non-medical: Not on file  Tobacco Use  . Smoking status: Former Smoker    Last attempt to quit: 02/19/1978    Years since quitting: 40.0  . Smokeless tobacco: Never Used  Substance and Sexual Activity  . Alcohol use: No  . Drug use: No  . Sexual activity: Never  Lifestyle  . Physical activity:    Days per week: Not on file    Minutes per session: Not on file  . Stress: Not on file  Relationships  . Social connections:    Talks on phone: Not on file    Gets together: Not on file    Attends religious service: Not on file    Active member of club or organization: Not on file    Attends meetings of clubs or organizations: Not on file    Relationship status: Not on file  Other Topics Concern  . Not on file  Social History Narrative  . Not on file    Allergies:  Allergies  Allergen Reactions  . Tegretol [Carbamazepine] Other (See Comments)    Thought to have caused her Lupus    Metabolic Disorder Labs: Lab Results  Component Value Date   HGBA1C 5.7 (H) 02/15/2012   MPG 117 (H) 02/15/2012   No results found for: PROLACTIN No results found for: CHOL, TRIG, HDL, CHOLHDL, VLDL, LDLCALC No results found for: TSH  Therapeutic Level Labs: No results found for: LITHIUM No results found for: VALPROATE No components found for:  CBMZ  Current Medications: Current Outpatient Medications  Medication Sig Dispense Refill  . amLODipine (NORVASC) 5 MG tablet Take 5 mg by mouth daily.    . ARIPiprazole (ABILIFY) 10 MG tablet Take 1.5 tablets (15 mg total) by mouth daily. 45 tablet 3  .  benazepril (LOTENSIN) 20 MG tablet     . Calcium Carbonate-Vitamin D (CALCIUM + D PO) Take by mouth 2 (two) times daily.    . celecoxib (CELEBREX) 100 MG capsule TAKE ONE CAPSULE BY MOUTH TWICE DAILY 60 capsule 1  . diclofenac sodium (VOLTAREN)  1 % GEL APPLY 3GM TWICE DAILY  2  . DULoxetine (CYMBALTA) 60 MG capsule Take 1 capsule (60 mg total) by mouth 2 (two) times daily. 60 capsule 3  . hydroxychloroquine (PLAQUENIL) 200 MG tablet Take 300 mg by mouth daily.     Marland Kitchen levETIRAcetam (KEPPRA) 500 MG tablet Take 500 mg by mouth daily.     . methylphenidate (RITALIN) 10 MG tablet Take 1 tablet (10 mg total) by mouth 2 (two) times daily with breakfast and lunch. 60 tablet 0  . methylphenidate (RITALIN) 10 MG tablet Take 1 tablet (10 mg total) by mouth 2 (two) times daily with breakfast and lunch. 60 tablet 0  . metoprolol tartrate (LOPRESSOR) 25 MG tablet Take 25 mg by mouth 2 (two) times daily.    . Multiple Vitamin (MULTIVITAMIN) capsule Take 1 capsule by mouth daily.    . Multiple Vitamins-Minerals (HM COMPLETE 50+ MENS ULTIMATE PO) TAKE ONE TABLET BY MOUTH DAILY  12  . Multiple Vitamins-Minerals (PRESERVISION AREDS) TABS Take 1 tablet by mouth daily.  12  . omega-3 acid ethyl esters (LOVAZA) 1 G capsule Take 2 g by mouth daily.    . pantoprazole (PROTONIX) 40 MG tablet Take 40 mg by mouth daily.    . pravastatin (PRAVACHOL) 80 MG tablet Take 80 mg by mouth daily.    Marland Kitchen tiZANidine (ZANAFLEX) 2 MG tablet Take 2 mg by mouth 2 (two) times daily as needed for muscle spasms.    . traMADol (ULTRAM) 50 MG tablet     . warfarin (COUMADIN) 2 MG tablet Take by mouth as directed.     . diazepam (VALIUM) 5 MG tablet Take 1 tablet (5 mg total) by mouth 3 (three) times daily as needed for anxiety. 90 tablet 2  . methylphenidate (RITALIN) 10 MG tablet Take 1 tablet (10 mg total) by mouth 2 (two) times daily with breakfast and lunch. 60 tablet 0   No current facility-administered medications for this visit.       Musculoskeletal: Strength & Muscle Tone: Decreased Gait & Station: Unsteady, walks with a cane Patient leans: N/A  Psychiatric Specialty Exam: Review of Systems  Musculoskeletal: Positive for back pain and joint pain.  Psychiatric/Behavioral: Positive for depression. The patient is nervous/anxious.   All other systems reviewed and are negative.   Blood pressure 139/81, pulse 92, height 5\' 6"  (1.676 m), weight 192 lb (87.1 kg), SpO2 93 %.Body mass index is 30.99 kg/m.  General Appearance: Casual, Neat and Well Groomed  Eye Contact:  Good  Speech:  Clear and Coherent  Volume:  Normal  Mood:  Anxious  Affect:  Congruent  Thought Process:  Goal Directed  Orientation:  Full (Time, Place, and Person)  Thought Content: Rumination   Suicidal Thoughts:  no  Homicidal Thoughts:  No  Memory:  Immediate;   Good Recent;   Good Remote;   Fair  Judgement:  Good  Insight:  Good  Psychomotor Activity:  Decreased  Concentration:  Concentration: Fair and Attention Span: Fair  Recall:  [employer] of Knowledge: Good  Language: Good  Akathisia:  No  Handed:  Right  AIMS (if indicated): not done  Assets:  Communication Skills Desire for Improvement Resilience Social Support Talents/Skills  ADL's:  Intact  Cognition: WNL  Sleep:  Fair   Screenings:   Assessment and Plan: Patient is a 82 year old female with a history of depression anxiety and difficulty focusing.  She is just gone through a major life change with her  husband's sudden death.  Right now she is basically just trying to cope.  For now she will continue Abilify 15 mg daily for mood stabilization, Cymbalta 60 mg twice daily for depression methylphenidate 10 mg twice daily for focus.  Her Valium will be increased from 2 to 5 mg 3 times daily for anxiety.  She will return to see me in 4 weeks.   Diannia Ruder, MD 03/15/2018, 1:45 PM

## 2018-03-30 DIAGNOSIS — M79605 Pain in left leg: Secondary | ICD-10-CM | POA: Diagnosis not present

## 2018-03-30 DIAGNOSIS — I2699 Other pulmonary embolism without acute cor pulmonale: Secondary | ICD-10-CM | POA: Diagnosis not present

## 2018-03-30 DIAGNOSIS — Z713 Dietary counseling and surveillance: Secondary | ICD-10-CM | POA: Diagnosis not present

## 2018-03-30 DIAGNOSIS — Z299 Encounter for prophylactic measures, unspecified: Secondary | ICD-10-CM | POA: Diagnosis not present

## 2018-03-30 DIAGNOSIS — Z6828 Body mass index (BMI) 28.0-28.9, adult: Secondary | ICD-10-CM | POA: Diagnosis not present

## 2018-04-05 DIAGNOSIS — I2510 Atherosclerotic heart disease of native coronary artery without angina pectoris: Secondary | ICD-10-CM | POA: Diagnosis not present

## 2018-04-05 DIAGNOSIS — I4891 Unspecified atrial fibrillation: Secondary | ICD-10-CM | POA: Diagnosis not present

## 2018-04-05 DIAGNOSIS — I10 Essential (primary) hypertension: Secondary | ICD-10-CM | POA: Diagnosis not present

## 2018-04-05 DIAGNOSIS — M159 Polyosteoarthritis, unspecified: Secondary | ICD-10-CM | POA: Diagnosis not present

## 2018-04-17 DIAGNOSIS — E669 Obesity, unspecified: Secondary | ICD-10-CM | POA: Diagnosis not present

## 2018-04-17 DIAGNOSIS — I10 Essential (primary) hypertension: Secondary | ICD-10-CM | POA: Diagnosis not present

## 2018-04-17 DIAGNOSIS — J301 Allergic rhinitis due to pollen: Secondary | ICD-10-CM | POA: Diagnosis not present

## 2018-04-17 DIAGNOSIS — J449 Chronic obstructive pulmonary disease, unspecified: Secondary | ICD-10-CM | POA: Diagnosis not present

## 2018-04-19 ENCOUNTER — Ambulatory Visit (INDEPENDENT_AMBULATORY_CARE_PROVIDER_SITE_OTHER): Payer: Medicare Other | Admitting: Psychiatry

## 2018-04-19 ENCOUNTER — Encounter (HOSPITAL_COMMUNITY): Payer: Self-pay | Admitting: Psychiatry

## 2018-04-19 VITALS — BP 124/78 | HR 89 | Ht 66.0 in | Wt 190.8 lb

## 2018-04-19 DIAGNOSIS — I2510 Atherosclerotic heart disease of native coronary artery without angina pectoris: Secondary | ICD-10-CM

## 2018-04-19 DIAGNOSIS — F331 Major depressive disorder, recurrent, moderate: Secondary | ICD-10-CM | POA: Diagnosis not present

## 2018-04-19 DIAGNOSIS — Z634 Disappearance and death of family member: Secondary | ICD-10-CM | POA: Diagnosis not present

## 2018-04-19 DIAGNOSIS — Z818 Family history of other mental and behavioral disorders: Secondary | ICD-10-CM | POA: Diagnosis not present

## 2018-04-19 DIAGNOSIS — Z87891 Personal history of nicotine dependence: Secondary | ICD-10-CM | POA: Diagnosis not present

## 2018-04-19 DIAGNOSIS — Z79899 Other long term (current) drug therapy: Secondary | ICD-10-CM | POA: Diagnosis not present

## 2018-04-19 MED ORDER — DULOXETINE HCL 60 MG PO CPEP: 60.0000 mg | ORAL_CAPSULE | Freq: Two times a day (BID) | ORAL | 3 refills | Status: DC

## 2018-04-19 MED ORDER — METHYLPHENIDATE HCL 10 MG PO TABS: 10.0000 mg | ORAL_TABLET | Freq: Two times a day (BID) | ORAL | 0 refills | Status: DC

## 2018-04-19 MED ORDER — ARIPIPRAZOLE 10 MG PO TABS: 15.0000 mg | ORAL_TABLET | Freq: Every day | ORAL | 3 refills | Status: DC

## 2018-04-19 MED ORDER — DIAZEPAM 5 MG PO TABS: 5.0000 mg | ORAL_TABLET | Freq: Three times a day (TID) | ORAL | 2 refills | Status: DC | PRN
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
## 2018-04-25 DIAGNOSIS — M79605 Pain in left leg: Secondary | ICD-10-CM | POA: Diagnosis not present

## 2018-04-25 DIAGNOSIS — R2689 Other abnormalities of gait and mobility: Secondary | ICD-10-CM | POA: Diagnosis not present

## 2018-04-25 DIAGNOSIS — M6281 Muscle weakness (generalized): Secondary | ICD-10-CM | POA: Diagnosis not present

## 2018-04-28 DIAGNOSIS — M79605 Pain in left leg: Secondary | ICD-10-CM | POA: Diagnosis not present

## 2018-04-28 DIAGNOSIS — M6281 Muscle weakness (generalized): Secondary | ICD-10-CM | POA: Diagnosis not present

## 2018-04-28 DIAGNOSIS — R2689 Other abnormalities of gait and mobility: Secondary | ICD-10-CM | POA: Diagnosis not present

## 2018-05-05 DIAGNOSIS — M810 Age-related osteoporosis without current pathological fracture: Secondary | ICD-10-CM | POA: Diagnosis not present

## 2018-05-05 DIAGNOSIS — R296 Repeated falls: Secondary | ICD-10-CM | POA: Diagnosis not present

## 2018-05-05 DIAGNOSIS — Z6828 Body mass index (BMI) 28.0-28.9, adult: Secondary | ICD-10-CM | POA: Diagnosis not present

## 2018-05-05 DIAGNOSIS — M1710 Unilateral primary osteoarthritis, unspecified knee: Secondary | ICD-10-CM | POA: Diagnosis not present

## 2018-05-05 DIAGNOSIS — R2689 Other abnormalities of gait and mobility: Secondary | ICD-10-CM | POA: Diagnosis not present

## 2018-05-05 DIAGNOSIS — I2699 Other pulmonary embolism without acute cor pulmonale: Secondary | ICD-10-CM | POA: Diagnosis not present

## 2018-05-05 DIAGNOSIS — Z299 Encounter for prophylactic measures, unspecified: Secondary | ICD-10-CM | POA: Diagnosis not present

## 2018-05-07 ENCOUNTER — Other Ambulatory Visit (INDEPENDENT_AMBULATORY_CARE_PROVIDER_SITE_OTHER): Payer: Self-pay | Admitting: Orthopaedic Surgery

## 2018-05-09 ENCOUNTER — Telehealth (INDEPENDENT_AMBULATORY_CARE_PROVIDER_SITE_OTHER): Payer: Self-pay | Admitting: Orthopaedic Surgery

## 2018-05-09 NOTE — Telephone Encounter (Signed)
Left message to follow up with MD to have lab work done to check kidney functions before they refill it

## 2018-05-09 NOTE — Telephone Encounter (Signed)
Please advise if I can refill

## 2018-05-09 NOTE — Telephone Encounter (Signed)
Needs lab work to check kidney function-check with primary care MD-before renewing

## 2018-05-09 NOTE — Telephone Encounter (Signed)
error 

## 2018-05-17 ENCOUNTER — Ambulatory Visit (INDEPENDENT_AMBULATORY_CARE_PROVIDER_SITE_OTHER): Payer: Medicare Other

## 2018-05-17 ENCOUNTER — Encounter (INDEPENDENT_AMBULATORY_CARE_PROVIDER_SITE_OTHER): Payer: Self-pay | Admitting: Orthopaedic Surgery

## 2018-05-17 ENCOUNTER — Ambulatory Visit (INDEPENDENT_AMBULATORY_CARE_PROVIDER_SITE_OTHER): Payer: Medicare Other | Admitting: Orthopaedic Surgery

## 2018-05-17 VITALS — BP 102/62 | HR 79 | Ht 66.0 in | Wt 193.0 lb

## 2018-05-17 DIAGNOSIS — M545 Low back pain: Secondary | ICD-10-CM

## 2018-05-17 DIAGNOSIS — I2510 Atherosclerotic heart disease of native coronary artery without angina pectoris: Secondary | ICD-10-CM | POA: Diagnosis not present

## 2018-05-17 DIAGNOSIS — G8929 Other chronic pain: Secondary | ICD-10-CM | POA: Diagnosis not present

## 2018-05-17 DIAGNOSIS — M25562 Pain in left knee: Secondary | ICD-10-CM

## 2018-05-17 NOTE — Progress Notes (Signed)
Office Visit Note   Patient: Grace Howard           Date of Birth: 10-Oct-1935           MRN: [id number] Visit Date: 05/17/2018              Requested by: Ignatius Specking, MD [address] [place], [place] [address] PCP: Ignatius Specking, MD   Assessment & Plan: Visit Diagnoses:  1. Chronic pain of left knee   2. Chronic midline low back pain, with sciatica presence unspecified     Plan: Long discussion regarding left knee osteoarthritis which appears to be causing her most of her problem.  We will try the bracing to help support her knee and continue with a walker.  Her weakness is a problem.  Some of her medicines may be a problem with her self administering.  Hold off on further cortisone injection.  Not having any hip or pelvic pain.  Office 1 month.  Concerned about her being on her own at home voiced to her and her caregiver.Spider brace applied and comfortabless  Follow-Up Instructions: No follow-ups on file.   Orders:  Orders Placed This Encounter  Procedures  . XR KNEE 3 VIEW LEFT  . XR Pelvis 1-2 Views   No orders of the defined types were placed in this encounter.     Procedures: No procedures performed   Clinical Data: No additional findings.   Subjective: No chief complaint on file. Grace Howard is 82 years old accompanied by her caregiver here for follow-up evaluation of problems she is having with her left knee.  She is had a chronic problem with her knee with x-rays demonstrating some degenerative changes.  I injected her knee approximately 3 months ago with questionable results.  She does use a walker.  Her caregiver relates that he just seems to be generally weak and she has been evaluated by her primary care physician.  She was found on the floor this morning. she had fallen apparently tripping over her feet possibly related to a weakened left knee. Presently taking multiple medicines for multiple medical problems including major depression.  Her meds include Ritalin,  Cymbalta, Valium, Keppra and tramadol. Her caregiver is concerned that she is taking a lot of medicines at home without telling anybody and causing her to be weak and with slurred speech. HPI  Review of Systems  Constitutional: Positive for fatigue.  HENT: Negative for ear pain.   Eyes: Negative for pain.  Respiratory: Negative for cough and shortness of breath.   Cardiovascular: Positive for leg swelling.  Gastrointestinal: Negative for constipation and diarrhea.  Genitourinary: Negative for difficulty urinating.  Musculoskeletal: Negative for back pain and neck pain.  Skin: Negative for rash.  Allergic/Immunologic: Negative for food allergies.  Neurological: Positive for weakness. Negative for numbness.  Hematological: Does not bruise/bleed easily.  Psychiatric/Behavioral: Positive for sleep disturbance.     Objective: Vital Signs: Ht 5\' 6"  (1.676 m)   Wt 193 lb (87.5 kg)   BMI 31.15 kg/m   Physical Exam  Constitutional: She is oriented to person, place, and time. She appears well-developed.  HENT:  Head: Normocephalic.  Eyes: Pupils are equal, round, and reactive to light.  Pulmonary/Chest: Effort normal and breath sounds normal.  Neurological: She is oriented to person, place, and time.  Skin: Skin is warm.    Ortho Exam Grace. Grima seems obtunded with slightly slurred speech.  Motions are slow.  Walks with a rolling  walker.  Having pain in multiple areas about her left knee.  Left knee was not hot red warm or swollen.  No obvious effusion.  No instability.  Full extension and flexion over 100 degrees.  +1 pulses.  Good capillary refill to toes.  Some decreased motion of her left hip.  Straight leg raise negative  Specialty Comments:  No specialty comments available.  Imaging: No results found.   PMFS History: Patient Active Problem List   Diagnosis Date Noted  . Unilateral primary osteoarthritis, left knee 02/22/2018  . Insomnia secondary to depression with  anxiety 12/06/2012  . Chronic pain syndrome 10/16/2012  . Unspecified vitamin D deficiency 10/16/2012  . MDD (major depressive disorder) 02/15/2012   Past Medical History:  Diagnosis Date  . Anxiety   . Depression   . GERD (gastroesophageal reflux disease)   . Headache(784.0)   . HTN (hypertension)   . Hyperlipidemia   . Insomnia   . Lupus (HCC)   . Macular degeneration June 2013  . Osteoarthritis June 2013  . Osteoporosis   . PE (pulmonary embolism) 2008   not sure why  . Seizures (HCC)   . Wears glasses     Family History  Problem Relation Age of Onset  . Dementia Father   . ADD / ADHD Other   . ADD / ADHD Other   . Healthy Son   . Healthy Son   . Depression Sister   . Anxiety disorder Sister   . Sexual abuse Sister   . Alcohol abuse Paternal Aunt   . Drug abuse Paternal Uncle   . Bipolar disorder Neg Hx   . OCD Neg Hx   . Paranoid behavior Neg Hx   . Schizophrenia Neg Hx   . Seizures Neg Hx   . Physical abuse Neg Hx     Past Surgical History:  Procedure Laterality Date  . abdomnial surgery    . ANKLE FRACTURE SURGERY  2000   right  . APPENDECTOMY    . CARDIAC CATHETERIZATION  2008   PE-filter in  . COLONOSCOPY    . DILITATION & CURRETTAGE/HYSTROSCOPY WITH ESSURE    . EYE SURGERY     both cataracts  . HARDWARE REMOVAL Left 02/26/2014   Procedure: REMOVAL HARDWARE ;  Surgeon: Tami Ribas, MD;  Location: [facility];  Service: Orthopedics;  Laterality: Left;  . TONSILLECTOMY    . WRIST SURGERY  2011   may-lt   Social History   Occupational History  . Not on file  Tobacco Use  . Smoking status: Former Smoker    Last attempt to quit: 02/19/1978    Years since quitting: 40.2  . Smokeless tobacco: Never Used  Substance and Sexual Activity  . Alcohol use: No  . Drug use: No  . Sexual activity: Never

## 2018-05-19 ENCOUNTER — Other Ambulatory Visit (INDEPENDENT_AMBULATORY_CARE_PROVIDER_SITE_OTHER): Payer: Self-pay | Admitting: Orthopaedic Surgery

## 2018-05-19 DIAGNOSIS — Z299 Encounter for prophylactic measures, unspecified: Secondary | ICD-10-CM | POA: Diagnosis not present

## 2018-05-19 DIAGNOSIS — I10 Essential (primary) hypertension: Secondary | ICD-10-CM | POA: Diagnosis not present

## 2018-05-19 DIAGNOSIS — Z6835 Body mass index (BMI) 35.0-35.9, adult: Secondary | ICD-10-CM | POA: Diagnosis not present

## 2018-05-19 DIAGNOSIS — I4891 Unspecified atrial fibrillation: Secondary | ICD-10-CM | POA: Diagnosis not present

## 2018-05-19 DIAGNOSIS — E1165 Type 2 diabetes mellitus with hyperglycemia: Secondary | ICD-10-CM | POA: Diagnosis not present

## 2018-05-19 DIAGNOSIS — M329 Systemic lupus erythematosus, unspecified: Secondary | ICD-10-CM | POA: Diagnosis not present

## 2018-05-19 NOTE — Telephone Encounter (Signed)
Can we refill? 

## 2018-05-23 NOTE — Telephone Encounter (Signed)
Please have Dr Sherril Croon do the Rx.  Apparently may have some med abuse according to last note

## 2018-05-23 NOTE — Telephone Encounter (Signed)
Can we refill? 

## 2018-05-29 DIAGNOSIS — H353132 Nonexudative age-related macular degeneration, bilateral, intermediate dry stage: Secondary | ICD-10-CM | POA: Diagnosis not present

## 2018-05-29 DIAGNOSIS — Z961 Presence of intraocular lens: Secondary | ICD-10-CM | POA: Diagnosis not present

## 2018-05-30 DIAGNOSIS — R220 Localized swelling, mass and lump, head: Secondary | ICD-10-CM | POA: Diagnosis not present

## 2018-05-30 DIAGNOSIS — K047 Periapical abscess without sinus: Secondary | ICD-10-CM | POA: Diagnosis not present

## 2018-05-31 ENCOUNTER — Telehealth (HOSPITAL_COMMUNITY): Payer: Self-pay | Admitting: *Deleted

## 2018-05-31 NOTE — Telephone Encounter (Signed)
Patient called requesting refill on Diazepam. Called Rx 2 refills on hold. Ask for 1 to be filled & then called to notify patient

## 2018-06-02 DIAGNOSIS — M25562 Pain in left knee: Secondary | ICD-10-CM | POA: Diagnosis not present

## 2018-06-02 DIAGNOSIS — Z299 Encounter for prophylactic measures, unspecified: Secondary | ICD-10-CM | POA: Diagnosis not present

## 2018-06-02 DIAGNOSIS — I10 Essential (primary) hypertension: Secondary | ICD-10-CM | POA: Diagnosis not present

## 2018-06-02 DIAGNOSIS — Z6828 Body mass index (BMI) 28.0-28.9, adult: Secondary | ICD-10-CM | POA: Diagnosis not present

## 2018-06-02 DIAGNOSIS — I2699 Other pulmonary embolism without acute cor pulmonale: Secondary | ICD-10-CM | POA: Diagnosis not present

## 2018-06-02 DIAGNOSIS — Z23 Encounter for immunization: Secondary | ICD-10-CM | POA: Diagnosis not present

## 2018-06-02 DIAGNOSIS — M1710 Unilateral primary osteoarthritis, unspecified knee: Secondary | ICD-10-CM | POA: Diagnosis not present

## 2018-06-05 DIAGNOSIS — I4891 Unspecified atrial fibrillation: Secondary | ICD-10-CM | POA: Diagnosis not present

## 2018-06-05 DIAGNOSIS — M25561 Pain in right knee: Secondary | ICD-10-CM | POA: Diagnosis not present

## 2018-06-05 DIAGNOSIS — M159 Polyosteoarthritis, unspecified: Secondary | ICD-10-CM | POA: Diagnosis not present

## 2018-06-05 DIAGNOSIS — M25562 Pain in left knee: Secondary | ICD-10-CM | POA: Diagnosis not present

## 2018-06-05 DIAGNOSIS — I10 Essential (primary) hypertension: Secondary | ICD-10-CM | POA: Diagnosis not present

## 2018-06-05 DIAGNOSIS — I2510 Atherosclerotic heart disease of native coronary artery without angina pectoris: Secondary | ICD-10-CM | POA: Diagnosis not present

## 2018-06-06 DIAGNOSIS — M329 Systemic lupus erythematosus, unspecified: Secondary | ICD-10-CM | POA: Diagnosis not present

## 2018-06-06 DIAGNOSIS — Z299 Encounter for prophylactic measures, unspecified: Secondary | ICD-10-CM | POA: Diagnosis not present

## 2018-06-06 DIAGNOSIS — I2699 Other pulmonary embolism without acute cor pulmonale: Secondary | ICD-10-CM | POA: Diagnosis not present

## 2018-06-06 DIAGNOSIS — E1165 Type 2 diabetes mellitus with hyperglycemia: Secondary | ICD-10-CM | POA: Diagnosis not present

## 2018-06-06 DIAGNOSIS — I10 Essential (primary) hypertension: Secondary | ICD-10-CM | POA: Diagnosis not present

## 2018-06-06 DIAGNOSIS — Z6828 Body mass index (BMI) 28.0-28.9, adult: Secondary | ICD-10-CM | POA: Diagnosis not present

## 2018-06-06 DIAGNOSIS — M1710 Unilateral primary osteoarthritis, unspecified knee: Secondary | ICD-10-CM | POA: Diagnosis not present

## 2018-06-19 ENCOUNTER — Ambulatory Visit (HOSPITAL_COMMUNITY): Payer: Self-pay | Admitting: Psychiatry

## 2018-06-20 ENCOUNTER — Other Ambulatory Visit (INDEPENDENT_AMBULATORY_CARE_PROVIDER_SITE_OTHER): Payer: Self-pay | Admitting: Orthopaedic Surgery

## 2018-06-20 NOTE — Telephone Encounter (Signed)
Please advise if we can refill 

## 2018-06-20 NOTE — Telephone Encounter (Signed)
Ok-have her chreck with her primarycare MD re renal function while on this med

## 2018-06-20 NOTE — Telephone Encounter (Signed)
Notified pt to follow up with pcp to have renal kidney function labs preformed to continue meds.

## 2018-06-22 ENCOUNTER — Encounter (HOSPITAL_COMMUNITY): Payer: Self-pay | Admitting: Psychiatry

## 2018-06-22 ENCOUNTER — Ambulatory Visit (INDEPENDENT_AMBULATORY_CARE_PROVIDER_SITE_OTHER): Payer: Medicare Other | Admitting: Psychiatry

## 2018-06-22 VITALS — BP 132/83 | HR 96 | Ht 66.0 in | Wt 182.0 lb

## 2018-06-22 DIAGNOSIS — I2510 Atherosclerotic heart disease of native coronary artery without angina pectoris: Secondary | ICD-10-CM | POA: Diagnosis not present

## 2018-06-22 DIAGNOSIS — Z87891 Personal history of nicotine dependence: Secondary | ICD-10-CM | POA: Diagnosis not present

## 2018-06-22 DIAGNOSIS — F419 Anxiety disorder, unspecified: Secondary | ICD-10-CM | POA: Diagnosis not present

## 2018-06-22 DIAGNOSIS — F331 Major depressive disorder, recurrent, moderate: Secondary | ICD-10-CM

## 2018-06-22 MED ORDER — DIAZEPAM 2 MG PO TABS: 2.0000 mg | ORAL_TABLET | Freq: Three times a day (TID) | ORAL | 2 refills | Status: DC | PRN

## 2018-06-22 MED ORDER — DULOXETINE HCL 60 MG PO CPEP: 60.0000 mg | ORAL_CAPSULE | Freq: Two times a day (BID) | ORAL | 3 refills | Status: DC

## 2018-06-22 MED ORDER — ARIPIPRAZOLE 10 MG PO TABS: 15.0000 mg | ORAL_TABLET | Freq: Every day | ORAL | 3 refills | Status: DC

## 2018-06-22 NOTE — Patient Instructions (Signed)
Valium has been reduced to 2 mg three times a day as needed Discontinue ritalin

## 2018-06-22 NOTE — Progress Notes (Signed)
BH MD/PA/NP OP Progress Note  06/22/2018 1:54 PM Grace Howard  MRN:  [id number]  Chief Complaint:  Chief Complaint    Depression; Anxiety; Follow-up     HPI: This patient is a 82 year oldfemale who lives [address]. She has a sister in [place] and 2 sons who live in [place] in [place]. 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  The patient states that she's had depression for many years. She was in a bad marriage in the 51s and was hospitalized at [facility]. She's been seeing psychiatrist treatment ever since then in the most part she's been fairly stable recently. However it she's worried about several health issues. She had a breast cyst which turned out to be benign. She has thyroid nodules but no change in her thyroid hormone. She also has skin cancer in the thickening of her uterine lining. All these things have been getting her worried. She is only on 2 mg of Valium per day and would like an increase because she is very anxious. Her mood is fairly stable but she tends to stay to herself a lot. She denies crying spells panic attacks suicidal ideation her appetite change  The patient returns after 2 months.  Her caregiver [name] is here.  [name] is very concerned because she thinks the patient is overmedicated.  She states that she is overusing the Valium and spends all day in bed.  She is noticed decreases in the patient's memory as well as problems with decreased muscle tone and weakness.  The patient herself looks somewhat out of it and is slurring her speech a bit she is much more disheveled than I have ever seen her.  She states that she is quite depressed about her husband's death in 03-23-23 and is been hard to shake.  She states that she has severe pain in her legs and the tramadol is not really helping.  She is upset that [name] is getting more and more involved in her everyday decisions and feels like [name] and her sister have taken over her  life.  Speaking to the patient alone I told her I think it would be judicious to cut down the dosage of Valium.  She does not feel like the methylphenidate has helped so we will stop it.  I will send a note to her primary doctor to see if we can find another way to help her with pain.  Her mobility is very limited and this is causing more depression.  She denies any suicidal ideation.  I have urged her to at least try to get out of bed every day. Visit Diagnosis:      ICD-10-CM   1. Major depressive disorder, recurrent episode, moderate (HCC) F33.1     Past Psychiatric History: Patient has had depressive symptoms since 1976.  She has had 2 previous admissions in the 80s for depression.  She denies any history of psychosis.  Past Medical History:  Past Medical History:  Diagnosis Date  . Anxiety   . Depression   . GERD (gastroesophageal reflux disease)   . Headache(784.0)   . HTN (hypertension)   . Hyperlipidemia   . Insomnia   . Lupus (HCC)   . Macular degeneration 03-22-12 . Osteoarthritis Mar 22, 2012 . Osteoporosis   . PE (pulmonary embolism) 2008   not sure why  . Seizures (HCC)   . Wears glasses     Past Surgical History:  Procedure Laterality Date  .  abdomnial surgery    . ANKLE FRACTURE SURGERY  2000   right  . APPENDECTOMY    . CARDIAC CATHETERIZATION  2008   PE-filter in  . COLONOSCOPY    . DILITATION & CURRETTAGE/HYSTROSCOPY WITH ESSURE    . EYE SURGERY     both cataracts  . HARDWARE REMOVAL Left 02/26/2014   Procedure: REMOVAL HARDWARE ;  Surgeon: Tami Ribas, MD;  Location: [facility];  Service: Orthopedics;  Laterality: Left;  . TONSILLECTOMY    . WRIST SURGERY  2011   may-lt    Family Psychiatric History: See below  Family History:  Family History  Problem Relation Age of Onset  . Dementia Father   . ADD / ADHD Other   . ADD / ADHD Other   . Healthy Son   . Healthy Son   . Depression Sister   . Anxiety disorder Sister   .  Sexual abuse Sister   . Alcohol abuse Paternal Aunt   . Drug abuse Paternal Uncle   . Bipolar disorder Neg Hx   . OCD Neg Hx   . Paranoid behavior Neg Hx   . Schizophrenia Neg Hx   . Seizures Neg Hx   . Physical abuse Neg Hx     Social History:  Social History   Socioeconomic History  . Marital status: Widowed    Spouse name: Not on file  . Number of children: Not on file  . Years of education: Not on file  . Highest education level: Not on file  Occupational History  . Not on file  Social Needs  . Financial resource strain: Not on file  . Food insecurity:    Worry: Not on file    Inability: Not on file  . Transportation needs:    Medical: Not on file    Non-medical: Not on file  Tobacco Use  . Smoking status: Former Smoker    Last attempt to quit: 02/19/1978    Years since quitting: 40.3  . Smokeless tobacco: Never Used  Substance and Sexual Activity  . Alcohol use: No  . Drug use: No  . Sexual activity: Never  Lifestyle  . Physical activity:    Days per week: Not on file    Minutes per session: Not on file  . Stress: Not on file  Relationships  . Social connections:    Talks on phone: Not on file    Gets together: Not on file    Attends religious service: Not on file    Active member of club or organization: Not on file    Attends meetings of clubs or organizations: Not on file    Relationship status: Not on file  Other Topics Concern  . Not on file  Social History Narrative  . Not on file    Allergies:  Allergies  Allergen Reactions  . Tegretol [Carbamazepine] Other (See Comments)    Thought to have caused her Lupus    Metabolic Disorder Labs: Lab Results  Component Value Date   HGBA1C 5.7 (H) 02/15/2012   MPG 117 (H) 02/15/2012   No results found for: PROLACTIN No results found for: CHOL, TRIG, HDL, CHOLHDL, VLDL, LDLCALC No results found for: TSH  Therapeutic Level Labs: No results found for: LITHIUM No results found for: VALPROATE No  components found for:  CBMZ  Current Medications: Current Outpatient Medications  Medication Sig Dispense Refill  . amLODipine (NORVASC) 5 MG tablet Take 5 mg by mouth daily.    Marland Kitchen  ARIPiprazole (ABILIFY) 10 MG tablet Take 1.5 tablets (15 mg total) by mouth daily. 45 tablet 3  . benazepril (LOTENSIN) 20 MG tablet     . Calcium Carbonate-Vitamin D (CALCIUM + D PO) Take by mouth 2 (two) times daily.    . celecoxib (CELEBREX) 100 MG capsule TAKE ONE CAPSULE BY MOUTH TWICE DAILY 60 capsule 1  . diazepam (VALIUM) 2 MG tablet Take 1 tablet (2 mg total) by mouth 3 (three) times daily as needed for anxiety. 90 tablet 2  . diazepam (VALIUM) 5 MG tablet Take 1 tablet (5 mg total) by mouth 3 (three) times daily as needed for anxiety. 90 tablet 2  . diclofenac sodium (VOLTAREN) 1 % GEL APPLY 3GM TWICE DAILY  2  . DULoxetine (CYMBALTA) 60 MG capsule Take 1 capsule (60 mg total) by mouth 2 (two) times daily. 60 capsule 3  . hydroxychloroquine (PLAQUENIL) 200 MG tablet Take 300 mg by mouth daily.     Marland Kitchen levETIRAcetam (KEPPRA) 500 MG tablet Take 500 mg by mouth daily.     . metoprolol tartrate (LOPRESSOR) 25 MG tablet Take 25 mg by mouth 2 (two) times daily.    . Multiple Vitamin (MULTIVITAMIN) capsule Take 1 capsule by mouth daily.    . Multiple Vitamins-Minerals (HM COMPLETE 50+ MENS ULTIMATE PO) TAKE ONE TABLET BY MOUTH DAILY  12  . Multiple Vitamins-Minerals (PRESERVISION AREDS) TABS Take 1 tablet by mouth daily.  12  . omega-3 acid ethyl esters (LOVAZA) 1 G capsule Take 2 g by mouth daily.    . pantoprazole (PROTONIX) 40 MG tablet Take 40 mg by mouth daily.    . pravastatin (PRAVACHOL) 80 MG tablet Take 80 mg by mouth daily.    Marland Kitchen tiZANidine (ZANAFLEX) 2 MG tablet Take 2 mg by mouth 2 (two) times daily as needed for muscle spasms.    . traMADol (ULTRAM) 50 MG tablet     . warfarin (COUMADIN) 2 MG tablet Take by mouth as directed.      No current facility-administered medications for this visit.       Musculoskeletal: Strength & Muscle Tone: Decreased Gait & Station: unsteady Patient leans: N/A  Psychiatric Specialty Exam: Review of Systems  Constitutional: Positive for weight loss.  Musculoskeletal: Positive for back pain and joint pain.  Neurological: Positive for weakness.  Psychiatric/Behavioral: Positive for depression. The patient is nervous/anxious.   All other systems reviewed and are negative.   Blood pressure 132/83, pulse 96, height 5\' 6"  (1.676 m), weight 182 lb (82.6 kg), SpO2 94 %.Body mass index is 29.38 kg/m.  General Appearance: Casual and Disheveled  Eye Contact:  Fair  Speech:  Clear and Coherent  Volume:  Decreased  Mood:  Anxious, Depressed and Hopeless  Affect:  Constricted, Depressed and Tearful  Thought Process:  Goal Directed  Orientation:  Full (Time, Place, and Person)  Thought Content: Rumination   Suicidal Thoughts:  No  Homicidal Thoughts:  No  Memory:  Immediate;   Good Recent;   Fair Remote;   Fair  Judgement:  Fair  Insight:  Fair  Psychomotor Activity:  Decreased  Concentration:  Concentration: Fair and Attention Span: Fair  Recall:  [employer] of Knowledge: Good  Language: Good  Akathisia:  No  Handed:  Right  AIMS (if indicated): not done  Assets:  Communication Skills Desire for Improvement Resilience Social Support Talents/Skills  ADL's:  Impaired  Cognition: WNL  Sleep:  Good   Screenings:   Assessment and Plan:  This patient is an 82 year old female who was going through a very difficult time.  She had only been married to her husband for short time before he died suddenly.  She is still grieving this.  She has tremendous issues with chronic pain and decreased mobility.  She did think the increased dosage of Valium helped but were not doing her any favors by impairing her memory and muscle strength so we will cut it back down to 2 mg 3 times daily as needed.  I have urged her to try to at least get out of bed every  day.  She will continue Cymbalta 60 mg twice daily for depression and Abilify 15 mg daily for augmentation.  She will return to see me in 4 weeks.  We will also discontinue the methylphenidate   Diannia Ruder, MD 06/22/2018, 1:54 PM

## 2018-07-04 DIAGNOSIS — Z299 Encounter for prophylactic measures, unspecified: Secondary | ICD-10-CM | POA: Diagnosis not present

## 2018-07-04 DIAGNOSIS — M7061 Trochanteric bursitis, right hip: Secondary | ICD-10-CM | POA: Diagnosis not present

## 2018-07-04 DIAGNOSIS — M329 Systemic lupus erythematosus, unspecified: Secondary | ICD-10-CM | POA: Diagnosis not present

## 2018-07-04 DIAGNOSIS — I2699 Other pulmonary embolism without acute cor pulmonale: Secondary | ICD-10-CM | POA: Diagnosis not present

## 2018-07-04 DIAGNOSIS — I10 Essential (primary) hypertension: Secondary | ICD-10-CM | POA: Diagnosis not present

## 2018-07-04 DIAGNOSIS — M1710 Unilateral primary osteoarthritis, unspecified knee: Secondary | ICD-10-CM | POA: Diagnosis not present

## 2018-07-11 ENCOUNTER — Other Ambulatory Visit (INDEPENDENT_AMBULATORY_CARE_PROVIDER_SITE_OTHER): Payer: Self-pay | Admitting: Radiology

## 2018-07-14 ENCOUNTER — Other Ambulatory Visit (INDEPENDENT_AMBULATORY_CARE_PROVIDER_SITE_OTHER): Payer: Self-pay | Admitting: Radiology

## 2018-07-14 MED ORDER — CELECOXIB 100 MG PO CAPS: ORAL_CAPSULE | ORAL | 0 refills | Status: DC

## 2018-07-17 ENCOUNTER — Other Ambulatory Visit (INDEPENDENT_AMBULATORY_CARE_PROVIDER_SITE_OTHER): Payer: Self-pay | Admitting: Orthopaedic Surgery

## 2018-07-19 DIAGNOSIS — Z79899 Other long term (current) drug therapy: Secondary | ICD-10-CM | POA: Diagnosis not present

## 2018-07-19 DIAGNOSIS — Z6827 Body mass index (BMI) 27.0-27.9, adult: Secondary | ICD-10-CM | POA: Diagnosis not present

## 2018-07-19 DIAGNOSIS — Z1339 Encounter for screening examination for other mental health and behavioral disorders: Secondary | ICD-10-CM | POA: Diagnosis not present

## 2018-07-19 DIAGNOSIS — E785 Hyperlipidemia, unspecified: Secondary | ICD-10-CM | POA: Diagnosis not present

## 2018-07-19 DIAGNOSIS — Z299 Encounter for prophylactic measures, unspecified: Secondary | ICD-10-CM | POA: Diagnosis not present

## 2018-07-19 DIAGNOSIS — Z1331 Encounter for screening for depression: Secondary | ICD-10-CM | POA: Diagnosis not present

## 2018-07-19 DIAGNOSIS — I2699 Other pulmonary embolism without acute cor pulmonale: Secondary | ICD-10-CM | POA: Diagnosis not present

## 2018-07-19 DIAGNOSIS — Z0000 Encounter for general adult medical examination without abnormal findings: Secondary | ICD-10-CM | POA: Diagnosis not present

## 2018-07-19 DIAGNOSIS — Z7189 Other specified counseling: Secondary | ICD-10-CM | POA: Diagnosis not present

## 2018-07-19 DIAGNOSIS — I10 Essential (primary) hypertension: Secondary | ICD-10-CM | POA: Diagnosis not present

## 2018-07-19 DIAGNOSIS — R5383 Other fatigue: Secondary | ICD-10-CM | POA: Diagnosis not present

## 2018-07-21 NOTE — Telephone Encounter (Signed)
SENT MEDS IN  

## 2018-07-27 ENCOUNTER — Ambulatory Visit (INDEPENDENT_AMBULATORY_CARE_PROVIDER_SITE_OTHER): Payer: Medicare Other | Admitting: Psychiatry

## 2018-07-27 ENCOUNTER — Encounter (HOSPITAL_COMMUNITY): Payer: Self-pay | Admitting: Psychiatry

## 2018-07-27 VITALS — BP 144/80 | HR 90 | Ht 66.0 in | Wt 182.0 lb

## 2018-07-27 DIAGNOSIS — F331 Major depressive disorder, recurrent, moderate: Secondary | ICD-10-CM | POA: Diagnosis not present

## 2018-07-27 DIAGNOSIS — I2510 Atherosclerotic heart disease of native coronary artery without angina pectoris: Secondary | ICD-10-CM

## 2018-07-27 MED ORDER — ARIPIPRAZOLE 10 MG PO TABS: 15.0000 mg | ORAL_TABLET | Freq: Every day | ORAL | 3 refills | Status: DC

## 2018-07-27 MED ORDER — DULOXETINE HCL 60 MG PO CPEP: 60.0000 mg | ORAL_CAPSULE | Freq: Two times a day (BID) | ORAL | 3 refills | Status: DC

## 2018-07-27 MED ORDER — DIAZEPAM 2 MG PO TABS: 2.0000 mg | ORAL_TABLET | Freq: Three times a day (TID) | ORAL | 2 refills | Status: DC | PRN

## 2018-07-27 NOTE — Progress Notes (Signed)
BH MD/PA/NP OP Progress Note  07/27/2018 1:37 PM Grace Howard  MRN:  [id number]  Chief Complaint:  Chief Complaint    Depression; Anxiety; Follow-up     HPI: This patient is a 82 year oldfemale who lives [address]. She has a sister in [place] and 2 sons who live in [place] in [place]. 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  The patient states that she's had depression for many years. She was in a bad marriage in the 40s and was hospitalized at [facility]. She's been seeing psychiatrist treatment ever since then in the most part she's been fairly stable recently. However it she's worried about several health issues. She had a breast cyst which turned out to be benign. She has thyroid nodules but no change in her thyroid hormone. She also has skin cancer in the thickening of her uterine lining. All these things have been getting her worried. She is only on 2 mg of Valium per day and would like an increase because she is very anxious. Her mood is fairly stable but she tends to stay to herself a lot. She denies crying spells panic attacks suicidal ideation her appetite change  The patient returns after 4 weeks.  Last time her caregiver thought that she was oversedated on the Valium so I have cut it down to 2 mg and she seems to be doing better she is neatly dressed today and awake and alert.  She is still grieving the loss of her husband from several months ago and has to do "a lot of paperwork" regarding his death.  She is trying to get around every day with her walker she is sleeping well at night and denies being suicidal but she is understandably still sad.  Her thought process seems to be organized Visit Diagnosis:    ICD-10-CM   1. Major depressive disorder, recurrent episode, moderate (HCC) F33.1     Past Psychiatric History:  2 previous admissions in the 61s for depression Past Medical History:  Past Medical History:  Diagnosis Date  .  Anxiety   . Depression   . GERD (gastroesophageal reflux disease)   . Headache(784.0)   . HTN (hypertension)   . Hyperlipidemia   . Insomnia   . Lupus (HCC)   . Macular degeneration June 2013  . Osteoarthritis June 2013  . Osteoporosis   . PE (pulmonary embolism) 2008   not sure why  . Seizures (HCC)   . Wears glasses     Past Surgical History:  Procedure Laterality Date  . abdomnial surgery    . ANKLE FRACTURE SURGERY  2000   right  . APPENDECTOMY    . CARDIAC CATHETERIZATION  2008   PE-filter in  . COLONOSCOPY    . DILITATION & CURRETTAGE/HYSTROSCOPY WITH ESSURE    . EYE SURGERY     both cataracts  . HARDWARE REMOVAL Left 02/26/2014   Procedure: REMOVAL HARDWARE ;  Surgeon: Tami Ribas, MD;  Location:  SURGERY CENTER;  Service: Orthopedics;  Laterality: Left;  . TONSILLECTOMY    . WRIST SURGERY  2011   may-lt    Family Psychiatric History: See below  Family History:  Family History  Problem Relation Age of Onset  . Dementia Father   . ADD / ADHD Other   . ADD / ADHD Other   . Healthy Son   . Healthy Son   . Depression Sister   . Anxiety disorder Sister   .  Sexual abuse Sister   . Alcohol abuse Paternal Aunt   . Drug abuse Paternal Uncle   . Bipolar disorder Neg Hx   . OCD Neg Hx   . Paranoid behavior Neg Hx   . Schizophrenia Neg Hx   . Seizures Neg Hx   . Physical abuse Neg Hx     Social History:  Social History   Socioeconomic History  . Marital status: Widowed    Spouse name: Not on file  . Number of children: Not on file  . Years of education: Not on file  . Highest education level: Not on file  Occupational History  . Not on file  Social Needs  . Financial resource strain: Not on file  . Food insecurity:    Worry: Not on file    Inability: Not on file  . Transportation needs:    Medical: Not on file    Non-medical: Not on file  Tobacco Use  . Smoking status: Former Smoker    Last attempt to quit: 02/19/1978    Years since  quitting: 40.4  . Smokeless tobacco: Never Used  Substance and Sexual Activity  . Alcohol use: No  . Drug use: No  . Sexual activity: Never  Lifestyle  . Physical activity:    Days per week: Not on file    Minutes per session: Not on file  . Stress: Not on file  Relationships  . Social connections:    Talks on phone: Not on file    Gets together: Not on file    Attends religious service: Not on file    Active member of club or organization: Not on file    Attends meetings of clubs or organizations: Not on file    Relationship status: Not on file  Other Topics Concern  . Not on file  Social History Narrative  . Not on file    Allergies:  Allergies  Allergen Reactions  . Tegretol [Carbamazepine] Other (See Comments)    Thought to have caused her Lupus    Metabolic Disorder Labs: Lab Results  Component Value Date   HGBA1C 5.7 (H) 02/15/2012   MPG 117 (H) 02/15/2012   No results found for: PROLACTIN No results found for: CHOL, TRIG, HDL, CHOLHDL, VLDL, LDLCALC No results found for: TSH  Therapeutic Level Labs: No results found for: LITHIUM No results found for: VALPROATE No components found for:  CBMZ  Current Medications: Current Outpatient Medications  Medication Sig Dispense Refill  . amLODipine (NORVASC) 5 MG tablet Take 5 mg by mouth daily.    . ARIPiprazole (ABILIFY) 10 MG tablet Take 1.5 tablets (15 mg total) by mouth daily. 45 tablet 3  . benazepril (LOTENSIN) 20 MG tablet     . Calcium Carbonate-Vitamin D (CALCIUM + D PO) Take by mouth 2 (two) times daily.    . celecoxib (CELEBREX) 100 MG capsule Take 1 capsule by mouth twice daily 60 capsule 0  . diazepam (VALIUM) 2 MG tablet Take 1 tablet (2 mg total) by mouth 3 (three) times daily as needed for anxiety. 90 tablet 2  . diclofenac sodium (VOLTAREN) 1 % GEL APPLY 3GM TWICE DAILY  2  . DULoxetine (CYMBALTA) 60 MG capsule Take 1 capsule (60 mg total) by mouth 2 (two) times daily. 60 capsule 3  .  hydroxychloroquine (PLAQUENIL) 200 MG tablet Take 300 mg by mouth daily.     Marland Kitchen levETIRAcetam (KEPPRA) 500 MG tablet Take 500 mg by mouth daily.     Marland Kitchen  metoprolol tartrate (LOPRESSOR) 25 MG tablet Take 25 mg by mouth 2 (two) times daily.    . Multiple Vitamin (MULTIVITAMIN) capsule Take 1 capsule by mouth daily.    . Multiple Vitamins-Minerals (HM COMPLETE 50+ MENS ULTIMATE PO) TAKE ONE TABLET BY MOUTH DAILY  12  . Multiple Vitamins-Minerals (PRESERVISION AREDS) TABS Take 1 tablet by mouth daily.  12  . omega-3 acid ethyl esters (LOVAZA) 1 G capsule Take 2 g by mouth daily.    . pantoprazole (PROTONIX) 40 MG tablet Take 40 mg by mouth daily.    . pravastatin (PRAVACHOL) 80 MG tablet Take 80 mg by mouth daily.    Marland Kitchen tiZANidine (ZANAFLEX) 2 MG tablet Take 2 mg by mouth 2 (two) times daily as needed for muscle spasms.    . traMADol (ULTRAM) 50 MG tablet     . warfarin (COUMADIN) 2 MG tablet Take by mouth as directed.      No current facility-administered medications for this visit.      Musculoskeletal: Strength & Muscle Tone: decreased Gait & Station: unsteady Patient leans: N/A  Psychiatric Specialty Exam: Review of Systems  Constitutional: Positive for malaise/fatigue.  Musculoskeletal: Positive for back pain and joint pain.  All other systems reviewed and are negative.   Blood pressure (!) 144/80, pulse 90, height 5\' 6"  (1.676 m), weight 182 lb (82.6 kg), SpO2 96 %.Body mass index is 29.38 kg/m.  General Appearance: Casual and Fairly Groomed  Eye Contact:  Good  Speech:  Clear and Coherent  Volume:  Normal  Mood:  Dysphoric  Affect:  Constricted  Thought Process:  Goal Directed  Orientation:  Full (Time, Place, and Person)  Thought Content: Rumination   Suicidal Thoughts:  No  Homicidal Thoughts:  No  Memory:  Immediate;   Good Recent;   Good Remote;   Good  Judgement:  Good  Insight:  Good  Psychomotor Activity:  Decreased  Concentration:  Concentration: Fair and  Attention Span: Fair  Recall:  Good  Fund of Knowledge: Good  Language: Good  Akathisia:  No  Handed:  Right  AIMS (if indicated): not done  Assets:  Communication Skills Desire for Improvement Resilience Social Support Talents/Skills  ADL's:  Intact  Cognition: WNL  Sleep:  Good   Screenings:   Assessment and Plan: This patient is an 82 year old female with a history of depression and anxiety.  She seems to have benefited from the decrease in Valium to 2 mg 3 times daily as needed as she is less drowsy and out of it.  She we will also continue Cymbalta 60 mg twice daily for depression and Abilify 15 mg daily for augmentation.  She will return to see me in 2 months   Diannia Ruder, MD 07/27/2018, 1:37 PM

## 2018-08-02 DIAGNOSIS — I4891 Unspecified atrial fibrillation: Secondary | ICD-10-CM | POA: Diagnosis not present

## 2018-08-02 DIAGNOSIS — I10 Essential (primary) hypertension: Secondary | ICD-10-CM | POA: Diagnosis not present

## 2018-08-02 DIAGNOSIS — M159 Polyosteoarthritis, unspecified: Secondary | ICD-10-CM | POA: Diagnosis not present

## 2018-08-02 DIAGNOSIS — I2510 Atherosclerotic heart disease of native coronary artery without angina pectoris: Secondary | ICD-10-CM | POA: Diagnosis not present

## 2018-08-25 DIAGNOSIS — I2510 Atherosclerotic heart disease of native coronary artery without angina pectoris: Secondary | ICD-10-CM | POA: Diagnosis not present

## 2018-08-25 DIAGNOSIS — I10 Essential (primary) hypertension: Secondary | ICD-10-CM | POA: Diagnosis not present

## 2018-08-25 DIAGNOSIS — M159 Polyosteoarthritis, unspecified: Secondary | ICD-10-CM | POA: Diagnosis not present

## 2018-08-25 DIAGNOSIS — I4891 Unspecified atrial fibrillation: Secondary | ICD-10-CM | POA: Diagnosis not present

## 2018-09-12 DIAGNOSIS — Z299 Encounter for prophylactic measures, unspecified: Secondary | ICD-10-CM | POA: Diagnosis not present

## 2018-09-12 DIAGNOSIS — M25562 Pain in left knee: Secondary | ICD-10-CM | POA: Diagnosis not present

## 2018-09-12 DIAGNOSIS — I10 Essential (primary) hypertension: Secondary | ICD-10-CM | POA: Diagnosis not present

## 2018-09-12 DIAGNOSIS — Z6828 Body mass index (BMI) 28.0-28.9, adult: Secondary | ICD-10-CM | POA: Diagnosis not present

## 2018-09-12 DIAGNOSIS — R262 Difficulty in walking, not elsewhere classified: Secondary | ICD-10-CM | POA: Diagnosis not present

## 2018-09-12 DIAGNOSIS — R269 Unspecified abnormalities of gait and mobility: Secondary | ICD-10-CM | POA: Diagnosis not present

## 2018-09-12 DIAGNOSIS — M1712 Unilateral primary osteoarthritis, left knee: Secondary | ICD-10-CM | POA: Diagnosis not present

## 2018-09-12 DIAGNOSIS — E785 Hyperlipidemia, unspecified: Secondary | ICD-10-CM | POA: Diagnosis not present

## 2018-09-19 DIAGNOSIS — M1711 Unilateral primary osteoarthritis, right knee: Secondary | ICD-10-CM | POA: Diagnosis not present

## 2018-09-19 DIAGNOSIS — I10 Essential (primary) hypertension: Secondary | ICD-10-CM | POA: Diagnosis not present

## 2018-09-19 DIAGNOSIS — Z299 Encounter for prophylactic measures, unspecified: Secondary | ICD-10-CM | POA: Diagnosis not present

## 2018-09-19 DIAGNOSIS — Z6828 Body mass index (BMI) 28.0-28.9, adult: Secondary | ICD-10-CM | POA: Diagnosis not present

## 2018-09-22 DIAGNOSIS — I4891 Unspecified atrial fibrillation: Secondary | ICD-10-CM | POA: Diagnosis not present

## 2018-09-22 DIAGNOSIS — M159 Polyosteoarthritis, unspecified: Secondary | ICD-10-CM | POA: Diagnosis not present

## 2018-09-22 DIAGNOSIS — I10 Essential (primary) hypertension: Secondary | ICD-10-CM | POA: Diagnosis not present

## 2018-09-22 DIAGNOSIS — I2510 Atherosclerotic heart disease of native coronary artery without angina pectoris: Secondary | ICD-10-CM | POA: Diagnosis not present

## 2018-09-25 DIAGNOSIS — M1990 Unspecified osteoarthritis, unspecified site: Secondary | ICD-10-CM | POA: Diagnosis not present

## 2018-09-25 DIAGNOSIS — Z6828 Body mass index (BMI) 28.0-28.9, adult: Secondary | ICD-10-CM | POA: Diagnosis not present

## 2018-09-25 DIAGNOSIS — M2560 Stiffness of unspecified joint, not elsewhere classified: Secondary | ICD-10-CM | POA: Diagnosis not present

## 2018-09-25 DIAGNOSIS — Z299 Encounter for prophylactic measures, unspecified: Secondary | ICD-10-CM | POA: Diagnosis not present

## 2018-09-25 DIAGNOSIS — M1711 Unilateral primary osteoarthritis, right knee: Secondary | ICD-10-CM | POA: Diagnosis not present

## 2018-09-25 DIAGNOSIS — R262 Difficulty in walking, not elsewhere classified: Secondary | ICD-10-CM | POA: Diagnosis not present

## 2018-09-25 DIAGNOSIS — I10 Essential (primary) hypertension: Secondary | ICD-10-CM | POA: Diagnosis not present

## 2018-09-26 ENCOUNTER — Encounter (HOSPITAL_COMMUNITY): Payer: Self-pay | Admitting: Psychiatry

## 2018-09-26 ENCOUNTER — Ambulatory Visit (INDEPENDENT_AMBULATORY_CARE_PROVIDER_SITE_OTHER): Payer: Medicare Other | Admitting: Psychiatry

## 2018-09-26 VITALS — BP 160/84 | HR 91 | Ht 66.0 in | Wt 185.0 lb

## 2018-09-26 DIAGNOSIS — F331 Major depressive disorder, recurrent, moderate: Secondary | ICD-10-CM | POA: Diagnosis not present

## 2018-09-26 MED ORDER — ARIPIPRAZOLE 10 MG PO TABS: 15.0000 mg | ORAL_TABLET | Freq: Every day | ORAL | 3 refills | Status: DC

## 2018-09-26 MED ORDER — DULOXETINE HCL 60 MG PO CPEP: 60.0000 mg | ORAL_CAPSULE | Freq: Two times a day (BID) | ORAL | 3 refills | Status: DC

## 2018-09-26 MED ORDER — TRAZODONE HCL 50 MG PO TABS: ORAL_TABLET | ORAL | 2 refills | Status: DC

## 2018-09-26 MED ORDER — DIAZEPAM 2 MG PO TABS: 2.0000 mg | ORAL_TABLET | Freq: Three times a day (TID) | ORAL | 2 refills | Status: DC | PRN
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
## 2018-10-17 ENCOUNTER — Telehealth (HOSPITAL_COMMUNITY): Payer: Self-pay | Admitting: *Deleted

## 2018-10-17 ENCOUNTER — Other Ambulatory Visit (HOSPITAL_COMMUNITY): Payer: Self-pay | Admitting: Psychiatry

## 2018-10-17 MED ORDER — TRAZODONE HCL 150 MG PO TABS: 75.0000 mg | ORAL_TABLET | Freq: Every day | ORAL | 2 refills | Status: DC

## 2018-10-17 NOTE — Telephone Encounter (Signed)
I sent in trazodone 75 mg to take at bedtime--half of 150 mg

## 2018-10-17 NOTE — Telephone Encounter (Signed)
Dr Tenny Craw Patient called stating that she has been taking  1 1/2 of the Trazodone & only sleeping 5 hrs. She called for a refill & was told it was to early she states she did what the bottle said : Sig: Take one one half to one at bedtime.. She asked for a refill

## 2018-10-20 DIAGNOSIS — I10 Essential (primary) hypertension: Secondary | ICD-10-CM | POA: Diagnosis not present

## 2018-10-20 DIAGNOSIS — M159 Polyosteoarthritis, unspecified: Secondary | ICD-10-CM | POA: Diagnosis not present

## 2018-10-20 DIAGNOSIS — I2510 Atherosclerotic heart disease of native coronary artery without angina pectoris: Secondary | ICD-10-CM | POA: Diagnosis not present

## 2018-10-20 DIAGNOSIS — I4891 Unspecified atrial fibrillation: Secondary | ICD-10-CM | POA: Diagnosis not present

## 2018-10-23 DIAGNOSIS — M1712 Unilateral primary osteoarthritis, left knee: Secondary | ICD-10-CM | POA: Diagnosis not present

## 2018-10-23 DIAGNOSIS — Z299 Encounter for prophylactic measures, unspecified: Secondary | ICD-10-CM | POA: Diagnosis not present

## 2018-10-23 DIAGNOSIS — I10 Essential (primary) hypertension: Secondary | ICD-10-CM | POA: Diagnosis not present

## 2018-10-23 DIAGNOSIS — Z6828 Body mass index (BMI) 28.0-28.9, adult: Secondary | ICD-10-CM | POA: Diagnosis not present

## 2018-10-24 DIAGNOSIS — Z299 Encounter for prophylactic measures, unspecified: Secondary | ICD-10-CM | POA: Diagnosis not present

## 2018-10-24 DIAGNOSIS — M1711 Unilateral primary osteoarthritis, right knee: Secondary | ICD-10-CM | POA: Diagnosis not present

## 2018-10-24 DIAGNOSIS — Z6828 Body mass index (BMI) 28.0-28.9, adult: Secondary | ICD-10-CM | POA: Diagnosis not present

## 2018-10-24 DIAGNOSIS — I10 Essential (primary) hypertension: Secondary | ICD-10-CM | POA: Diagnosis not present

## 2018-10-26 DIAGNOSIS — M1712 Unilateral primary osteoarthritis, left knee: Secondary | ICD-10-CM | POA: Diagnosis not present

## 2018-10-26 DIAGNOSIS — I10 Essential (primary) hypertension: Secondary | ICD-10-CM | POA: Diagnosis not present

## 2018-10-26 DIAGNOSIS — Z299 Encounter for prophylactic measures, unspecified: Secondary | ICD-10-CM | POA: Diagnosis not present

## 2018-10-26 DIAGNOSIS — Z6828 Body mass index (BMI) 28.0-28.9, adult: Secondary | ICD-10-CM | POA: Diagnosis not present

## 2018-11-03 DIAGNOSIS — M329 Systemic lupus erythematosus, unspecified: Secondary | ICD-10-CM | POA: Diagnosis not present

## 2018-11-03 DIAGNOSIS — I10 Essential (primary) hypertension: Secondary | ICD-10-CM | POA: Diagnosis not present

## 2018-11-03 DIAGNOSIS — I2699 Other pulmonary embolism without acute cor pulmonale: Secondary | ICD-10-CM | POA: Diagnosis not present

## 2018-11-03 DIAGNOSIS — M1712 Unilateral primary osteoarthritis, left knee: Secondary | ICD-10-CM | POA: Diagnosis not present

## 2018-11-03 DIAGNOSIS — Z299 Encounter for prophylactic measures, unspecified: Secondary | ICD-10-CM | POA: Diagnosis not present

## 2018-11-03 DIAGNOSIS — Z6828 Body mass index (BMI) 28.0-28.9, adult: Secondary | ICD-10-CM | POA: Diagnosis not present

## 2018-11-03 DIAGNOSIS — E1165 Type 2 diabetes mellitus with hyperglycemia: Secondary | ICD-10-CM | POA: Diagnosis not present

## 2018-11-07 DIAGNOSIS — I10 Essential (primary) hypertension: Secondary | ICD-10-CM | POA: Diagnosis not present

## 2018-11-07 DIAGNOSIS — M1711 Unilateral primary osteoarthritis, right knee: Secondary | ICD-10-CM | POA: Diagnosis not present

## 2018-11-07 DIAGNOSIS — Z299 Encounter for prophylactic measures, unspecified: Secondary | ICD-10-CM | POA: Diagnosis not present

## 2018-11-07 DIAGNOSIS — Z6828 Body mass index (BMI) 28.0-28.9, adult: Secondary | ICD-10-CM | POA: Diagnosis not present

## 2018-11-08 ENCOUNTER — Ambulatory Visit (HOSPITAL_COMMUNITY): Payer: Medicare Other | Admitting: Psychiatry

## 2018-11-09 DIAGNOSIS — M1712 Unilateral primary osteoarthritis, left knee: Secondary | ICD-10-CM | POA: Diagnosis not present

## 2018-11-09 DIAGNOSIS — Z6828 Body mass index (BMI) 28.0-28.9, adult: Secondary | ICD-10-CM | POA: Diagnosis not present

## 2018-11-09 DIAGNOSIS — Z299 Encounter for prophylactic measures, unspecified: Secondary | ICD-10-CM | POA: Diagnosis not present

## 2018-11-09 DIAGNOSIS — I10 Essential (primary) hypertension: Secondary | ICD-10-CM | POA: Diagnosis not present

## 2018-11-14 DIAGNOSIS — Z299 Encounter for prophylactic measures, unspecified: Secondary | ICD-10-CM | POA: Diagnosis not present

## 2018-11-14 DIAGNOSIS — M1711 Unilateral primary osteoarthritis, right knee: Secondary | ICD-10-CM | POA: Diagnosis not present

## 2018-11-14 DIAGNOSIS — Z6828 Body mass index (BMI) 28.0-28.9, adult: Secondary | ICD-10-CM | POA: Diagnosis not present

## 2018-11-14 DIAGNOSIS — I10 Essential (primary) hypertension: Secondary | ICD-10-CM | POA: Diagnosis not present

## 2018-11-15 DIAGNOSIS — M159 Polyosteoarthritis, unspecified: Secondary | ICD-10-CM | POA: Diagnosis not present

## 2018-11-15 DIAGNOSIS — I2510 Atherosclerotic heart disease of native coronary artery without angina pectoris: Secondary | ICD-10-CM | POA: Diagnosis not present

## 2018-11-15 DIAGNOSIS — I10 Essential (primary) hypertension: Secondary | ICD-10-CM | POA: Diagnosis not present

## 2018-11-15 DIAGNOSIS — I4891 Unspecified atrial fibrillation: Secondary | ICD-10-CM | POA: Diagnosis not present

## 2018-11-17 DIAGNOSIS — I10 Essential (primary) hypertension: Secondary | ICD-10-CM | POA: Diagnosis not present

## 2018-11-17 DIAGNOSIS — Z299 Encounter for prophylactic measures, unspecified: Secondary | ICD-10-CM | POA: Diagnosis not present

## 2018-11-17 DIAGNOSIS — Z6828 Body mass index (BMI) 28.0-28.9, adult: Secondary | ICD-10-CM | POA: Diagnosis not present

## 2018-11-17 DIAGNOSIS — M1712 Unilateral primary osteoarthritis, left knee: Secondary | ICD-10-CM | POA: Diagnosis not present

## 2018-11-17 DIAGNOSIS — M1990 Unspecified osteoarthritis, unspecified site: Secondary | ICD-10-CM | POA: Diagnosis not present

## 2018-11-21 DIAGNOSIS — I10 Essential (primary) hypertension: Secondary | ICD-10-CM | POA: Diagnosis not present

## 2018-11-21 DIAGNOSIS — M1711 Unilateral primary osteoarthritis, right knee: Secondary | ICD-10-CM | POA: Diagnosis not present

## 2018-11-21 DIAGNOSIS — Z6828 Body mass index (BMI) 28.0-28.9, adult: Secondary | ICD-10-CM | POA: Diagnosis not present

## 2018-11-21 DIAGNOSIS — Z299 Encounter for prophylactic measures, unspecified: Secondary | ICD-10-CM | POA: Diagnosis not present

## 2018-11-23 DIAGNOSIS — Z299 Encounter for prophylactic measures, unspecified: Secondary | ICD-10-CM | POA: Diagnosis not present

## 2018-11-23 DIAGNOSIS — M1712 Unilateral primary osteoarthritis, left knee: Secondary | ICD-10-CM | POA: Diagnosis not present

## 2018-11-23 DIAGNOSIS — Z6828 Body mass index (BMI) 28.0-28.9, adult: Secondary | ICD-10-CM | POA: Diagnosis not present

## 2018-11-27 ENCOUNTER — Ambulatory Visit (HOSPITAL_COMMUNITY): Payer: Medicare Other | Admitting: Psychiatry

## 2018-12-01 DIAGNOSIS — Z299 Encounter for prophylactic measures, unspecified: Secondary | ICD-10-CM | POA: Diagnosis not present

## 2018-12-01 DIAGNOSIS — I10 Essential (primary) hypertension: Secondary | ICD-10-CM | POA: Diagnosis not present

## 2018-12-01 DIAGNOSIS — M1711 Unilateral primary osteoarthritis, right knee: Secondary | ICD-10-CM | POA: Diagnosis not present

## 2018-12-01 DIAGNOSIS — Z6828 Body mass index (BMI) 28.0-28.9, adult: Secondary | ICD-10-CM | POA: Diagnosis not present

## 2018-12-13 DIAGNOSIS — I4891 Unspecified atrial fibrillation: Secondary | ICD-10-CM | POA: Diagnosis not present

## 2018-12-13 DIAGNOSIS — M159 Polyosteoarthritis, unspecified: Secondary | ICD-10-CM | POA: Diagnosis not present

## 2018-12-13 DIAGNOSIS — I2510 Atherosclerotic heart disease of native coronary artery without angina pectoris: Secondary | ICD-10-CM | POA: Diagnosis not present

## 2018-12-13 DIAGNOSIS — I10 Essential (primary) hypertension: Secondary | ICD-10-CM | POA: Diagnosis not present

## 2018-12-19 ENCOUNTER — Telehealth (HOSPITAL_COMMUNITY): Payer: Self-pay | Admitting: *Deleted

## 2018-12-19 NOTE — Telephone Encounter (Signed)
Left message to go up to trazodone 150 mg

## 2018-12-19 NOTE — Telephone Encounter (Signed)
Spoke with patient & informed per provider Dr. Tenny Craw to go up to trazodone 150 mg

## 2018-12-19 NOTE — Telephone Encounter (Signed)
Dr Tenny Craw Patient called & not really sound like herself please call # [phone]. Patient's states that over the last 2 weeks it has become more persistent begin unable to sleep. She stated she is taking the Trazodone 150 mg as prescribed Taking 0.5 tablets (75 mg total) by mouth at bedtime

## 2018-12-29 ENCOUNTER — Other Ambulatory Visit (HOSPITAL_COMMUNITY): Payer: Self-pay | Admitting: Psychiatry

## 2018-12-29 MED ORDER — TRAZODONE HCL 150 MG PO TABS: 75.0000 mg | ORAL_TABLET | Freq: Every day | ORAL | 2 refills | Status: DC

## 2019-01-01 ENCOUNTER — Telehealth (HOSPITAL_COMMUNITY): Payer: Self-pay | Admitting: *Deleted

## 2019-01-01 ENCOUNTER — Other Ambulatory Visit (HOSPITAL_COMMUNITY): Payer: Self-pay | Admitting: Psychiatry

## 2019-01-01 MED ORDER — TRAZODONE HCL 150 MG PO TABS: 150.0000 mg | ORAL_TABLET | Freq: Every day | ORAL | 2 refills | Status: DC

## 2019-01-01 NOTE — Telephone Encounter (Signed)
Left message, med sent in with new directions to take one at bedtime

## 2019-01-01 NOTE — Telephone Encounter (Signed)
Dr Tenny Craw The patient's home health aide called requesting to speak with you for clarification on patient's Trazodone 150 mg medication. The aide stated that the Rx gave them 15 pills of the script sent in on Friday take 0.5 tablets (75 mg total) by mouth at bedtime. And that the patient has been taken a whole tablet. Please call  [phone]

## 2019-01-02 ENCOUNTER — Telehealth: Payer: Self-pay | Admitting: *Deleted

## 2019-01-02 NOTE — Telephone Encounter (Signed)
   Primary Cardiologist:  Dina Rich, MD   Patient contacted.  History reviewed.  No symptoms to suggest any unstable cardiac conditions.  Based on discussion, with current pandemic situation, we will be postponing this appointment for Grace Howard with a plan for f/u in July 2020 or sooner if feasible/necessary.  If symptoms change, she has been instructed to contact our office.    Thalia Bloodgood, RN  01/02/2019 3:32 PM         .

## 2019-01-04 DIAGNOSIS — Z299 Encounter for prophylactic measures, unspecified: Secondary | ICD-10-CM | POA: Diagnosis not present

## 2019-01-04 DIAGNOSIS — I10 Essential (primary) hypertension: Secondary | ICD-10-CM | POA: Diagnosis not present

## 2019-01-04 DIAGNOSIS — Z713 Dietary counseling and surveillance: Secondary | ICD-10-CM | POA: Diagnosis not present

## 2019-01-04 DIAGNOSIS — M1990 Unspecified osteoarthritis, unspecified site: Secondary | ICD-10-CM | POA: Diagnosis not present

## 2019-01-05 ENCOUNTER — Ambulatory Visit: Payer: Self-pay | Admitting: Cardiology

## 2019-01-17 DIAGNOSIS — I2510 Atherosclerotic heart disease of native coronary artery without angina pectoris: Secondary | ICD-10-CM | POA: Diagnosis not present

## 2019-01-17 DIAGNOSIS — I4891 Unspecified atrial fibrillation: Secondary | ICD-10-CM | POA: Diagnosis not present

## 2019-01-17 DIAGNOSIS — M159 Polyosteoarthritis, unspecified: Secondary | ICD-10-CM | POA: Diagnosis not present

## 2019-01-17 DIAGNOSIS — I10 Essential (primary) hypertension: Secondary | ICD-10-CM | POA: Diagnosis not present

## 2019-01-31 ENCOUNTER — Other Ambulatory Visit (HOSPITAL_COMMUNITY): Payer: Self-pay | Admitting: Psychiatry

## 2019-02-06 DIAGNOSIS — H81393 Other peripheral vertigo, bilateral: Secondary | ICD-10-CM | POA: Diagnosis not present

## 2019-02-06 DIAGNOSIS — I7389 Other specified peripheral vascular diseases: Secondary | ICD-10-CM | POA: Diagnosis not present

## 2019-02-06 DIAGNOSIS — G9009 Other idiopathic peripheral autonomic neuropathy: Secondary | ICD-10-CM | POA: Diagnosis not present

## 2019-02-08 DIAGNOSIS — I4891 Unspecified atrial fibrillation: Secondary | ICD-10-CM | POA: Diagnosis present

## 2019-02-08 DIAGNOSIS — R5381 Other malaise: Secondary | ICD-10-CM | POA: Diagnosis not present

## 2019-02-08 DIAGNOSIS — J449 Chronic obstructive pulmonary disease, unspecified: Secondary | ICD-10-CM | POA: Diagnosis not present

## 2019-02-08 DIAGNOSIS — E871 Hypo-osmolality and hyponatremia: Secondary | ICD-10-CM | POA: Diagnosis not present

## 2019-02-08 DIAGNOSIS — E119 Type 2 diabetes mellitus without complications: Secondary | ICD-10-CM | POA: Diagnosis not present

## 2019-02-08 DIAGNOSIS — Z86718 Personal history of other venous thrombosis and embolism: Secondary | ICD-10-CM | POA: Diagnosis not present

## 2019-02-08 DIAGNOSIS — R41841 Cognitive communication deficit: Secondary | ICD-10-CM | POA: Diagnosis not present

## 2019-02-08 DIAGNOSIS — R000 Tachycardia, unspecified: Secondary | ICD-10-CM | POA: Diagnosis not present

## 2019-02-08 DIAGNOSIS — R2689 Other abnormalities of gait and mobility: Secondary | ICD-10-CM | POA: Diagnosis not present

## 2019-02-08 DIAGNOSIS — F339 Major depressive disorder, recurrent, unspecified: Secondary | ICD-10-CM | POA: Diagnosis not present

## 2019-02-08 DIAGNOSIS — Z48815 Encounter for surgical aftercare following surgery on the digestive system: Secondary | ICD-10-CM | POA: Diagnosis not present

## 2019-02-08 DIAGNOSIS — K8020 Calculus of gallbladder without cholecystitis without obstruction: Secondary | ICD-10-CM | POA: Diagnosis not present

## 2019-02-08 DIAGNOSIS — K8012 Calculus of gallbladder with acute and chronic cholecystitis without obstruction: Secondary | ICD-10-CM | POA: Diagnosis present

## 2019-02-08 DIAGNOSIS — R1013 Epigastric pain: Secondary | ICD-10-CM | POA: Diagnosis not present

## 2019-02-08 DIAGNOSIS — Z7401 Bed confinement status: Secondary | ICD-10-CM | POA: Diagnosis not present

## 2019-02-08 DIAGNOSIS — R0902 Hypoxemia: Secondary | ICD-10-CM | POA: Diagnosis not present

## 2019-02-08 DIAGNOSIS — M6281 Muscle weakness (generalized): Secondary | ICD-10-CM | POA: Diagnosis not present

## 2019-02-08 DIAGNOSIS — S3993XA Unspecified injury of pelvis, initial encounter: Secondary | ICD-10-CM | POA: Diagnosis not present

## 2019-02-08 DIAGNOSIS — S3991XA Unspecified injury of abdomen, initial encounter: Secondary | ICD-10-CM | POA: Diagnosis not present

## 2019-02-08 DIAGNOSIS — K810 Acute cholecystitis: Secondary | ICD-10-CM | POA: Diagnosis not present

## 2019-02-08 DIAGNOSIS — R1011 Right upper quadrant pain: Secondary | ICD-10-CM | POA: Diagnosis not present

## 2019-02-08 DIAGNOSIS — K8010 Calculus of gallbladder with chronic cholecystitis without obstruction: Secondary | ICD-10-CM | POA: Diagnosis not present

## 2019-02-08 DIAGNOSIS — F419 Anxiety disorder, unspecified: Secondary | ICD-10-CM | POA: Diagnosis not present

## 2019-02-08 DIAGNOSIS — I10 Essential (primary) hypertension: Secondary | ICD-10-CM | POA: Diagnosis not present

## 2019-02-08 DIAGNOSIS — D649 Anemia, unspecified: Secondary | ICD-10-CM | POA: Diagnosis not present

## 2019-02-08 DIAGNOSIS — G92 Toxic encephalopathy: Secondary | ICD-10-CM | POA: Diagnosis not present

## 2019-02-08 DIAGNOSIS — I2510 Atherosclerotic heart disease of native coronary artery without angina pectoris: Secondary | ICD-10-CM | POA: Diagnosis not present

## 2019-02-08 DIAGNOSIS — E8770 Fluid overload, unspecified: Secondary | ICD-10-CM | POA: Diagnosis not present

## 2019-02-08 DIAGNOSIS — Z1159 Encounter for screening for other viral diseases: Secondary | ICD-10-CM | POA: Diagnosis not present

## 2019-02-08 DIAGNOSIS — N281 Cyst of kidney, acquired: Secondary | ICD-10-CM | POA: Diagnosis not present

## 2019-02-08 DIAGNOSIS — Z86711 Personal history of pulmonary embolism: Secondary | ICD-10-CM | POA: Diagnosis not present

## 2019-02-08 DIAGNOSIS — J9811 Atelectasis: Secondary | ICD-10-CM | POA: Diagnosis not present

## 2019-02-08 DIAGNOSIS — J90 Pleural effusion, not elsewhere classified: Secondary | ICD-10-CM | POA: Diagnosis not present

## 2019-02-08 DIAGNOSIS — R0602 Shortness of breath: Secondary | ICD-10-CM | POA: Diagnosis not present

## 2019-02-08 DIAGNOSIS — Z87891 Personal history of nicotine dependence: Secondary | ICD-10-CM | POA: Diagnosis not present

## 2019-02-08 DIAGNOSIS — N83201 Unspecified ovarian cyst, right side: Secondary | ICD-10-CM | POA: Diagnosis not present

## 2019-02-08 DIAGNOSIS — Z9181 History of falling: Secondary | ICD-10-CM | POA: Diagnosis not present

## 2019-02-08 DIAGNOSIS — R69 Illness, unspecified: Secondary | ICD-10-CM | POA: Diagnosis not present

## 2019-02-08 DIAGNOSIS — N83202 Unspecified ovarian cyst, left side: Secondary | ICD-10-CM | POA: Diagnosis not present

## 2019-02-08 DIAGNOSIS — G9341 Metabolic encephalopathy: Secondary | ICD-10-CM | POA: Diagnosis not present

## 2019-02-08 DIAGNOSIS — K812 Acute cholecystitis with chronic cholecystitis: Secondary | ICD-10-CM | POA: Diagnosis not present

## 2019-02-15 DIAGNOSIS — K810 Acute cholecystitis: Secondary | ICD-10-CM | POA: Diagnosis not present

## 2019-02-15 DIAGNOSIS — F419 Anxiety disorder, unspecified: Secondary | ICD-10-CM | POA: Diagnosis not present

## 2019-02-15 DIAGNOSIS — I2510 Atherosclerotic heart disease of native coronary artery without angina pectoris: Secondary | ICD-10-CM | POA: Diagnosis not present

## 2019-02-15 DIAGNOSIS — E119 Type 2 diabetes mellitus without complications: Secondary | ICD-10-CM | POA: Diagnosis not present

## 2019-02-15 DIAGNOSIS — Z48815 Encounter for surgical aftercare following surgery on the digestive system: Secondary | ICD-10-CM | POA: Diagnosis not present

## 2019-02-15 DIAGNOSIS — G9341 Metabolic encephalopathy: Secondary | ICD-10-CM | POA: Diagnosis not present

## 2019-02-15 DIAGNOSIS — R109 Unspecified abdominal pain: Secondary | ICD-10-CM | POA: Diagnosis not present

## 2019-02-15 DIAGNOSIS — F339 Major depressive disorder, recurrent, unspecified: Secondary | ICD-10-CM | POA: Diagnosis not present

## 2019-02-15 DIAGNOSIS — G92 Toxic encephalopathy: Secondary | ICD-10-CM | POA: Diagnosis not present

## 2019-02-15 DIAGNOSIS — R41841 Cognitive communication deficit: Secondary | ICD-10-CM | POA: Diagnosis not present

## 2019-02-15 DIAGNOSIS — J90 Pleural effusion, not elsewhere classified: Secondary | ICD-10-CM | POA: Diagnosis not present

## 2019-02-15 DIAGNOSIS — M1990 Unspecified osteoarthritis, unspecified site: Secondary | ICD-10-CM | POA: Diagnosis not present

## 2019-02-15 DIAGNOSIS — J9811 Atelectasis: Secondary | ICD-10-CM | POA: Diagnosis not present

## 2019-02-15 DIAGNOSIS — E871 Hypo-osmolality and hyponatremia: Secondary | ICD-10-CM | POA: Diagnosis not present

## 2019-02-15 DIAGNOSIS — R5381 Other malaise: Secondary | ICD-10-CM | POA: Diagnosis not present

## 2019-02-15 DIAGNOSIS — R69 Illness, unspecified: Secondary | ICD-10-CM | POA: Diagnosis not present

## 2019-02-15 DIAGNOSIS — J449 Chronic obstructive pulmonary disease, unspecified: Secondary | ICD-10-CM | POA: Diagnosis not present

## 2019-02-15 DIAGNOSIS — R2689 Other abnormalities of gait and mobility: Secondary | ICD-10-CM | POA: Diagnosis not present

## 2019-02-15 DIAGNOSIS — E8770 Fluid overload, unspecified: Secondary | ICD-10-CM | POA: Diagnosis not present

## 2019-02-15 DIAGNOSIS — M6281 Muscle weakness (generalized): Secondary | ICD-10-CM | POA: Diagnosis not present

## 2019-02-15 DIAGNOSIS — E785 Hyperlipidemia, unspecified: Secondary | ICD-10-CM | POA: Diagnosis not present

## 2019-02-15 DIAGNOSIS — K8012 Calculus of gallbladder with acute and chronic cholecystitis without obstruction: Secondary | ICD-10-CM | POA: Diagnosis not present

## 2019-02-15 DIAGNOSIS — F329 Major depressive disorder, single episode, unspecified: Secondary | ICD-10-CM | POA: Diagnosis not present

## 2019-02-15 DIAGNOSIS — I10 Essential (primary) hypertension: Secondary | ICD-10-CM | POA: Diagnosis not present

## 2019-02-15 DIAGNOSIS — Z7401 Bed confinement status: Secondary | ICD-10-CM | POA: Diagnosis not present

## 2019-02-16 DIAGNOSIS — F329 Major depressive disorder, single episode, unspecified: Secondary | ICD-10-CM | POA: Diagnosis not present

## 2019-02-16 DIAGNOSIS — I10 Essential (primary) hypertension: Secondary | ICD-10-CM | POA: Diagnosis not present

## 2019-02-16 DIAGNOSIS — M1990 Unspecified osteoarthritis, unspecified site: Secondary | ICD-10-CM | POA: Diagnosis not present

## 2019-02-16 DIAGNOSIS — E785 Hyperlipidemia, unspecified: Secondary | ICD-10-CM | POA: Diagnosis not present

## 2019-02-22 ENCOUNTER — Other Ambulatory Visit: Payer: Self-pay | Admitting: *Deleted

## 2019-02-22 NOTE — Patient Outreach (Signed)
Member discussed in telephonic IDT meeting with [facility] SNF staff and [facility] UM team.  Member assessed for potential [place] Management program services as a benefit of her [employer] Medicare insurance.  Facility reports member lived alone prior to admission with caregiver assistance. Facility reports family is considering arranging 24/7 caregiver assistance at home post SNF. Staff reports member is currently having some confusion.   Will continue to follow for disposition plans and progression and for potential [facility] Care Management needs.    Marthenia Rolling, MSN-Ed, RN,BSN [place] Acute Care Coordinator [phone] [facility]) [phone]  (Toll free office)

## 2019-02-28 DIAGNOSIS — K8012 Calculus of gallbladder with acute and chronic cholecystitis without obstruction: Secondary | ICD-10-CM | POA: Insufficient documentation

## 2019-03-01 ENCOUNTER — Other Ambulatory Visit: Payer: Self-pay | Admitting: *Deleted

## 2019-03-01 NOTE — Patient Outreach (Signed)
Member assessed for potential [facility] Care Management needs as a benefit of [place] Medicare.  Member is currently at [facility] receiving rehab therapy.   Member discussed in weekly telephonic IDT meeting with facility SNF staff, [facility] UM team, and writer.  Facility staff reports member will discharge home with caregiver assistance. Reportedly Mrs. Brabson's sister is coordinating discharge plans. Writer advised to contact member's sister, Kae Heller regarding [place] Management services. Member has had some confusion.  Will plan to outreach to Kae Heller (sister) on tomorrow to discuss [place] Management program services.    Marthenia Rolling, MSN-Ed, RN,BSN [place] Acute Care Coordinator [phone] [facility]) [phone]  (Toll free office)

## 2019-03-02 ENCOUNTER — Other Ambulatory Visit: Payer: Self-pay | Admitting: *Deleted

## 2019-03-02 NOTE — Patient Outreach (Signed)
Member assessed for potential [facility] Care Management needs as a benefit of [place] Medicare.  Mrs. Grace Howard remains at [facility] receiving rehab therapy.   Telephone call made to member's sister Grace Howard ( as advised by facility). Grace Howard answered the phone. Stated he will have Office manager back on Friday.  Will follow back up with Grace Howard early next week if called not yet received to discuss potential [facility] Care Management needs.   Will continue to collaborate with facility and [facility] UM team on member.   Grace Rolling, MSN-Ed, RN,BSN [place] Acute Care Coordinator [phone] [facility]) [phone]  (Toll free office)

## 2019-03-05 ENCOUNTER — Telehealth (HOSPITAL_COMMUNITY): Payer: Self-pay | Admitting: *Deleted

## 2019-03-05 NOTE — Telephone Encounter (Signed)
Dr Harrington Challenger Patient's sister called patient had to have gall bladder surgery. And while she's been in rehab they have discontinued her Xanax dur to dizziness & unsteadiness. Patients sister asked if there something else she could probably start that is less sedating  & not causing the slurred speech?  Patient will be discharged from Rehab on Thursday & will be back home then.

## 2019-03-05 NOTE — Telephone Encounter (Signed)
LVM FOR Grace Howard PER PROVIDER: We can with can try buspar but pt will need to call me to discuss

## 2019-03-05 NOTE — Telephone Encounter (Signed)
She was on valium NOT xanax. We can with can try buspar but pt will need to call me to discuss

## 2019-03-06 ENCOUNTER — Other Ambulatory Visit: Payer: Self-pay | Admitting: *Deleted

## 2019-03-06 NOTE — Patient Outreach (Signed)
Member assessed for potential [facility] Care Management needs as a benefit of [employer].  Second outreach attempt made to contact Kae Heller (member's sister) regarding [place] Management services.  Telephone call made to Martha's listed mobile phone in system. Her husband answered again. This time he provided writer with [facility] Handler phone number at [phone].  Spoke with Jana Half (member's sister). Patient identifiers confirmed. Jana Half states she is Mrs. Arney's HCPOA and that she is the closet relative that lives near Mrs. Cletus Gash. Jana Half lives in [place] and Mrs. Ezekiel's sons live in [place].   Explained [place] Management services. Jana Half pleasantly declined by stating " I think we have a good set up when she gets home. I do not think we need your services right now. I will call you if that changes."  Writer provided contact information as well as [facility] Care Management information.  Sent notification to [facility] discharge planner to make aware [place] Management services were declined at this time.  Will plan to sign off at this time.   Marthenia Rolling, MSN-Ed, RN,BSN [place] Acute Care Coordinator [phone] [facility]) [phone]  (Toll free office)

## 2019-03-08 DIAGNOSIS — R109 Unspecified abdominal pain: Secondary | ICD-10-CM | POA: Diagnosis not present

## 2019-03-10 DIAGNOSIS — M6281 Muscle weakness (generalized): Secondary | ICD-10-CM | POA: Diagnosis not present

## 2019-03-10 DIAGNOSIS — I4891 Unspecified atrial fibrillation: Secondary | ICD-10-CM | POA: Diagnosis not present

## 2019-03-10 DIAGNOSIS — I10 Essential (primary) hypertension: Secondary | ICD-10-CM | POA: Diagnosis not present

## 2019-03-10 DIAGNOSIS — R41841 Cognitive communication deficit: Secondary | ICD-10-CM | POA: Diagnosis not present

## 2019-03-10 DIAGNOSIS — E119 Type 2 diabetes mellitus without complications: Secondary | ICD-10-CM | POA: Diagnosis not present

## 2019-03-10 DIAGNOSIS — I2510 Atherosclerotic heart disease of native coronary artery without angina pectoris: Secondary | ICD-10-CM | POA: Diagnosis not present

## 2019-03-10 DIAGNOSIS — Z48815 Encounter for surgical aftercare following surgery on the digestive system: Secondary | ICD-10-CM | POA: Diagnosis not present

## 2019-03-10 DIAGNOSIS — J449 Chronic obstructive pulmonary disease, unspecified: Secondary | ICD-10-CM | POA: Diagnosis not present

## 2019-03-10 DIAGNOSIS — R569 Unspecified convulsions: Secondary | ICD-10-CM | POA: Diagnosis not present

## 2019-03-12 ENCOUNTER — Encounter: Payer: Self-pay | Admitting: *Deleted

## 2019-03-12 ENCOUNTER — Other Ambulatory Visit: Payer: Self-pay | Admitting: *Deleted

## 2019-03-12 DIAGNOSIS — I4891 Unspecified atrial fibrillation: Secondary | ICD-10-CM | POA: Diagnosis not present

## 2019-03-12 DIAGNOSIS — I2510 Atherosclerotic heart disease of native coronary artery without angina pectoris: Secondary | ICD-10-CM | POA: Diagnosis not present

## 2019-03-12 DIAGNOSIS — Z48815 Encounter for surgical aftercare following surgery on the digestive system: Secondary | ICD-10-CM | POA: Diagnosis not present

## 2019-03-12 DIAGNOSIS — E119 Type 2 diabetes mellitus without complications: Secondary | ICD-10-CM | POA: Diagnosis not present

## 2019-03-12 DIAGNOSIS — I10 Essential (primary) hypertension: Secondary | ICD-10-CM | POA: Diagnosis not present

## 2019-03-12 DIAGNOSIS — J449 Chronic obstructive pulmonary disease, unspecified: Secondary | ICD-10-CM | POA: Diagnosis not present

## 2019-03-12 NOTE — Patient Outreach (Signed)
  [facility]) Care Management  03/12/2019  CHRISANN MELARAGNO 1936-04-12 [id number]   EMMI-general discharge from [facility]   RED ON [place] Day # 1 Date: Friday 03/09/19 1614  Red Alert Reason: Got discharge papers? no   Insurance:  [employer] medicare  [facility] admissions x  ED visits x in the last 6 months   Outreach attempt # 1 No answer. [facility] RN CM left HIPAA compliant voicemail message along with CM's contact info.   Plan: [facility] RN CM sent an unsuccessful outreach letter and scheduled this patient for another call attempt within 4 business days   Gorge Almanza L. Lavina Hamman, RN, BSN, [place] Coordinator Office number [phone] Mobile number [phone]  Main [facility] number [phone] Fax number [phone]

## 2019-03-12 NOTE — Patient Outreach (Signed)
[facility]) Care Management  03/12/2019  Grace Howard [birth year] [id number]   EMMI-general discharge from [facility] rehabilitation [facility] nursing care center   RED ON EMMI ALERT Day # 1 Date: Friday 03/09/19 1614  Red Alert Reason: Got discharge papers? no   Insurance:  [employer] medicare and blue cross and blue shield supplement [facility] admissions x 1 ED visits x 1 in the last 6 months    Grace Howard, her caregiver returned a call to [facility] RN CM [facility] RN CM returned the call  Grace Howard is able to verify HIPAA Grace Grace Howard, caregiver given permission to speak with [facility] RN CM Reviewed and addressed EMMI red alert/referral to [facility] with patient  EMMI Grace Howard confirms she answered the EMMI questions not Grace Howard and the last call came on today 03/12/19  Grace Howard reports they were only given a list of medications that was reviewed  Grace Howard confirms [facility] pt. [facility] RN Grace Howard) visited last Thursday for the intake and clarified the medications, examined and offered care needs especially related her redden buttocks. A cream "silicone" barrier was applied and directions in the use provided to decrease the redness. Grace Howard states Grace Howard reports a diaper use in snf. The [facility] PT (Grace Howard)  visited today to complete the evaluation    Grace Howard appetite and confusion was noted to have improved. Boost is still being used as nutritional supplement. Grace Howard informed Grace Howard that her appetite was not good at the snf because she did not like the taste of the food. She states Grace Howard reports not appreciating the snf stay related to nursing staff but enjoyed the PT staff, Grace Howard and Grace Howard  She has not had to use oxygen since d/c at home as sat has been around 95+% Grace Howard mentions that the highest noted heart rate was 127 x 1 with activity  [facility] RN CM discussed atrial fibrillation and encouraged that Grace Howard vitals signs especially   Grace Howard reports that Grace  Howard will be assisted to get a new mattress as hers has been noted to not be firm enough for bed mobility and transferring out of bed   Social: Grace Howard lives alone and is widowed. 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 She has the support of her sister and friends. 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 Grace Howard has been assisting Grace Howard for 3 years  Grace Howard is able to get in and out of the shower, take her own bath, dry and dress herself She is able to feed herself. She is independent/assist in her care needs She does not have concerns related to transportation to medical appointments   Conditions: HTN, osteoarthritis HLD, depression, osteoporosis, GERD, pulmonary embolism, CAD hx of seizure disorder, DM COPD, ? atrial fibrillation  DME: oxygen, w/c, pulse oximeter  Medications:  Grace Howard voiced concern that medication reconciliation had to be completed on last with [facility] RN. Dr Sherril Croon is aware Grace Howard states she believes medications had been given wrong at snf   Appointments: 03/15/19 3 pm  Dr Sherril Croon primary care MD  04/11/19 Dr Fraser Din   Advance Directives: Sister Grace Howard is POA   Consent: [facility] RN CM reviewed [facility] services with patient. Patient gave verbal consent for services.  Plan: [facility] RN CM will close case at  this time as patient has been assessed and no needs identified/needs resolved.   Pt encouraged to return a call to [facility] RN CM prn  Grace Jurich L. Lavina Hamman, RN, BSN, [place] Coordinator Office number [phone] Mobile number [phone]  Main [facility] number [phone] Fax number [phone]

## 2019-03-13 ENCOUNTER — Ambulatory Visit: Payer: Medicare Other | Admitting: *Deleted

## 2019-03-13 DIAGNOSIS — J449 Chronic obstructive pulmonary disease, unspecified: Secondary | ICD-10-CM | POA: Diagnosis not present

## 2019-03-13 DIAGNOSIS — I2510 Atherosclerotic heart disease of native coronary artery without angina pectoris: Secondary | ICD-10-CM | POA: Diagnosis not present

## 2019-03-13 DIAGNOSIS — E119 Type 2 diabetes mellitus without complications: Secondary | ICD-10-CM | POA: Diagnosis not present

## 2019-03-13 DIAGNOSIS — I10 Essential (primary) hypertension: Secondary | ICD-10-CM | POA: Diagnosis not present

## 2019-03-13 DIAGNOSIS — Z48815 Encounter for surgical aftercare following surgery on the digestive system: Secondary | ICD-10-CM | POA: Diagnosis not present

## 2019-03-13 DIAGNOSIS — I4891 Unspecified atrial fibrillation: Secondary | ICD-10-CM | POA: Diagnosis not present

## 2019-03-14 DIAGNOSIS — J449 Chronic obstructive pulmonary disease, unspecified: Secondary | ICD-10-CM | POA: Diagnosis not present

## 2019-03-14 DIAGNOSIS — I10 Essential (primary) hypertension: Secondary | ICD-10-CM | POA: Diagnosis not present

## 2019-03-14 DIAGNOSIS — I2510 Atherosclerotic heart disease of native coronary artery without angina pectoris: Secondary | ICD-10-CM | POA: Diagnosis not present

## 2019-03-14 DIAGNOSIS — Z48815 Encounter for surgical aftercare following surgery on the digestive system: Secondary | ICD-10-CM | POA: Diagnosis not present

## 2019-03-14 DIAGNOSIS — I4891 Unspecified atrial fibrillation: Secondary | ICD-10-CM | POA: Diagnosis not present

## 2019-03-14 DIAGNOSIS — E119 Type 2 diabetes mellitus without complications: Secondary | ICD-10-CM | POA: Diagnosis not present

## 2019-03-15 ENCOUNTER — Telehealth (HOSPITAL_COMMUNITY): Payer: Self-pay

## 2019-03-15 DIAGNOSIS — I4891 Unspecified atrial fibrillation: Secondary | ICD-10-CM | POA: Diagnosis not present

## 2019-03-15 DIAGNOSIS — I2699 Other pulmonary embolism without acute cor pulmonale: Secondary | ICD-10-CM | POA: Diagnosis not present

## 2019-03-15 DIAGNOSIS — E1165 Type 2 diabetes mellitus with hyperglycemia: Secondary | ICD-10-CM | POA: Diagnosis not present

## 2019-03-15 DIAGNOSIS — M329 Systemic lupus erythematosus, unspecified: Secondary | ICD-10-CM | POA: Diagnosis not present

## 2019-03-15 DIAGNOSIS — Z299 Encounter for prophylactic measures, unspecified: Secondary | ICD-10-CM | POA: Diagnosis not present

## 2019-03-15 DIAGNOSIS — Z6826 Body mass index (BMI) 26.0-26.9, adult: Secondary | ICD-10-CM | POA: Diagnosis not present

## 2019-03-15 NOTE — Telephone Encounter (Signed)
Patient's sister called. Sister stated the patient had just gotten home from the hospital and was advised not to give patient the Valium 2mg  that was sent in for her on 5/27. Patient's sister stated problems with it such as it makes her unsteady on her feet, slurred speech, and slumps to the floor unable to get up and in a stooper. The sister would like to speak with the doctor regarding patient's situation and a possible replacement for the medication. She can be reached at [phone] or [phone]. Thank you.

## 2019-03-16 ENCOUNTER — Other Ambulatory Visit (HOSPITAL_COMMUNITY): Payer: Self-pay | Admitting: Psychiatry

## 2019-03-16 MED ORDER — BUSPIRONE HCL 5 MG PO TABS: 5.0000 mg | ORAL_TABLET | Freq: Three times a day (TID) | ORAL | 2 refills | Status: DC

## 2019-03-16 NOTE — Telephone Encounter (Signed)
Discussed with sister, valium d/ced and buspar 5 mg tid called in. Please call [facility] to d/c further refills of valium

## 2019-03-19 DIAGNOSIS — I10 Essential (primary) hypertension: Secondary | ICD-10-CM | POA: Diagnosis not present

## 2019-03-19 DIAGNOSIS — E119 Type 2 diabetes mellitus without complications: Secondary | ICD-10-CM | POA: Diagnosis not present

## 2019-03-19 DIAGNOSIS — Z48815 Encounter for surgical aftercare following surgery on the digestive system: Secondary | ICD-10-CM | POA: Diagnosis not present

## 2019-03-19 DIAGNOSIS — I4891 Unspecified atrial fibrillation: Secondary | ICD-10-CM | POA: Diagnosis not present

## 2019-03-19 DIAGNOSIS — J449 Chronic obstructive pulmonary disease, unspecified: Secondary | ICD-10-CM | POA: Diagnosis not present

## 2019-03-19 DIAGNOSIS — I2510 Atherosclerotic heart disease of native coronary artery without angina pectoris: Secondary | ICD-10-CM | POA: Diagnosis not present

## 2019-03-19 NOTE — Telephone Encounter (Signed)
Medication Management - Telephone call with [place] at [facility] to make sure all remaining orders for Valium were discontinued per Dr. Harrington Challenger instruction and collateral verified this had been done.

## 2019-03-20 DIAGNOSIS — Z48815 Encounter for surgical aftercare following surgery on the digestive system: Secondary | ICD-10-CM | POA: Diagnosis not present

## 2019-03-20 DIAGNOSIS — I10 Essential (primary) hypertension: Secondary | ICD-10-CM | POA: Diagnosis not present

## 2019-03-20 DIAGNOSIS — I4891 Unspecified atrial fibrillation: Secondary | ICD-10-CM | POA: Diagnosis not present

## 2019-03-20 DIAGNOSIS — E119 Type 2 diabetes mellitus without complications: Secondary | ICD-10-CM | POA: Diagnosis not present

## 2019-03-20 DIAGNOSIS — J449 Chronic obstructive pulmonary disease, unspecified: Secondary | ICD-10-CM | POA: Diagnosis not present

## 2019-03-20 DIAGNOSIS — I2510 Atherosclerotic heart disease of native coronary artery without angina pectoris: Secondary | ICD-10-CM | POA: Diagnosis not present

## 2019-03-21 ENCOUNTER — Other Ambulatory Visit: Payer: Self-pay

## 2019-03-21 ENCOUNTER — Encounter (HOSPITAL_COMMUNITY): Payer: Self-pay | Admitting: Psychiatry

## 2019-03-21 ENCOUNTER — Ambulatory Visit (INDEPENDENT_AMBULATORY_CARE_PROVIDER_SITE_OTHER): Payer: Medicare Other | Admitting: Psychiatry

## 2019-03-21 DIAGNOSIS — E119 Type 2 diabetes mellitus without complications: Secondary | ICD-10-CM | POA: Diagnosis not present

## 2019-03-21 DIAGNOSIS — Z48815 Encounter for surgical aftercare following surgery on the digestive system: Secondary | ICD-10-CM | POA: Diagnosis not present

## 2019-03-21 DIAGNOSIS — I2510 Atherosclerotic heart disease of native coronary artery without angina pectoris: Secondary | ICD-10-CM | POA: Diagnosis not present

## 2019-03-21 DIAGNOSIS — F331 Major depressive disorder, recurrent, moderate: Secondary | ICD-10-CM | POA: Diagnosis not present

## 2019-03-21 DIAGNOSIS — J449 Chronic obstructive pulmonary disease, unspecified: Secondary | ICD-10-CM | POA: Diagnosis not present

## 2019-03-21 DIAGNOSIS — I10 Essential (primary) hypertension: Secondary | ICD-10-CM | POA: Diagnosis not present

## 2019-03-21 DIAGNOSIS — I4891 Unspecified atrial fibrillation: Secondary | ICD-10-CM | POA: Diagnosis not present

## 2019-03-21 MED ORDER — BUSPIRONE HCL 10 MG PO TABS: 10.0000 mg | ORAL_TABLET | Freq: Three times a day (TID) | ORAL | 2 refills | Status: DC

## 2019-03-21 MED ORDER — TRAZODONE HCL 150 MG PO TABS: 150.0000 mg | ORAL_TABLET | Freq: Every day | ORAL | 2 refills | Status: DC

## 2019-03-21 MED ORDER — DULOXETINE HCL 60 MG PO CPEP: 60.0000 mg | ORAL_CAPSULE | Freq: Two times a day (BID) | ORAL | 3 refills | Status: DC

## 2019-03-21 MED ORDER — ARIPIPRAZOLE 10 MG PO TABS: 15.0000 mg | ORAL_TABLET | Freq: Every day | ORAL | 3 refills | Status: DC

## 2019-03-21 NOTE — Progress Notes (Signed)
Virtual Visit via Telephone Note  I connected with Grace Howard on 03/21/19 at  3:20 PM EDT by telephone and verified that I am speaking with the correct person using two identifiers.   I discussed the limitations, risks, security and privacy concerns of performing an evaluation and management service by telephone and the availability of in person appointments. I also discussed with the patient that there may be a patient responsible charge related to this service. The patient expressed understanding and agreed to proceed.    I discussed the assessment and treatment plan with the patient. The patient was provided an opportunity to ask questions and all were answered. The patient agreed with the plan and demonstrated an understanding of the instructions.   The patient was advised to call back or seek an in-person evaluation if the symptoms worsen or if the condition fails to improve as anticipated.  I provided 15 minutes of non-face-to-face time during this encounter.   Grace Rudereborah Ross, MD  [facility] MD/PA/NP OP Progress Note  03/21/2019 3:43 PM Grace JordanLucy R Grisso  MRN:  [id number]  Chief Complaint:  Chief Complaint    Depression; Anxiety; Follow-up     HPI: This patient is an 83 year old female who livesalonein [place]. She has a sister in [place] and 2 sons who live in [place] in [place]. 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  The patient states that she's had depression for many years. She was in a bad marriage in the 6070s and was [facility]. She's been seeing psychiatrist treatment ever since then in the most part she's been fairly stable recently. However it she's worried about several health issues. She had a breast cyst which turned out to be benign. She has thyroid nodules but no change in her thyroid hormone. She also has skin cancer in the thickening of her uterine lining. All these things have been getting her worried. She is only on 2  mg of Valium per day and would like an increase because she is very anxious. Her mood is fairly stable but she tends to stay to herself a lot. She denies crying spells panic attacks suicidal ideation her appetite change  The patient returns for follow-up after long absence.  She was last seen about 6 months ago.  Since then she has had gallbladder surgery last month.  Following that she was in a rehab center.  During that time her sister called and said that the Valium 2 mg was making her very woozy unsteady and out of it.  I have since discontinued it in favor of BuSpar 5 mg 3 times daily for anxiety.  The patient states today that she is back home and doing okay.  She has physical therapy coming in several times a week.  She has a caregiver who is there every day.  She is sleeping okay and the BuSpar is helping her anxiety to some degree but she does not think it is enough.  She only sleeps 4 to 5 hours and then wakes up.  She is trying to practice her walking every day and get a little bit more active.  She denies being seriously depressed.  She does think that she needs to increase the BuSpar because she is still anxious and having trouble getting to sleep at night  Visit Diagnosis:    ICD-10-CM   1. Major depressive disorder, recurrent episode, moderate (HCC)  F33.1     Past Psychiatric History: 2 psychiatric admissions in  the 81s for depression  Past Medical History:  Past Medical History:  Diagnosis Date  . Anxiety   . Depression   . GERD (gastroesophageal reflux disease)   . Headache(784.0)   . HTN (hypertension)   . Hyperlipidemia   . Insomnia   . Lupus (HCC)   . Macular degeneration June 2013  . Osteoarthritis June 2013  . Osteoporosis   . PE (pulmonary embolism) 2008   not sure why  . Seizures (HCC)   . Wears glasses     Past Surgical History:  Procedure Laterality Date  . abdomnial surgery    . ANKLE FRACTURE SURGERY  2000   right  . APPENDECTOMY    . CARDIAC  CATHETERIZATION  2008   PE-filter in  . COLONOSCOPY    . DILITATION & CURRETTAGE/HYSTROSCOPY WITH ESSURE    . EYE SURGERY     both cataracts  . HARDWARE REMOVAL Left 02/26/2014   Procedure: REMOVAL HARDWARE ;  Surgeon: Tami Ribas, MD;  Location: [facility];  Service: Orthopedics;  Laterality: Left;  . TONSILLECTOMY    . WRIST SURGERY  2011   may-lt    Family Psychiatric History: See below  Family History:  Family History  Problem Relation Age of Onset  . Dementia Father   . ADD / ADHD Other   . ADD / ADHD Other   . Healthy Son   . Healthy Son   . Depression Sister   . Anxiety disorder Sister   . Sexual abuse Sister   . Alcohol abuse Paternal Aunt   . Drug abuse Paternal Uncle   . Bipolar disorder Neg Hx   . OCD Neg Hx   . Paranoid behavior Neg Hx   . Schizophrenia Neg Hx   . Seizures Neg Hx   . Physical abuse Neg Hx     Social History:  Social History   Socioeconomic History  . Marital status: Widowed    Spouse name: Not on file  . Number of children: Not on file  . Years of education: Not on file  . Highest education level: Not on file  Occupational History  . Occupation: retired Retail buyer   Social Needs  . Financial resource strain: Not hard at all  . Food insecurity    Worry: Never true    Inability: Never true  . Transportation needs    Medical: No    Non-medical: No  Tobacco Use  . Smoking status: Former Smoker    Quit date: 02/19/1978    Years since quitting: 41.1  . Smokeless tobacco: Never Used  Substance and Sexual Activity  . Alcohol use: No  . Drug use: No  . Sexual activity: Never  Lifestyle  . Physical activity    Days per week: Not on file    Minutes per session: Not on file  . Stress: Not on file  Relationships  . Social Musician on phone: Not on file    Gets together: Not on file    Attends religious service: Not on file    Active member of club or organization: Not on file    Attends meetings  of clubs or organizations: Not on file    Relationship status: Not on file  Other Topics Concern  . Not on file  Social History Narrative    Mrs Cure lives alone and is widowed. Mrs Sol was an Retail buyer. She has the support of her sister and friends. [name]  is her caregiver  Albin FellingCarla has been assisting [name] for 3 years     Allergies:  Allergies  Allergen Reactions  . Tegretol [Carbamazepine] Other (See Comments)    Thought to have caused her Lupus    Metabolic Disorder Labs: Lab Results  Component Value Date   HGBA1C 5.7 (H) 02/15/2012   MPG 117 (H) 02/15/2012   No results found for: PROLACTIN No results found for: CHOL, TRIG, HDL, CHOLHDL, VLDL, LDLCALC No results found for: TSH  Therapeutic Level Labs: No results found for: LITHIUM No results found for: VALPROATE No components found for:  CBMZ  Current Medications: Current Outpatient Medications  Medication Sig Dispense Refill  . amLODipine (NORVASC) 5 MG tablet Take 5 mg by mouth daily.    . ARIPiprazole (ABILIFY) 10 MG tablet Take 1.5 tablets (15 mg total) by mouth daily. 45 tablet 3  . benazepril (LOTENSIN) 20 MG tablet     . busPIRone (BUSPAR) 10 MG tablet Take 1 tablet (10 mg total) by mouth 3 (three) times daily. 90 tablet 2  . Calcium Carbonate-Vitamin D (CALCIUM + D PO) Take by mouth 2 (two) times daily.    . celecoxib (CELEBREX) 100 MG capsule Take 1 capsule by mouth twice daily 60 capsule 0  . diclofenac sodium (VOLTAREN) 1 % GEL APPLY 3GM TWICE DAILY  2  . DULoxetine (CYMBALTA) 60 MG capsule Take 1 capsule (60 mg total) by mouth 2 (two) times daily. 60 capsule 3  . hydroxychloroquine (PLAQUENIL) 200 MG tablet Take 300 mg by mouth daily.     Marland Kitchen. levETIRAcetam (KEPPRA) 500 MG tablet Take 500 mg by mouth daily.     . metoprolol tartrate (LOPRESSOR) 25 MG tablet Take 25 mg by mouth 2 (two) times daily.    . Multiple Vitamin (MULTIVITAMIN) capsule Take 1 capsule by mouth daily.    . Multiple  Vitamins-Minerals (HM COMPLETE 50+ MENS ULTIMATE PO) TAKE ONE TABLET BY MOUTH DAILY  12  . Multiple Vitamins-Minerals (PRESERVISION AREDS) TABS Take 1 tablet by mouth daily.  12  . omega-3 acid ethyl esters (LOVAZA) 1 G capsule Take 2 g by mouth daily.    . pantoprazole (PROTONIX) 40 MG tablet Take 40 mg by mouth daily.    . pravastatin (PRAVACHOL) 80 MG tablet Take 80 mg by mouth daily.    Marland Kitchen. tiZANidine (ZANAFLEX) 2 MG tablet Take 2 mg by mouth 2 (two) times daily as needed for muscle spasms.    . traMADol (ULTRAM) 50 MG tablet     . traZODone (DESYREL) 150 MG tablet Take 1 tablet (150 mg total) by mouth at bedtime. 30 tablet 2  . warfarin (COUMADIN) 2 MG tablet Take by mouth as directed.      No current facility-administered medications for this visit.      Musculoskeletal: Strength & Muscle Tone: decreased Gait & Station: unsteady Patient leans: N/A  Psychiatric Specialty Exam: Review of Systems  Musculoskeletal: Positive for back pain and joint pain.  Psychiatric/Behavioral: The patient is nervous/anxious.   All other systems reviewed and are negative.   There were no vitals taken for this visit.There is no height or weight on file to calculate BMI.  General Appearance: NA  Eye Contact:  NA  Speech:  Clear and Coherent  Volume:  Normal  Mood:  Anxious  Affect:  NA  Thought Process:  Goal Directed  Orientation:  Full (Time, Place, and Person)  Thought Content: Logical   Suicidal Thoughts:  No  Homicidal Thoughts:  No  Memory:  Immediate;   Good Recent;   Fair Remote;   NA  Judgement:  Fair  Insight:  Fair  Psychomotor Activity:  Decreased  Concentration:  Concentration: Fair and Attention Span: Fair  Recall:  [employer] of Knowledge: Good  Language: Good  Akathisia:  No  Handed:  Right  AIMS (if indicated): not done  Assets:  Communication Skills Desire for Improvement Resilience Social Support  ADL's:  Intact  Cognition: WNL  Sleep:  Fair    Screenings: PHQ2-9     Patient Outreach Telephone from 03/12/2019 in [place]  PHQ-2 Total Score  0       Assessment and Plan: This patient is an 83 year old female with a long history of depression and anxiety.  She has had a lot of medical issues over the last year plus the death of her husband but she has been coping fairly well.  The BuSpar is helping her anxiety to some degree but not entirely.  We will increase this to 10 mg 3 times daily.  She will continue trazodone 150 mg at bedtime, Abilify 15 mg daily for augmentation,cymbalta 60 mg bid for depression. She will rtc 2 months   Levonne Spiller, MD 03/21/2019, 3:43 PM

## 2019-03-23 ENCOUNTER — Ambulatory Visit: Payer: Medicare Other | Admitting: Cardiology

## 2019-03-27 DIAGNOSIS — J449 Chronic obstructive pulmonary disease, unspecified: Secondary | ICD-10-CM | POA: Diagnosis not present

## 2019-03-27 DIAGNOSIS — I10 Essential (primary) hypertension: Secondary | ICD-10-CM | POA: Diagnosis not present

## 2019-03-27 DIAGNOSIS — I4891 Unspecified atrial fibrillation: Secondary | ICD-10-CM | POA: Diagnosis not present

## 2019-03-27 DIAGNOSIS — I2510 Atherosclerotic heart disease of native coronary artery without angina pectoris: Secondary | ICD-10-CM | POA: Diagnosis not present

## 2019-03-27 DIAGNOSIS — E119 Type 2 diabetes mellitus without complications: Secondary | ICD-10-CM | POA: Diagnosis not present

## 2019-03-27 DIAGNOSIS — Z48815 Encounter for surgical aftercare following surgery on the digestive system: Secondary | ICD-10-CM | POA: Diagnosis not present

## 2019-03-27 DIAGNOSIS — Z299 Encounter for prophylactic measures, unspecified: Secondary | ICD-10-CM | POA: Diagnosis not present

## 2019-03-27 DIAGNOSIS — Z713 Dietary counseling and surveillance: Secondary | ICD-10-CM | POA: Diagnosis not present

## 2019-03-27 DIAGNOSIS — R42 Dizziness and giddiness: Secondary | ICD-10-CM | POA: Diagnosis not present

## 2019-03-27 DIAGNOSIS — Z6825 Body mass index (BMI) 25.0-25.9, adult: Secondary | ICD-10-CM | POA: Diagnosis not present

## 2019-03-28 DIAGNOSIS — J449 Chronic obstructive pulmonary disease, unspecified: Secondary | ICD-10-CM | POA: Diagnosis not present

## 2019-03-28 DIAGNOSIS — E119 Type 2 diabetes mellitus without complications: Secondary | ICD-10-CM | POA: Diagnosis not present

## 2019-03-28 DIAGNOSIS — I4891 Unspecified atrial fibrillation: Secondary | ICD-10-CM | POA: Diagnosis not present

## 2019-03-28 DIAGNOSIS — I2510 Atherosclerotic heart disease of native coronary artery without angina pectoris: Secondary | ICD-10-CM | POA: Diagnosis not present

## 2019-03-28 DIAGNOSIS — Z48815 Encounter for surgical aftercare following surgery on the digestive system: Secondary | ICD-10-CM | POA: Diagnosis not present

## 2019-03-28 DIAGNOSIS — I10 Essential (primary) hypertension: Secondary | ICD-10-CM | POA: Diagnosis not present

## 2019-04-02 ENCOUNTER — Telehealth (HOSPITAL_COMMUNITY): Payer: Self-pay

## 2019-04-02 ENCOUNTER — Other Ambulatory Visit (HOSPITAL_COMMUNITY): Payer: Self-pay | Admitting: Psychiatry

## 2019-04-02 MED ORDER — BUSPIRONE HCL 5 MG PO TABS: 5.0000 mg | ORAL_TABLET | Freq: Three times a day (TID) | ORAL | 2 refills | Status: DC

## 2019-04-02 NOTE — Telephone Encounter (Signed)
Suggest going back to buspar 5 mg tid, script sent

## 2019-04-02 NOTE — Telephone Encounter (Signed)
Medication management - Called patient's home back to inform of Dr. Harrington Challenger' decrease in Buspar medication and that a new order was sent in for 5 mg three times a day. If this does not help, pt will call back for an earlier appointment if needed.

## 2019-04-02 NOTE — Telephone Encounter (Signed)
Medication problem - Telephone call with pt and her caregiver, Suzy Bouchard whom patient allowed to speak for her too, as both reported since pt went up to 10 mg on Buspar she is having more "severe headaches" and the medication is not helping her anxiety.  Ms. Mariea Clonts stated patient has a lot going on in her life currently and just isn't doing as well.  Reports she is now getting migraines again and questions if this could be due to medication.  Agreed to send concerns to Dr. Harrington Challenger as patient agreed with this plan.

## 2019-04-04 DIAGNOSIS — E119 Type 2 diabetes mellitus without complications: Secondary | ICD-10-CM | POA: Diagnosis not present

## 2019-04-04 DIAGNOSIS — I10 Essential (primary) hypertension: Secondary | ICD-10-CM | POA: Diagnosis not present

## 2019-04-04 DIAGNOSIS — I4891 Unspecified atrial fibrillation: Secondary | ICD-10-CM | POA: Diagnosis not present

## 2019-04-04 DIAGNOSIS — J449 Chronic obstructive pulmonary disease, unspecified: Secondary | ICD-10-CM | POA: Diagnosis not present

## 2019-04-04 DIAGNOSIS — I2510 Atherosclerotic heart disease of native coronary artery without angina pectoris: Secondary | ICD-10-CM | POA: Diagnosis not present

## 2019-04-04 DIAGNOSIS — Z48815 Encounter for surgical aftercare following surgery on the digestive system: Secondary | ICD-10-CM | POA: Diagnosis not present

## 2019-04-05 ENCOUNTER — Encounter (HOSPITAL_COMMUNITY): Payer: Self-pay | Admitting: Psychiatry

## 2019-04-05 ENCOUNTER — Ambulatory Visit (INDEPENDENT_AMBULATORY_CARE_PROVIDER_SITE_OTHER): Payer: Medicare Other | Admitting: Psychiatry

## 2019-04-05 ENCOUNTER — Other Ambulatory Visit: Payer: Self-pay

## 2019-04-05 DIAGNOSIS — Z87891 Personal history of nicotine dependence: Secondary | ICD-10-CM | POA: Diagnosis not present

## 2019-04-05 DIAGNOSIS — F331 Major depressive disorder, recurrent, moderate: Secondary | ICD-10-CM | POA: Diagnosis not present

## 2019-04-05 MED ORDER — HYDROXYZINE HCL 50 MG PO TABS: ORAL_TABLET | ORAL | 2 refills | Status: DC

## 2019-04-05 MED ORDER — LORAZEPAM 0.5 MG PO TABS: 0.5000 mg | ORAL_TABLET | Freq: Two times a day (BID) | ORAL | 2 refills | Status: DC | PRN

## 2019-04-05 NOTE — Progress Notes (Signed)
Virtual Visit via Telephone Note  I connected with Grace Howard on 04/05/19 at  2:20 PM EDT by telephone and verified that I am speaking with the correct person using two identifiers.   I discussed the limitations, risks, security and privacy concerns of performing an evaluation and management service by telephone and the availability of in person appointments. I also discussed with the patient that there may be a patient responsible charge related to this service. The patient expressed understanding and agreed to proceed.     I discussed the assessment and treatment plan with the patient. The patient was provided an opportunity to ask questions and all were answered. The patient agreed with the plan and demonstrated an understanding of the instructions.   The patient was advised to call back or seek an in-person evaluation if the symptoms worsen or if the condition fails to improve as anticipated.  I provided 15 minutes of non-face-to-face time during this encounter.   Grace Rudereborah Kayden Amend, MD  [facility] OP Progress Note  04/05/2019 2:44 PM Grace JordanLucy R Ibach  MRN:  [id number]  Chief Complaint:  Chief Complaint    Depression; Anxiety; Follow-up     NWG:NFAOHPI:This patient is a83 year oldfemale who livesalonein [place]. She has a sister in [place] and 2 sons who live in [place] in [place]. 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  The patient states that she's had depression for many years. She was in a bad marriage in the 4181s and was hospitalized at [facility]. She's been seeing psychiatrist treatment ever since then in the most part she's been fairly stable recently. However it she's worried about several health issues. She had a breast cyst which turned out to be benign. She has thyroid nodules but no change in her thyroid hormone. She also has skin cancer in the thickening of her uterine lining. All these things have been getting her worried. She is only on 2  mg of Valium per day and would like an increase because she is very anxious. Her mood is fairly stable but she tends to stay to herself a lot. She denies crying spells panic attacks suicidal ideation her appetite change  Patient returns after 2 weeks.  She is a work in today because she is not doing well.  She states that she is not able to sleep and is very worried.  Her son in [place] has severe depression and she is worried about him.  She also has to take a cognitive test at her primary care's office next week and she is very worried that she will do poorly.  She realizes that her memory is not all that great and her caregiver Grace Howard states that the same.  She is only sleeping 1 or 2 hours at night and stays very anxious during the day.  I suggested we change trazodone to hydroxyzine to see if this will help her sleep.  BuSpar has not helped her and Valium was too strong so we will just use a tiny dosage of Ativan to help her anxiety.  She denies suicidal ideation   Visit Diagnosis:    ICD-10-CM   1. Major depressive disorder, recurrent episode, moderate (HCC)  F33.1     Past Psychiatric History: 2 psychiatric admissions in the 4181s for depression  Past Medical History:  Past Medical History:  Diagnosis Date  . Anxiety   . Depression   . GERD (gastroesophageal reflux disease)   . Headache(784.0)   . HTN (  hypertension)   . Hyperlipidemia   . Insomnia   . Lupus (HCC)   . Macular degeneration June 2013  . Osteoarthritis June 2013  . Osteoporosis   . PE (pulmonary embolism) 2008   not sure why  . Seizures (HCC)   . Wears glasses     Past Surgical History:  Procedure Laterality Date  . abdomnial surgery    . ANKLE FRACTURE SURGERY  2000   right  . APPENDECTOMY    . CARDIAC CATHETERIZATION  2008   PE-filter in  . COLONOSCOPY    . DILITATION & CURRETTAGE/HYSTROSCOPY WITH ESSURE    . EYE SURGERY     both cataracts  . HARDWARE REMOVAL Left 02/26/2014   Procedure: REMOVAL  HARDWARE ;  Surgeon: Tami RibasKevin R Kuzma, MD;  Location:  S[facility]ERY [facility];  Service: Orthopedics;  Laterality: Left;  . TONSILLECTOMY    . WRIST SURGERY  2011   may-lt    Family Psychiatric History: see below  Family History:  Family History  Problem Relation Age of Onset  . Dementia Father   . ADD / ADHD Other   . ADD / ADHD Other   . Healthy Son   . Healthy Son   . Depression Sister   . Anxiety disorder Sister   . Sexual abuse Sister   . Alcohol abuse Paternal Aunt   . Drug abuse Paternal Uncle   . Bipolar disorder Neg Hx   . OCD Neg Hx   . Paranoid behavior Neg Hx   . Schizophrenia Neg Hx   . Seizures Neg Hx   . Physical abuse Neg Hx     Social History:  Social History   Socioeconomic History  . Marital status: Widowed    Spouse name: Not on file  . Number of children: Not on file  . Years of education: Not on file  . Highest education level: Not on file  Occupational History  . 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 Financial resource strain: Not hard at all  . Food insecurity    Worry: Never true    Inability: Never true  . Transportation needs    Medical: No    Non-medical: No  Tobacco Use  . Smoking status: Former Smoker    Quit date: 02/19/1978    Years since quitting: 41.1  . Smokeless tobacco: Never Used  Substance and Sexual Activity  . Alcohol use: No  . Drug use: No  . Sexual activity: Never  Lifestyle  . Physical activity    Days per week: Not on file    Minutes per session: Not on file  . Stress: Not on file  Relationships  . Social Musicianconnections    Talks on phone: Not on file    Gets together: Not on file    Attends religious service: Not on file    Active member of club or organization: Not on file    Attends meetings of clubs or organizations: Not on file    Relationship status: Not on file  Other Topics Concern  . Not on file  Social History Narrative    Grace Howard lives alone and is widowed. 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 She has the support of her sister and friends. Grace Howard is her caregiver  Grace Howard has been assisting Grace Howard for 3 years     Allergies:  Allergies  Allergen Reactions  . Tegretol [Carbamazepine] Other (See Comments)    Thought to have caused  her Lupus    Metabolic Disorder Labs: Lab Results  Component Value Date   HGBA1C 5.7 (H) 02/15/2012   MPG 117 (H) 02/15/2012   No results found for: PROLACTIN No results found for: CHOL, TRIG, HDL, CHOLHDL, VLDL, LDLCALC No results found for: TSH  Therapeutic Level Labs: No results found for: LITHIUM No results found for: VALPROATE No components found for:  CBMZ  Current Medications: Current Outpatient Medications  Medication Sig Dispense Refill  . amLODipine (NORVASC) 5 MG tablet Take 5 mg by mouth daily.    . ARIPiprazole (ABILIFY) 10 MG tablet Take 1.5 tablets (15 mg total) by mouth daily. 45 tablet 3  . benazepril (LOTENSIN) 20 MG tablet     . Calcium Carbonate-Vitamin D (CALCIUM + D PO) Take by mouth 2 (two) times daily.    . celecoxib (CELEBREX) 100 MG capsule Take 1 capsule by mouth twice daily 60 capsule 0  . diclofenac sodium (VOLTAREN) 1 % GEL APPLY 3GM TWICE DAILY  2  . DULoxetine (CYMBALTA) 60 MG capsule Take 1 capsule (60 mg total) by mouth 2 (two) times daily. 60 capsule 3  . hydroxychloroquine (PLAQUENIL) 200 MG tablet Take 300 mg by mouth daily.     . hydrOXYzine (ATARAX/VISTARIL) 50 MG tablet Take one or two at bedtime for sleep 60 tablet 2  . levETIRAcetam (KEPPRA) 500 MG tablet Take 500 mg by mouth daily.     Marland Kitchen LORazepam (ATIVAN) 0.5 MG tablet Take 1 tablet (0.5 mg total) by mouth 2 (two) times daily as needed for anxiety. 60 tablet 2  . metoprolol tartrate (LOPRESSOR) 25 MG tablet Take 25 mg by mouth 2 (two) times daily.    . Multiple Vitamin (MULTIVITAMIN) capsule Take 1 capsule by mouth daily.    . Multiple Vitamins-Minerals (HM COMPLETE 50+ MENS ULTIMATE PO) TAKE ONE TABLET BY MOUTH DAILY  12  .  Multiple Vitamins-Minerals (PRESERVISION AREDS) TABS Take 1 tablet by mouth daily.  12  . omega-3 acid ethyl esters (LOVAZA) 1 G capsule Take 2 g by mouth daily.    . pantoprazole (PROTONIX) 40 MG tablet Take 40 mg by mouth daily.    . pravastatin (PRAVACHOL) 80 MG tablet Take 80 mg by mouth daily.    Marland Kitchen tiZANidine (ZANAFLEX) 2 MG tablet Take 2 mg by mouth 2 (two) times daily as needed for muscle spasms.    . traMADol (ULTRAM) 50 MG tablet     . warfarin (COUMADIN) 2 MG tablet Take by mouth as directed.      No current facility-administered medications for this visit.      Musculoskeletal: Strength & Muscle Tone: decreased Gait & Station: unsteady Patient leans: N/A  Psychiatric Specialty Exam: Review of Systems  Musculoskeletal: Positive for back pain and joint pain.  Psychiatric/Behavioral: Positive for memory loss. The patient is nervous/anxious and has insomnia.   All other systems reviewed and are negative.   There were no vitals taken for this visit.There is no height or weight on file to calculate BMI.  General Appearance: NA  Eye Contact:  NA  Speech:  Clear and Coherent  Volume:  Normal  Mood:  Anxious  Affect:  NA  Thought Process:  Goal Directed  Orientation:  Full (Time, Place, and Person)  Thought Content: Rumination   Suicidal Thoughts:  No  Homicidal Thoughts:  No  Memory:  Immediate;   Good Recent;   Fair Remote;   NA  Judgement:  Fair  Insight:  Fair  Psychomotor Activity:  Decreased  Concentration:  Concentration: Fair and Attention Span: Fair  Recall:  Poor  Fund of Knowledge: Good  Language: Good  Akathisia:  No  Handed:  Right  AIMS (if indicated): not done  Assets:  Communication Skills Desire for Improvement Resilience Social Support Talents/Skills  ADL's:  Intact  Cognition: WNL  Sleep:  Poor   Screenings: PHQ2-9     Patient Outreach Telephone from 03/12/2019 in [facility]  PHQ-2 Total Score  0       Assessment and  Plan: This patient is an 83 year old female with a long history of depression and anxiety.  For numerous reasons her anxiety is worse so we will discontinue BuSpar and start Ativan 0.25 to 0.5 mg twice daily as needed for anxiety.  She is not sleeping well so we will discontinue trazodone in favor of hydroxyzine 50 to 100 mg at bedtime.  She will continue Cymbalta 60 mg twice a day for depression and Abilify 15 mg daily for augmentation.  She will return to see me in 3 weeks   Grace Ruder, MD 04/05/2019, 2:44 PM

## 2019-04-09 DIAGNOSIS — Z48815 Encounter for surgical aftercare following surgery on the digestive system: Secondary | ICD-10-CM | POA: Diagnosis not present

## 2019-04-09 DIAGNOSIS — J449 Chronic obstructive pulmonary disease, unspecified: Secondary | ICD-10-CM | POA: Diagnosis not present

## 2019-04-09 DIAGNOSIS — I10 Essential (primary) hypertension: Secondary | ICD-10-CM | POA: Diagnosis not present

## 2019-04-09 DIAGNOSIS — R569 Unspecified convulsions: Secondary | ICD-10-CM | POA: Diagnosis not present

## 2019-04-09 DIAGNOSIS — I2510 Atherosclerotic heart disease of native coronary artery without angina pectoris: Secondary | ICD-10-CM | POA: Diagnosis not present

## 2019-04-09 DIAGNOSIS — E119 Type 2 diabetes mellitus without complications: Secondary | ICD-10-CM | POA: Diagnosis not present

## 2019-04-09 DIAGNOSIS — M6281 Muscle weakness (generalized): Secondary | ICD-10-CM | POA: Diagnosis not present

## 2019-04-09 DIAGNOSIS — I4891 Unspecified atrial fibrillation: Secondary | ICD-10-CM | POA: Diagnosis not present

## 2019-04-09 DIAGNOSIS — R41841 Cognitive communication deficit: Secondary | ICD-10-CM | POA: Diagnosis not present

## 2019-04-10 DIAGNOSIS — Z48815 Encounter for surgical aftercare following surgery on the digestive system: Secondary | ICD-10-CM | POA: Diagnosis not present

## 2019-04-10 DIAGNOSIS — I4891 Unspecified atrial fibrillation: Secondary | ICD-10-CM | POA: Diagnosis not present

## 2019-04-10 DIAGNOSIS — E119 Type 2 diabetes mellitus without complications: Secondary | ICD-10-CM | POA: Diagnosis not present

## 2019-04-10 DIAGNOSIS — I2510 Atherosclerotic heart disease of native coronary artery without angina pectoris: Secondary | ICD-10-CM | POA: Diagnosis not present

## 2019-04-10 DIAGNOSIS — I10 Essential (primary) hypertension: Secondary | ICD-10-CM | POA: Diagnosis not present

## 2019-04-10 DIAGNOSIS — J449 Chronic obstructive pulmonary disease, unspecified: Secondary | ICD-10-CM | POA: Diagnosis not present

## 2019-04-12 DIAGNOSIS — R413 Other amnesia: Secondary | ICD-10-CM | POA: Diagnosis not present

## 2019-04-14 DIAGNOSIS — E119 Type 2 diabetes mellitus without complications: Secondary | ICD-10-CM | POA: Diagnosis not present

## 2019-04-14 DIAGNOSIS — J449 Chronic obstructive pulmonary disease, unspecified: Secondary | ICD-10-CM | POA: Diagnosis not present

## 2019-04-14 DIAGNOSIS — I4891 Unspecified atrial fibrillation: Secondary | ICD-10-CM | POA: Diagnosis not present

## 2019-04-14 DIAGNOSIS — I2510 Atherosclerotic heart disease of native coronary artery without angina pectoris: Secondary | ICD-10-CM | POA: Diagnosis not present

## 2019-04-14 DIAGNOSIS — I10 Essential (primary) hypertension: Secondary | ICD-10-CM | POA: Diagnosis not present

## 2019-04-14 DIAGNOSIS — Z48815 Encounter for surgical aftercare following surgery on the digestive system: Secondary | ICD-10-CM | POA: Diagnosis not present

## 2019-04-16 ENCOUNTER — Telehealth (HOSPITAL_COMMUNITY): Payer: Self-pay | Admitting: *Deleted

## 2019-04-16 DIAGNOSIS — M329 Systemic lupus erythematosus, unspecified: Secondary | ICD-10-CM | POA: Diagnosis not present

## 2019-04-16 DIAGNOSIS — J449 Chronic obstructive pulmonary disease, unspecified: Secondary | ICD-10-CM | POA: Diagnosis not present

## 2019-04-16 DIAGNOSIS — E1165 Type 2 diabetes mellitus with hyperglycemia: Secondary | ICD-10-CM | POA: Diagnosis not present

## 2019-04-16 DIAGNOSIS — E119 Type 2 diabetes mellitus without complications: Secondary | ICD-10-CM | POA: Diagnosis not present

## 2019-04-16 DIAGNOSIS — I10 Essential (primary) hypertension: Secondary | ICD-10-CM | POA: Diagnosis not present

## 2019-04-16 DIAGNOSIS — I2699 Other pulmonary embolism without acute cor pulmonale: Secondary | ICD-10-CM | POA: Diagnosis not present

## 2019-04-16 DIAGNOSIS — I4891 Unspecified atrial fibrillation: Secondary | ICD-10-CM | POA: Diagnosis not present

## 2019-04-16 DIAGNOSIS — Z48815 Encounter for surgical aftercare following surgery on the digestive system: Secondary | ICD-10-CM | POA: Diagnosis not present

## 2019-04-16 DIAGNOSIS — Z6824 Body mass index (BMI) 24.0-24.9, adult: Secondary | ICD-10-CM | POA: Diagnosis not present

## 2019-04-16 DIAGNOSIS — Z299 Encounter for prophylactic measures, unspecified: Secondary | ICD-10-CM | POA: Diagnosis not present

## 2019-04-16 DIAGNOSIS — I2510 Atherosclerotic heart disease of native coronary artery without angina pectoris: Secondary | ICD-10-CM | POA: Diagnosis not present

## 2019-04-16 DIAGNOSIS — M545 Low back pain: Secondary | ICD-10-CM | POA: Diagnosis not present

## 2019-04-16 NOTE — Telephone Encounter (Signed)
[employer] APPROVED COVERAGE OR PAYMENT FOR THE PRESCRIPTION DRUG REQUESTED UNDER MEDICARE PRESCRIPTION DRUG PLAN  REQUEST HAS BEEN APPROVED HYDROXYZINE HCL 25 MG  EFFECTIVE: 09/06/2018 THRU 04/15/2020

## 2019-04-18 DIAGNOSIS — Z48815 Encounter for surgical aftercare following surgery on the digestive system: Secondary | ICD-10-CM | POA: Diagnosis not present

## 2019-04-18 DIAGNOSIS — E119 Type 2 diabetes mellitus without complications: Secondary | ICD-10-CM | POA: Diagnosis not present

## 2019-04-18 DIAGNOSIS — J449 Chronic obstructive pulmonary disease, unspecified: Secondary | ICD-10-CM | POA: Diagnosis not present

## 2019-04-18 DIAGNOSIS — I10 Essential (primary) hypertension: Secondary | ICD-10-CM | POA: Diagnosis not present

## 2019-04-18 DIAGNOSIS — I2510 Atherosclerotic heart disease of native coronary artery without angina pectoris: Secondary | ICD-10-CM | POA: Diagnosis not present

## 2019-04-18 DIAGNOSIS — I4891 Unspecified atrial fibrillation: Secondary | ICD-10-CM | POA: Diagnosis not present

## 2019-04-23 DIAGNOSIS — I4891 Unspecified atrial fibrillation: Secondary | ICD-10-CM | POA: Diagnosis not present

## 2019-04-23 DIAGNOSIS — I2510 Atherosclerotic heart disease of native coronary artery without angina pectoris: Secondary | ICD-10-CM | POA: Diagnosis not present

## 2019-04-23 DIAGNOSIS — M159 Polyosteoarthritis, unspecified: Secondary | ICD-10-CM | POA: Diagnosis not present

## 2019-04-23 DIAGNOSIS — I10 Essential (primary) hypertension: Secondary | ICD-10-CM | POA: Diagnosis not present

## 2019-04-24 DIAGNOSIS — I2510 Atherosclerotic heart disease of native coronary artery without angina pectoris: Secondary | ICD-10-CM | POA: Diagnosis not present

## 2019-04-24 DIAGNOSIS — I4891 Unspecified atrial fibrillation: Secondary | ICD-10-CM | POA: Diagnosis not present

## 2019-04-24 DIAGNOSIS — Z48815 Encounter for surgical aftercare following surgery on the digestive system: Secondary | ICD-10-CM | POA: Diagnosis not present

## 2019-04-24 DIAGNOSIS — J449 Chronic obstructive pulmonary disease, unspecified: Secondary | ICD-10-CM | POA: Diagnosis not present

## 2019-04-24 DIAGNOSIS — I10 Essential (primary) hypertension: Secondary | ICD-10-CM | POA: Diagnosis not present

## 2019-04-24 DIAGNOSIS — E119 Type 2 diabetes mellitus without complications: Secondary | ICD-10-CM | POA: Diagnosis not present

## 2019-04-27 ENCOUNTER — Ambulatory Visit (INDEPENDENT_AMBULATORY_CARE_PROVIDER_SITE_OTHER): Payer: Medicare Other | Admitting: Psychiatry

## 2019-04-27 ENCOUNTER — Encounter (HOSPITAL_COMMUNITY): Payer: Self-pay | Admitting: Psychiatry

## 2019-04-27 ENCOUNTER — Other Ambulatory Visit: Payer: Self-pay

## 2019-04-27 DIAGNOSIS — Z87891 Personal history of nicotine dependence: Secondary | ICD-10-CM

## 2019-04-27 DIAGNOSIS — F331 Major depressive disorder, recurrent, moderate: Secondary | ICD-10-CM

## 2019-04-27 DIAGNOSIS — G4700 Insomnia, unspecified: Secondary | ICD-10-CM | POA: Diagnosis not present

## 2019-04-27 DIAGNOSIS — M549 Dorsalgia, unspecified: Secondary | ICD-10-CM

## 2019-04-27 DIAGNOSIS — F419 Anxiety disorder, unspecified: Secondary | ICD-10-CM | POA: Diagnosis not present

## 2019-04-27 MED ORDER — HYDROXYZINE HCL 50 MG PO TABS: ORAL_TABLET | ORAL | 2 refills | Status: DC

## 2019-04-27 MED ORDER — ARIPIPRAZOLE 10 MG PO TABS: 15.0000 mg | ORAL_TABLET | Freq: Every day | ORAL | 3 refills | Status: DC

## 2019-04-27 MED ORDER — LORAZEPAM 0.5 MG PO TABS: 0.5000 mg | ORAL_TABLET | Freq: Three times a day (TID) | ORAL | 2 refills | Status: DC | PRN

## 2019-04-27 MED ORDER — BUPROPION HCL ER (XL) 150 MG PO TB24: 150.0000 mg | ORAL_TABLET | ORAL | 2 refills | Status: DC

## 2019-04-27 MED ORDER — DULOXETINE HCL 60 MG PO CPEP: 60.0000 mg | ORAL_CAPSULE | Freq: Two times a day (BID) | ORAL | 3 refills | Status: DC

## 2019-04-27 NOTE — Progress Notes (Signed)
Virtual Visit via Telephone Note  I connected with Grace Howard on 04/27/19 at 11:20 AM EDT by telephone and verified that I am speaking with the correct person using two identifiers.   I discussed the limitations, risks, security and privacy concerns of performing an evaluation and management service by telephone and the availability of in person appointments. I also discussed with the patient that there may be a patient responsible charge related to this service. The patient expressed understanding and agreed to proceed.      I discussed the assessment and treatment plan with the patient. The patient was provided an opportunity to ask questions and all were answered. The patient agreed with the plan and demonstrated an understanding of the instructions.   The patient was advised to call back or seek an in-person evaluation if the symptoms worsen or if the condition fails to improve as anticipated.  I provided 15 minutes of non-face-to-face time during this encounter.   Diannia Ruder, MD  [facility] MD/PA/NP OP Progress Note  04/27/2019 11:29 AM Grace Howard  MRN:  [id number]  Chief Complaint:  Chief Complaint    Depression; Anxiety; Follow-up     HPI: This patient is an 83 year oldfemale who livesalonein [place]. She has a sister in [place] and 2 sons who live in [place]. 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  The patient states that she's had depression for many years. She was in a bad marriage in the 56s and was hospitalized at [facility]. She's been seeing psychiatrist treatment ever since then in the most part she's been fairly stable recently. However it she's worried about several health issues. She had a breast cyst which turned out to be benign. She has thyroid nodules but no change in her thyroid hormone. She also has skin cancer in the thickening of her uterine lining. All these things have been getting her worried. She is only  on 2 mg of Valium per day and would like an increase because she is very anxious. Her mood is fairly stable but she tends to stay to herself a lot. She denies crying spells panic attacks suicidal ideation her appetite change  The patient returns for follow-up after 3 weeks.  She was having a great deal of difficulty sleeping and I added hydroxyzine and she is taking 100 mg at bedtime.  She states that she is only sleeping up to 5 hours at night and sometimes only 3 or 4 hours.  However her caregiver [name] tells me that the patient sits in a chair or lays in bed all day and continually naps.  I explained to [facility] and the patient that people cannot sleep at night if they sleep all day.  The patient seems to have given up and feels humiliated that she has to have a caregiver.  She seems more depressed although she denies being suicidal.  I suggested that we add Wellbutrin to her regimen and she agrees.  Ativan is helping her anxiety but it wears off at midday so we will add a midday dose.  She is not oversedated from it or zoned out.  I told her that she must try harder to stay active during the day and not give into the impulse to nap throughout the day and she agrees. Visit Diagnosis:    ICD-10-CM   1. Major depressive disorder, recurrent episode, moderate (HCC)  F33.1     Past Psychiatric History: 2 psychiatric admissions in the  39s for depression  Past Medical History:  Past Medical History:  Diagnosis Date  . Anxiety   . Depression   . GERD (gastroesophageal reflux disease)   . Headache(784.0)   . HTN (hypertension)   . Hyperlipidemia   . Insomnia   . Lupus ([place])   . Macular degeneration June 2013  . Osteoarthritis June 2013  . Osteoporosis   . PE (pulmonary embolism) 2008   not sure why  . Seizures ([place])   . Wears glasses     Past Surgical History:  Procedure Laterality Date  . abdomnial surgery    . ANKLE FRACTURE SURGERY  2000   right  . APPENDECTOMY    . CARDIAC  CATHETERIZATION  2008   PE-filter in  . COLONOSCOPY    . DILITATION & CURRETTAGE/HYSTROSCOPY WITH ESSURE    . EYE SURGERY     both cataracts  . HARDWARE REMOVAL Left 02/26/2014   Procedure: REMOVAL HARDWARE ;  Surgeon: Tennis Must, MD;  Location: [place];  Service: Orthopedics;  Laterality: Left;  . TONSILLECTOMY    . WRIST SURGERY  2011   may-lt    Family Psychiatric History: See below  Family History:  Family History  Problem Relation Age of Onset  . Dementia Father   . ADD / ADHD Other   . ADD / ADHD Other   . Healthy Son   . Healthy Son   . Depression Sister   . Anxiety disorder Sister   . Sexual abuse Sister   . Alcohol abuse Paternal Aunt   . Drug abuse Paternal Uncle   . Bipolar disorder Neg Hx   . OCD Neg Hx   . Paranoid behavior Neg Hx   . Schizophrenia Neg Hx   . Seizures Neg Hx   . Physical abuse Neg Hx     Social History:  Social History   Socioeconomic History  . Marital status: Widowed    Spouse name: Not on file  . Number of children: Not on file  . Years of education: Not on file  . Highest education level: Not on file  Occupational History  . Occupation: retired Psychologist, prison and probation services   Social Needs  . Financial resource strain: Not hard at all  . Food insecurity    Worry: Never true    Inability: Never true  . Transportation needs    Medical: No    Non-medical: No  Tobacco Use  . Smoking status: Former Smoker    Quit date: 02/19/1978    Years since quitting: 41.2  . Smokeless tobacco: Never Used  Substance and Sexual Activity  . Alcohol use: No  . Drug use: No  . Sexual activity: Never  Lifestyle  . Physical activity    Days per week: Not on file    Minutes per session: Not on file  . Stress: Not on file  Relationships  . Social Herbalist on phone: Not on file    Gets together: Not on file    Attends religious service: Not on file    Active member of club or organization: Not on file    Attends meetings  of clubs or organizations: Not on file    Relationship status: Not on file  Other Topics Concern  . Not on file  Social History Narrative    Mrs Blauvelt lives alone and is widowed. Mrs Broberg was an Psychologist, prison and probation services. She has the support of her sister and friends. Angela Nevin is  her caregiver  Albin FellingCarla has been assisting [name] for 3 years     Allergies:  Allergies  Allergen Reactions  . Tegretol [Carbamazepine] Other (See Comments)    Thought to have caused her Lupus    Metabolic Disorder Labs: Lab Results  Component Value Date   HGBA1C 5.7 (H) 02/15/2012   MPG 117 (H) 02/15/2012   No results found for: PROLACTIN No results found for: CHOL, TRIG, HDL, CHOLHDL, VLDL, LDLCALC No results found for: TSH  Therapeutic Level Labs: No results found for: LITHIUM No results found for: VALPROATE No components found for:  CBMZ  Current Medications: Current Outpatient Medications  Medication Sig Dispense Refill  . amLODipine (NORVASC) 5 MG tablet Take 5 mg by mouth daily.    . ARIPiprazole (ABILIFY) 10 MG tablet Take 1.5 tablets (15 mg total) by mouth daily. 45 tablet 3  . benazepril (LOTENSIN) 20 MG tablet     . Calcium Carbonate-Vitamin D (CALCIUM + D PO) Take by mouth 2 (two) times daily.    . celecoxib (CELEBREX) 100 MG capsule Take 1 capsule by mouth twice daily 60 capsule 0  . diclofenac sodium (VOLTAREN) 1 % GEL APPLY 3GM TWICE DAILY  2  . DULoxetine (CYMBALTA) 60 MG capsule Take 1 capsule (60 mg total) by mouth 2 (two) times daily. 60 capsule 3  . hydroxychloroquine (PLAQUENIL) 200 MG tablet Take 300 mg by mouth daily.     . hydrOXYzine (ATARAX/VISTARIL) 50 MG tablet Take one or two at bedtime for sleep 60 tablet 2  . levETIRAcetam (KEPPRA) 500 MG tablet Take 500 mg by mouth daily.     Marland Kitchen. LORazepam (ATIVAN) 0.5 MG tablet Take 1 tablet (0.5 mg total) by mouth 2 (two) times daily as needed for anxiety. 60 tablet 2  . metoprolol tartrate (LOPRESSOR) 25 MG tablet Take 25 mg by mouth 2  (two) times daily.    . Multiple Vitamin (MULTIVITAMIN) capsule Take 1 capsule by mouth daily.    . Multiple Vitamins-Minerals (HM COMPLETE 50+ MENS ULTIMATE PO) TAKE ONE TABLET BY MOUTH DAILY  12  . Multiple Vitamins-Minerals (PRESERVISION AREDS) TABS Take 1 tablet by mouth daily.  12  . omega-3 acid ethyl esters (LOVAZA) 1 G capsule Take 2 g by mouth daily.    . pantoprazole (PROTONIX) 40 MG tablet Take 40 mg by mouth daily.    . pravastatin (PRAVACHOL) 80 MG tablet Take 80 mg by mouth daily.    Marland Kitchen. tiZANidine (ZANAFLEX) 2 MG tablet Take 2 mg by mouth 2 (two) times daily as needed for muscle spasms.    . traMADol (ULTRAM) 50 MG tablet     . warfarin (COUMADIN) 2 MG tablet Take by mouth as directed.      No current facility-administered medications for this visit.      Musculoskeletal: Strength & Muscle Tone: decreased Gait & Station: unsteady Patient leans: N/A  Psychiatric Specialty Exam: Review of Systems  Constitutional: Positive for malaise/fatigue.  Musculoskeletal: Positive for back pain.  Psychiatric/Behavioral: Positive for depression. The patient is nervous/anxious and has insomnia.   All other systems reviewed and are negative.   There were no vitals taken for this visit.There is no height or weight on file to calculate BMI.  General Appearance: NA  Eye Contact:  NA  Speech:  Clear and Coherent  Volume:  Normal  Mood:  Depressed  Affect:  NA  Thought Process:  Goal Directed  Orientation:  Full (Time, Place, and Person)  Thought Content: Rumination  Suicidal Thoughts:  No  Homicidal Thoughts:  No  Memory:  Immediate;   Good Recent;   Fair Remote;   Fair  Judgement:  Fair  Insight:  Fair  Psychomotor Activity:  Decreased  Concentration:  Concentration: Fair and Attention Span: Fair  Recall:  [employer] of Knowledge: Good  Language: Good  Akathisia:  No  Handed:  Right  AIMS (if indicated): not done  Assets:  Communication Skills Desire for  Improvement Resilience Social Support Talents/Skills  ADL's:  Intact  Cognition: Impaired,  Mild  Sleep:  Poor   Screenings: PHQ2-9     Patient Outreach Telephone from 03/12/2019 in [facility]  PHQ-2 Total Score  0       Assessment and Plan: This patient is an 83 year old female with a long history of depression and anxiety.  She has had a physical and mental decline recently and this is been difficult for her to accept.  She will continue Ativan 0.5 mg but increase the dosage to 3 times daily for anxiety.  For now she will continue hydroxyzine 100 mg at bedtime for sleep.  She will continue Cymbalta 60 mg twice daily for depression and we will add Wellbutrin XL 150 mg also for depression and continue Abilify 15 mg daily for augmentation.  I have encouraged her at length did not sit around all day.  She will return to see me in 4 weeks   Diannia Ruder, MD 04/27/2019, 11:29 AM

## 2019-05-01 DIAGNOSIS — I4891 Unspecified atrial fibrillation: Secondary | ICD-10-CM | POA: Diagnosis not present

## 2019-05-01 DIAGNOSIS — I10 Essential (primary) hypertension: Secondary | ICD-10-CM | POA: Diagnosis not present

## 2019-05-01 DIAGNOSIS — I2510 Atherosclerotic heart disease of native coronary artery without angina pectoris: Secondary | ICD-10-CM | POA: Diagnosis not present

## 2019-05-01 DIAGNOSIS — Z48815 Encounter for surgical aftercare following surgery on the digestive system: Secondary | ICD-10-CM | POA: Diagnosis not present

## 2019-05-01 DIAGNOSIS — E119 Type 2 diabetes mellitus without complications: Secondary | ICD-10-CM | POA: Diagnosis not present

## 2019-05-01 DIAGNOSIS — J449 Chronic obstructive pulmonary disease, unspecified: Secondary | ICD-10-CM | POA: Diagnosis not present

## 2019-05-04 DIAGNOSIS — E119 Type 2 diabetes mellitus without complications: Secondary | ICD-10-CM | POA: Diagnosis not present

## 2019-05-04 DIAGNOSIS — Z48815 Encounter for surgical aftercare following surgery on the digestive system: Secondary | ICD-10-CM | POA: Diagnosis not present

## 2019-05-04 DIAGNOSIS — I2510 Atherosclerotic heart disease of native coronary artery without angina pectoris: Secondary | ICD-10-CM | POA: Diagnosis not present

## 2019-05-04 DIAGNOSIS — I10 Essential (primary) hypertension: Secondary | ICD-10-CM | POA: Diagnosis not present

## 2019-05-04 DIAGNOSIS — J449 Chronic obstructive pulmonary disease, unspecified: Secondary | ICD-10-CM | POA: Diagnosis not present

## 2019-05-04 DIAGNOSIS — I4891 Unspecified atrial fibrillation: Secondary | ICD-10-CM | POA: Diagnosis not present

## 2019-05-07 DIAGNOSIS — I4891 Unspecified atrial fibrillation: Secondary | ICD-10-CM | POA: Diagnosis not present

## 2019-05-07 DIAGNOSIS — I2510 Atherosclerotic heart disease of native coronary artery without angina pectoris: Secondary | ICD-10-CM | POA: Diagnosis not present

## 2019-05-07 DIAGNOSIS — I10 Essential (primary) hypertension: Secondary | ICD-10-CM | POA: Diagnosis not present

## 2019-05-07 DIAGNOSIS — Z48815 Encounter for surgical aftercare following surgery on the digestive system: Secondary | ICD-10-CM | POA: Diagnosis not present

## 2019-05-07 DIAGNOSIS — E119 Type 2 diabetes mellitus without complications: Secondary | ICD-10-CM | POA: Diagnosis not present

## 2019-05-07 DIAGNOSIS — J449 Chronic obstructive pulmonary disease, unspecified: Secondary | ICD-10-CM | POA: Diagnosis not present

## 2019-05-23 ENCOUNTER — Ambulatory Visit (INDEPENDENT_AMBULATORY_CARE_PROVIDER_SITE_OTHER): Payer: Medicare Other | Admitting: Psychiatry

## 2019-05-23 ENCOUNTER — Other Ambulatory Visit: Payer: Self-pay

## 2019-05-23 ENCOUNTER — Encounter (HOSPITAL_COMMUNITY): Payer: Self-pay | Admitting: Psychiatry

## 2019-05-23 DIAGNOSIS — I2510 Atherosclerotic heart disease of native coronary artery without angina pectoris: Secondary | ICD-10-CM | POA: Diagnosis not present

## 2019-05-23 DIAGNOSIS — F331 Major depressive disorder, recurrent, moderate: Secondary | ICD-10-CM

## 2019-05-23 DIAGNOSIS — M159 Polyosteoarthritis, unspecified: Secondary | ICD-10-CM | POA: Diagnosis not present

## 2019-05-23 DIAGNOSIS — I4891 Unspecified atrial fibrillation: Secondary | ICD-10-CM | POA: Diagnosis not present

## 2019-05-23 DIAGNOSIS — I10 Essential (primary) hypertension: Secondary | ICD-10-CM | POA: Diagnosis not present

## 2019-05-23 MED ORDER — ARIPIPRAZOLE 10 MG PO TABS: 15.0000 mg | ORAL_TABLET | Freq: Every day | ORAL | 3 refills | Status: DC

## 2019-05-23 MED ORDER — LORAZEPAM 0.5 MG PO TABS: 0.5000 mg | ORAL_TABLET | Freq: Four times a day (QID) | ORAL | 2 refills | Status: DC

## 2019-05-23 MED ORDER — HYDROXYZINE HCL 50 MG PO TABS: ORAL_TABLET | ORAL | 2 refills | Status: DC

## 2019-05-23 MED ORDER — BUPROPION HCL ER (XL) 150 MG PO TB24: 150.0000 mg | ORAL_TABLET | ORAL | 2 refills | Status: DC

## 2019-05-23 MED ORDER — DULOXETINE HCL 60 MG PO CPEP: 60.0000 mg | ORAL_CAPSULE | Freq: Two times a day (BID) | ORAL | 3 refills | Status: DC

## 2019-05-23 NOTE — Progress Notes (Signed)
Virtual Visit via Telephone Note  I connected with Grace Howard on 05/23/19 at  1:00 PM EDT by telephone and verified that I am speaking with the correct person using two identifiers.   I discussed the limitations, risks, security and privacy concerns of performing an evaluation and management service by telephone and the availability of in person appointments. I also discussed with the patient that there may be a patient responsible charge related to this service. The patient expressed understanding and agreed to proceed.      I discussed the assessment and treatment plan with the patient. The patient was provided an opportunity to ask questions and all were answered. The patient agreed with the plan and demonstrated an understanding of the instructions.   The patient was advised to call back or seek an in-person evaluation if the symptoms worsen or if the condition fails to improve as anticipated.  I provided 15 minutes of non-face-to-face time during this encounter.   Grace Howard , MD  [facility] MD/PA/NP OP Progress Note  05/23/2019 1:30 PM Grace JordanLucy R Howard  MRN:  [id number]  Chief Complaint:  Chief Complaint    Depression; Anxiety; Follow-up     HPI: This patient is a83 year oldfemale who livesalonein [place]. She has a sister in [place] and 2 sons who live in [place]. 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  The patient states that she's had depression for many years. She was in a bad marriage in the 4770s and was [facility]. She's been seeing psychiatrist treatment ever since then in the most part she's been fairly stable recently. However it she's worried about several health issues. She had a breast cyst which turned out to be benign. She has thyroid nodules but no change in her thyroid hormone. She also has skin cancer in the thickening of her uterine lining. All these things have been getting her worried. She is only  on 2 mg of Valium per day and would like an increase because she is very anxious. Her mood is fairly stable but she tends to stay to herself a lot. She denies crying spells panic attacks suicidal ideation her appetite change  The patient returns for follow-up after 4 weeks.  The patient claims that she and her sister are in the battle of wills over money.  She states that her sister now is power of attorney and is trying to control her finances.  The patient admits that she has been well off all her life and is not used to living on a budget.  She likes to buy a lot of gifts for family members for Christmas.  Her caregiver states that she gets out of control with spending and buys things at people do not want her need.  Ultimately her sister may have to come in to control the credit cards.  In the meantime this whole conflict is made the patient very anxious and depressed.  She is not sleeping all that well.  Her caregiver thinks a little bit more Ativan would help so we will increase it to 0.5 mg 4 times a day.  This is not the best situation it would be better if the 2 sisters could come to some sort of compromise on the spending. Visit Diagnosis:    ICD-10-CM   1. Major depressive disorder, recurrent episode, moderate (HCC)  F33.1     Past Psychiatric History: 2 psychiatric admissions in the 80s for depression  Past  Medical History:  Past Medical History:  Diagnosis Date  . Anxiety   . Depression   . GERD (gastroesophageal reflux disease)   . Headache(784.0)   . HTN (hypertension)   . Hyperlipidemia   . Insomnia   . Lupus (HCC)   . Macular degeneration June 2013  . Osteoarthritis June 2013  . Osteoporosis   . PE (pulmonary embolism) 2008   not sure why  . Seizures (HCC)   . Wears glasses     Past Surgical History:  Procedure Laterality Date  . abdomnial surgery    . ANKLE FRACTURE SURGERY  2000   right  . APPENDECTOMY    . CARDIAC CATHETERIZATION  2008   PE-filter in  .  COLONOSCOPY    . DILITATION & CURRETTAGE/HYSTROSCOPY WITH ESSURE    . EYE SURGERY     both cataracts  . HARDWARE REMOVAL Left 02/26/2014   Procedure: REMOVAL HARDWARE ;  Surgeon: Tami Ribas, MD;  Location: [facility];  Service: Orthopedics;  Laterality: Left;  . TONSILLECTOMY    . WRIST SURGERY  2011   may-lt    Family Psychiatric History: See below  Family History:  Family History  Problem Relation Age of Onset  . Dementia Father   . ADD / ADHD Other   . ADD / ADHD Other   . Healthy Son   . Healthy Son   . Depression Sister   . Anxiety disorder Sister   . Sexual abuse Sister   . Alcohol abuse Paternal Aunt   . Drug abuse Paternal Uncle   . Bipolar disorder Neg Hx   . OCD Neg Hx   . Paranoid behavior Neg Hx   . Schizophrenia Neg Hx   . Seizures Neg Hx   . Physical abuse Neg Hx     Social History:  Social History   Socioeconomic History  . Marital status: Widowed    Spouse name: Not on file  . Number of children: Not on file  . Years of education: Not on file  . Highest education level: Not on file  Occupational History  . Occupation: retired Retail buyer   Social Needs  . Financial resource strain: Not hard at all  . Food insecurity    Worry: Never true    Inability: Never true  . Transportation needs    Medical: No    Non-medical: No  Tobacco Use  . Smoking status: Former Smoker    Quit date: 02/19/1978    Years since quitting: 41.2  . Smokeless tobacco: Never Used  Substance and Sexual Activity  . Alcohol use: No  . Drug use: No  . Sexual activity: Never  Lifestyle  . Physical activity    Days per week: Not on file    Minutes per session: Not on file  . Stress: Not on file  Relationships  . Social Musician on phone: Not on file    Gets together: Not on file    Attends religious service: Not on file    Active member of club or organization: Not on file    Attends meetings of clubs or organizations: Not on file     Relationship status: Not on file  Other Topics Concern  . Not on file  Social History Narrative    [name] lives alone and is widowed. Mrs Galati was an Retail buyer. She has the support of her sister and friends. [name] is her caregiver  [name] has  been assisting [name] for 3 years     Allergies:  Allergies  Allergen Reactions  . Tegretol [Carbamazepine] Other (See Comments)    Thought to have caused her Lupus    Metabolic Disorder Labs: Lab Results  Component Value Date   HGBA1C 5.7 (H) 02/15/2012   MPG 117 (H) 02/15/2012   No results found for: PROLACTIN No results found for: CHOL, TRIG, HDL, CHOLHDL, VLDL, LDLCALC No results found for: TSH  Therapeutic Level Labs: No results found for: LITHIUM No results found for: VALPROATE No components found for:  CBMZ  Current Medications: Current Outpatient Medications  Medication Sig Dispense Refill  . amLODipine (NORVASC) 5 MG tablet Take 5 mg by mouth daily.    . ARIPiprazole (ABILIFY) 10 MG tablet Take 1.5 tablets (15 mg total) by mouth daily. 45 tablet 3  . benazepril (LOTENSIN) 20 MG tablet     . buPROPion (WELLBUTRIN XL) 150 MG 24 hr tablet Take 1 tablet (150 mg total) by mouth every morning. 90 tablet 2  . Calcium Carbonate-Vitamin D (CALCIUM + D PO) Take by mouth 2 (two) times daily.    . celecoxib (CELEBREX) 100 MG capsule Take 1 capsule by mouth twice daily 60 capsule 0  . diclofenac sodium (VOLTAREN) 1 % GEL APPLY 3GM TWICE DAILY  2  . DULoxetine (CYMBALTA) 60 MG capsule Take 1 capsule (60 mg total) by mouth 2 (two) times daily. 60 capsule 3  . hydroxychloroquine (PLAQUENIL) 200 MG tablet Take 300 mg by mouth daily.     . hydrOXYzine (ATARAX/VISTARIL) 50 MG tablet Take one or two at bedtime for sleep 60 tablet 2  . levETIRAcetam (KEPPRA) 500 MG tablet Take 500 mg by mouth daily.     Marland Kitchen. LORazepam (ATIVAN) 0.5 MG tablet Take 1 tablet (0.5 mg total) by mouth 4 (four) times daily. 120 tablet 2  . metoprolol  tartrate (LOPRESSOR) 25 MG tablet Take 25 mg by mouth 2 (two) times daily.    . Multiple Vitamin (MULTIVITAMIN) capsule Take 1 capsule by mouth daily.    . Multiple Vitamins-Minerals (HM COMPLETE 50+ MENS ULTIMATE PO) TAKE ONE TABLET BY MOUTH DAILY  12  . Multiple Vitamins-Minerals (PRESERVISION AREDS) TABS Take 1 tablet by mouth daily.  12  . omega-3 acid ethyl esters (LOVAZA) 1 G capsule Take 2 g by mouth daily.    . pantoprazole (PROTONIX) 40 MG tablet Take 40 mg by mouth daily.    . pravastatin (PRAVACHOL) 80 MG tablet Take 80 mg by mouth daily.    Marland Kitchen. tiZANidine (ZANAFLEX) 2 MG tablet Take 2 mg by mouth 2 (two) times daily as needed for muscle spasms.    . traMADol (ULTRAM) 50 MG tablet     . warfarin (COUMADIN) 2 MG tablet Take by mouth as directed.      No current facility-administered medications for this visit.      Musculoskeletal: Strength & Muscle Tone: decreased Gait & Station: unsteady Patient leans: N/A  Psychiatric Specialty Exam: Review of Systems  Musculoskeletal: Positive for back pain and joint pain.  Psychiatric/Behavioral: Positive for depression. The patient is nervous/anxious.   All other systems reviewed and are negative.   There were no vitals taken for this visit.There is no height or weight on file to calculate BMI.  General Appearance: NA  Eye Contact:  NA  Speech:  Clear and Coherent  Volume:  Normal  Mood:  Anxious and Dysphoric  Affect:  NA  Thought Process:  Goal Directed  Orientation:  Full (Time, Place, and Person)  Thought Content: Rumination   Suicidal Thoughts:  No  Homicidal Thoughts:  No  Memory:  Immediate;   Good Recent;   Fair Remote;   Poor  Judgement:  Poor  Insight:  Lacking  Psychomotor Activity:  Decreased  Concentration:  Concentration: Fair and Attention Span: Fair  Recall:  Poor  Fund of Knowledge: Good  Language: Good  Akathisia:  No  Handed:  Right  AIMS (if indicated): not done  Assets:  Communication  Skills Desire for Improvement Resilience Social Support  ADL's:  Intact  Cognition: Impaired,  Mild  Sleep:  Fair   Screenings: PHQ2-9     Patient Outreach Telephone from 03/12/2019 in [place]  PHQ-2 Total Score  0       Assessment and Plan: This patient is an 83 year old female with a long history of depression anxiety and recent decline into mild dementia.  She is having new struggles regarding her finances with her sister and does not seem to understand that limits must be set.  She will increase the Ativan 0.5 mg to 4 times daily due to anxiety.  She will continue hydroxyzine 100 mg at bedtime for sleep, Cymbalta 60 mg twice daily for depression on Wellbutrin XL 150 mg for depression and Abilify 15 mg daily for augmentation.  She will return to see me in 6 weeks   Levonne Spiller, MD 05/23/2019, 1:30 PM

## 2019-06-07 ENCOUNTER — Telehealth (HOSPITAL_COMMUNITY): Payer: Self-pay

## 2019-06-07 DIAGNOSIS — Z299 Encounter for prophylactic measures, unspecified: Secondary | ICD-10-CM | POA: Diagnosis not present

## 2019-06-07 DIAGNOSIS — I2699 Other pulmonary embolism without acute cor pulmonale: Secondary | ICD-10-CM | POA: Diagnosis not present

## 2019-06-07 DIAGNOSIS — Z6826 Body mass index (BMI) 26.0-26.9, adult: Secondary | ICD-10-CM | POA: Diagnosis not present

## 2019-06-07 DIAGNOSIS — I10 Essential (primary) hypertension: Secondary | ICD-10-CM | POA: Diagnosis not present

## 2019-06-07 DIAGNOSIS — M329 Systemic lupus erythematosus, unspecified: Secondary | ICD-10-CM | POA: Diagnosis not present

## 2019-06-07 DIAGNOSIS — R350 Frequency of micturition: Secondary | ICD-10-CM | POA: Diagnosis not present

## 2019-06-07 DIAGNOSIS — R413 Other amnesia: Secondary | ICD-10-CM | POA: Diagnosis not present

## 2019-06-07 NOTE — Telephone Encounter (Signed)
Patient's caregiver, Grace Howard, called regarding patient. She stated that she has decreased patient's Lorazepam to 2-3 tablets/day as opposed to 4 tablets/day. She stated that since patient has started her Wellbutrin, she has a massive amount of confusion that has not cleared up. She stated patient is up all night, doesn't know where she is, and thinks she hears people in the house. Patient's caregiver would like to know if the Wellbutrin might be causing this behavior. Patient's caregiver requested a call at [phone]. Thank you.

## 2019-06-07 NOTE — Telephone Encounter (Signed)
Told her to stop wellbutrin

## 2019-06-08 DIAGNOSIS — I10 Essential (primary) hypertension: Secondary | ICD-10-CM | POA: Diagnosis not present

## 2019-06-08 DIAGNOSIS — R0902 Hypoxemia: Secondary | ICD-10-CM | POA: Diagnosis not present

## 2019-06-08 DIAGNOSIS — R079 Chest pain, unspecified: Secondary | ICD-10-CM | POA: Diagnosis not present

## 2019-06-11 ENCOUNTER — Telehealth (HOSPITAL_COMMUNITY): Payer: Self-pay | Admitting: *Deleted

## 2019-06-11 NOTE — Telephone Encounter (Signed)
Called to f/u that message was received to stop taking Wellbutrin? Message received & patient is still having massive confusion. Caregiver called PCP to inform that  Patient was looking for her son: Grace Howard & that she is married to Grace Howard. PCP sent referral to [facility] For testing Dementia &  Alzheimer's.

## 2019-06-12 DIAGNOSIS — Z6825 Body mass index (BMI) 25.0-25.9, adult: Secondary | ICD-10-CM | POA: Diagnosis not present

## 2019-06-12 DIAGNOSIS — I2699 Other pulmonary embolism without acute cor pulmonale: Secondary | ICD-10-CM | POA: Diagnosis not present

## 2019-06-12 DIAGNOSIS — I10 Essential (primary) hypertension: Secondary | ICD-10-CM | POA: Diagnosis not present

## 2019-06-12 DIAGNOSIS — M5134 Other intervertebral disc degeneration, thoracic region: Secondary | ICD-10-CM | POA: Diagnosis not present

## 2019-06-12 DIAGNOSIS — M329 Systemic lupus erythematosus, unspecified: Secondary | ICD-10-CM | POA: Diagnosis not present

## 2019-06-12 DIAGNOSIS — M545 Low back pain: Secondary | ICD-10-CM | POA: Diagnosis not present

## 2019-06-12 DIAGNOSIS — M549 Dorsalgia, unspecified: Secondary | ICD-10-CM | POA: Diagnosis not present

## 2019-06-12 DIAGNOSIS — M5136 Other intervertebral disc degeneration, lumbar region: Secondary | ICD-10-CM | POA: Diagnosis not present

## 2019-06-12 DIAGNOSIS — M546 Pain in thoracic spine: Secondary | ICD-10-CM | POA: Diagnosis not present

## 2019-06-12 DIAGNOSIS — Z299 Encounter for prophylactic measures, unspecified: Secondary | ICD-10-CM | POA: Diagnosis not present

## 2019-06-13 ENCOUNTER — Ambulatory Visit: Payer: Medicare Other | Admitting: Neurology

## 2019-06-13 ENCOUNTER — Telehealth: Payer: Self-pay

## 2019-06-13 ENCOUNTER — Encounter: Payer: Self-pay | Admitting: Neurology

## 2019-06-13 DIAGNOSIS — R52 Pain, unspecified: Secondary | ICD-10-CM | POA: Diagnosis not present

## 2019-06-13 DIAGNOSIS — Z79899 Other long term (current) drug therapy: Secondary | ICD-10-CM | POA: Diagnosis not present

## 2019-06-13 DIAGNOSIS — Z86718 Personal history of other venous thrombosis and embolism: Secondary | ICD-10-CM | POA: Diagnosis not present

## 2019-06-13 DIAGNOSIS — E7800 Pure hypercholesterolemia, unspecified: Secondary | ICD-10-CM | POA: Diagnosis not present

## 2019-06-13 DIAGNOSIS — R0689 Other abnormalities of breathing: Secondary | ICD-10-CM | POA: Diagnosis not present

## 2019-06-13 DIAGNOSIS — M5489 Other dorsalgia: Secondary | ICD-10-CM | POA: Diagnosis not present

## 2019-06-13 DIAGNOSIS — J984 Other disorders of lung: Secondary | ICD-10-CM | POA: Diagnosis not present

## 2019-06-13 DIAGNOSIS — Z87891 Personal history of nicotine dependence: Secondary | ICD-10-CM | POA: Diagnosis not present

## 2019-06-13 DIAGNOSIS — F0390 Unspecified dementia without behavioral disturbance: Secondary | ICD-10-CM | POA: Diagnosis not present

## 2019-06-13 DIAGNOSIS — J449 Chronic obstructive pulmonary disease, unspecified: Secondary | ICD-10-CM | POA: Diagnosis not present

## 2019-06-13 DIAGNOSIS — I10 Essential (primary) hypertension: Secondary | ICD-10-CM | POA: Diagnosis not present

## 2019-06-13 DIAGNOSIS — F329 Major depressive disorder, single episode, unspecified: Secondary | ICD-10-CM | POA: Diagnosis not present

## 2019-06-13 DIAGNOSIS — M549 Dorsalgia, unspecified: Secondary | ICD-10-CM | POA: Diagnosis not present

## 2019-06-13 DIAGNOSIS — R0902 Hypoxemia: Secondary | ICD-10-CM | POA: Diagnosis not present

## 2019-06-13 DIAGNOSIS — N3000 Acute cystitis without hematuria: Secondary | ICD-10-CM | POA: Diagnosis not present

## 2019-06-13 DIAGNOSIS — R5383 Other fatigue: Secondary | ICD-10-CM | POA: Diagnosis not present

## 2019-06-13 DIAGNOSIS — E876 Hypokalemia: Secondary | ICD-10-CM | POA: Diagnosis not present

## 2019-06-13 DIAGNOSIS — R079 Chest pain, unspecified: Secondary | ICD-10-CM | POA: Diagnosis not present

## 2019-06-13 DIAGNOSIS — M545 Low back pain: Secondary | ICD-10-CM | POA: Diagnosis not present

## 2019-06-13 NOTE — Telephone Encounter (Signed)
Pt cancel appt today could not make it today. Pt no show r/s for 06/18/2019.

## 2019-06-15 DIAGNOSIS — M329 Systemic lupus erythematosus, unspecified: Secondary | ICD-10-CM | POA: Diagnosis not present

## 2019-06-15 DIAGNOSIS — I2699 Other pulmonary embolism without acute cor pulmonale: Secondary | ICD-10-CM | POA: Diagnosis not present

## 2019-06-15 DIAGNOSIS — Z6825 Body mass index (BMI) 25.0-25.9, adult: Secondary | ICD-10-CM | POA: Diagnosis not present

## 2019-06-15 DIAGNOSIS — I10 Essential (primary) hypertension: Secondary | ICD-10-CM | POA: Diagnosis not present

## 2019-06-15 DIAGNOSIS — Z87891 Personal history of nicotine dependence: Secondary | ICD-10-CM | POA: Diagnosis not present

## 2019-06-15 DIAGNOSIS — M549 Dorsalgia, unspecified: Secondary | ICD-10-CM | POA: Diagnosis not present

## 2019-06-15 DIAGNOSIS — J449 Chronic obstructive pulmonary disease, unspecified: Secondary | ICD-10-CM | POA: Diagnosis not present

## 2019-06-15 DIAGNOSIS — Z299 Encounter for prophylactic measures, unspecified: Secondary | ICD-10-CM | POA: Diagnosis not present

## 2019-06-18 ENCOUNTER — Encounter: Payer: Self-pay | Admitting: Neurology

## 2019-06-18 ENCOUNTER — Other Ambulatory Visit: Payer: Self-pay

## 2019-06-18 ENCOUNTER — Ambulatory Visit (INDEPENDENT_AMBULATORY_CARE_PROVIDER_SITE_OTHER): Payer: Medicare Other | Admitting: Neurology

## 2019-06-18 VITALS — BP 179/91 | HR 88 | Ht 66.0 in | Wt 166.0 lb

## 2019-06-18 DIAGNOSIS — F39 Unspecified mood [affective] disorder: Secondary | ICD-10-CM

## 2019-06-18 DIAGNOSIS — Z87898 Personal history of other specified conditions: Secondary | ICD-10-CM | POA: Diagnosis not present

## 2019-06-18 DIAGNOSIS — R410 Disorientation, unspecified: Secondary | ICD-10-CM | POA: Diagnosis not present

## 2019-06-18 DIAGNOSIS — R799 Abnormal finding of blood chemistry, unspecified: Secondary | ICD-10-CM | POA: Diagnosis not present

## 2019-06-18 DIAGNOSIS — R413 Other amnesia: Secondary | ICD-10-CM

## 2019-06-18 NOTE — Progress Notes (Signed)
Subjective:    Patient ID: Grace Howard is a 83 y.o. female.  HPI     [name], MD, PhD [facility] [address], [address] [address] [place], [place] [address]  Dear Herminio Commons,   I saw your patient, Grace Howard, upon your kind request in my neurologic clinic today for initial consultation of her memory loss.  The patient is accompanied by [name] her daytime caretaker today.  As you know, [name] is a 83 year old right-handed woman with an underlying complex medical history of hypertension, hyperlipidemia, osteoporosis, history of pulmonary embolism, lupus, depression, history of seizures, reflux disease, anxiety, osteoarthritis, macular degeneration and mildly overweight state, who reports episodes of confusion and disorientation since June of this year.  She had gallbladder surgery.  Her caretaker believes that she had mild memory loss before and some rare or intermittent episodes of confusion but this certainly became much worse after her gallbladder surgery.  She was in rehab until 2023/03/30.  She is followed by a psychiatrist for her anxiety and depression.  She has had worsening anxiety and depression and sleep disturbance.  She takes Atarax at night for sleep.  She was taken off of Valium because of the confusion but started on lorazepam 0.5 mg 3 times daily.  Of note, she is on multiple potentially sedating medications including lorazepam, Cymbalta, Keppra, Abilify, hydroxyzine.  She does not sleep very well.  She has in-home caretakers, [name] has known her for years, she usually stays from 9 AM to 6 PM, they have also hired a second caretaker for overnight stay, Marchelle Folks, who stays from 10 PM to 6 AM, this started recently.   I reviewed your office note from 06/07/2019.  She had some cognitive testing in your office and results were fairly good per caretaker.  She has not had a brain scan in years.  She may have had an MRI when she was first diagnosed with seizures over 20  years ago.  She has been on Keppra for years, this was started when she was still living in [place].  She is widowed, husband passed away in 03/30/23 of last year.  She has 2 grown sons.  She is a retired Ecologist.  She does not have a family history of dementia, mom lived to be 70 years old and had breast cancer, father lived to be in his 30s and had some confusion at the end, he had pancreatic cancer.  She has a younger sister who is younger by 10 years, no memory issues.  She had a hospitalization in June for toxic encephalopathy.  I could not pull up any records from that hospitalization.   Her Past Medical History Is Significant For: Past Medical History:  Diagnosis Date  . Anxiety   . Depression   . GERD (gastroesophageal reflux disease)   . Headache(784.0)   . HTN (hypertension)   . Hyperlipidemia   . Insomnia   . Lupus (HCC)   . Macular degeneration June 2013  . Osteoarthritis June 2013  . Osteoporosis   . PE (pulmonary embolism) 2008   not sure why  . Seizures (HCC)   . Wears glasses     Her Past Surgical History Is Significant For: Past Surgical History:  Procedure Laterality Date  . abdomnial surgery    . ANKLE FRACTURE SURGERY  2000   right  . APPENDECTOMY    . CARDIAC CATHETERIZATION  2008   PE-filter in  . COLONOSCOPY    .  DILITATION & CURRETTAGE/HYSTROSCOPY WITH ESSURE    . EYE SURGERY     both cataracts  . HARDWARE REMOVAL Left 02/26/2014   Procedure: REMOVAL HARDWARE ;  Surgeon: Tami RibasKevin R Kuzma, MD;  Location: [facility];  Service: Orthopedics;  Laterality: Left;  . TONSILLECTOMY    . WRIST SURGERY  2011   may-lt    Her Family History Is Significant For: Family History  Problem Relation Age of Onset  . Dementia Father   . ADD / ADHD Other   . ADD / ADHD Other   . Healthy Son   . Healthy Son   . Depression Sister   . Anxiety disorder Sister   . Sexual abuse Sister   . Alcohol abuse Paternal Aunt   . Drug abuse  Paternal Uncle   . Bipolar disorder Neg Hx   . OCD Neg Hx   . Paranoid behavior Neg Hx   . Schizophrenia Neg Hx   . Seizures Neg Hx   . Physical abuse Neg Hx     Her Social History Is Significant For: Social History   Socioeconomic History  . Marital status: Widowed    Spouse name: Not on file  . Number of children: Not on file  . Years of education: Not on file  . Highest education level: Not on file  Occupational History  . 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 Financial resource strain: Not hard at all  . Food insecurity    Worry: Never true    Inability: Never true  . Transportation needs    Medical: No    Non-medical: No  Tobacco Use  . Smoking status: Former Smoker    Quit date: 02/19/1978    Years since quitting: 41.3  . Smokeless tobacco: Never Used  Substance and Sexual Activity  . Alcohol use: No  . Drug use: No  . Sexual activity: Never  Lifestyle  . Physical activity    Days per week: Not on file    Minutes per session: Not on file  . Stress: Not on file  Relationships  . Social Musicianconnections    Talks on phone: Not on file    Gets together: Not on file    Attends religious service: Not on file    Active member of club or organization: Not on file    Attends meetings of clubs or organizations: Not on file    Relationship status: Not on file  Other Topics Concern  . Not on file  Social History Narrative    Grace Howard lives alone and is widowed. 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 She has the support of her sister and friends. Albin FellingCarla is her caregiver  Albin FellingCarla has been assisting Grace Howard for 3 years     Her Allergies Are:  Allergies  Allergen Reactions  . Tegretol [Carbamazepine] Other (See Comments)    Thought to have caused her Lupus  :   Her Current Medications Are:  Outpatient Encounter Medications as of 06/18/2019  Medication Sig  . amLODipine (NORVASC) 5 MG tablet Take 5 mg by mouth daily.  . ARIPiprazole (ABILIFY) 10 MG  tablet Take 1.5 tablets (15 mg total) by mouth daily.  . benazepril (LOTENSIN) 20 MG tablet   . Calcium Carbonate-Vitamin D (CALCIUM + D PO) Take by mouth 2 (two) times daily.  . diclofenac sodium (VOLTAREN) 1 % GEL APPLY 3GM TWICE DAILY  . DULoxetine (CYMBALTA) 60 MG capsule Take 1 capsule (60 mg  total) by mouth 2 (two) times daily.  . hydroxychloroquine (PLAQUENIL) 200 MG tablet Take 300 mg by mouth daily.   . hydrOXYzine (ATARAX/VISTARIL) 50 MG tablet Take one or two at bedtime for sleep  . levETIRAcetam (KEPPRA) 500 MG tablet Take 500 mg by mouth daily.   Marland Kitchen LORazepam (ATIVAN) 0.5 MG tablet Take 1 tablet (0.5 mg total) by mouth 4 (four) times daily. (Patient taking differently: Take 0.5 mg by mouth 3 (three) times daily. )  . metoprolol tartrate (LOPRESSOR) 25 MG tablet Take 25 mg by mouth 2 (two) times daily.  . Multiple Vitamins-Minerals (HM COMPLETE 50+ MENS ULTIMATE PO) TAKE ONE TABLET BY MOUTH DAILY  . Multiple Vitamins-Minerals (MULTIVITAMIN WITH MINERALS) tablet Take by mouth.  . pantoprazole (PROTONIX) 40 MG tablet Take 40 mg by mouth daily.  . pravastatin (PRAVACHOL) 80 MG tablet Take 80 mg by mouth daily.  . traMADol (ULTRAM) 50 MG tablet   . [DISCONTINUED] buPROPion (WELLBUTRIN XL) 150 MG 24 hr tablet Take 1 tablet (150 mg total) by mouth every morning.  . [DISCONTINUED] Calcium Carb-Cholecalciferol 600-800 MG-UNIT TABS Take by mouth.  . [DISCONTINUED] celecoxib (CELEBREX) 100 MG capsule Take 1 capsule by mouth twice daily  . [DISCONTINUED] diazepam (VALIUM) 2 MG tablet TAKE 1 TABLET BY MOUTH THREE TIMES DAILY AS NEEDED FOR ANXIETY  . [DISCONTINUED] furosemide (LASIX) 20 MG tablet Take by mouth.  . [DISCONTINUED] Multiple Vitamins-Minerals (PRESERVISION AREDS) TABS Take 1 tablet by mouth daily.  . [DISCONTINUED] omega-3 acid ethyl esters (LOVAZA) 1 G capsule Take 2 g by mouth daily.  . [DISCONTINUED] tiZANidine (ZANAFLEX) 2 MG tablet Take 2 mg by mouth 2 (two) times daily as  needed for muscle spasms.  . [DISCONTINUED] traZODone (DESYREL) 150 MG tablet Take by mouth.  . [DISCONTINUED] warfarin (COUMADIN) 2 MG tablet Take by mouth as directed.    No facility-administered encounter medications on file as of 06/18/2019.   :  Review of Systems:  Out of a complete 14 point review of systems, all are reviewed and negative with the exception of these symptoms as listed below: Review of Systems  Neurological:       Pt presents today to discuss her memory. Pt has noticed since her cholecystectomy that she is more forgetful.    Objective:  Neurological Exam  Physical Exam Physical Examination:   Vitals:   06/18/19 1529  BP: (!) 179/91  Pulse: 88    General Examination: The patient is a very pleasant 84 y.o. female in no acute distress. She appears well-developed and well-nourished and well groomed.   HEENT: Normocephalic, atraumatic, pupils are equal, round and reactive to light and accommodation. Extraocular tracking is well preserved, face is symmetric with normal facial animation, hearing is grossly intact.  Airway examination reveals moderate mouth dryness, otherwise benign findings, tongue protrudes centrally in palate elevates symmetrically.  No carotid bruits.   Chest: Clear to auscultation without wheezing, rhonchi or crackles noted.  Heart: S1+S2+0, regular and normal without murmurs, rubs or gallops noted.   Abdomen: Soft, non-tender and non-distended with normal bowel sounds appreciated on auscultation.  Extremities: There is no pitting edema in the distal lower extremities bilaterally.   Skin: Warm and dry without trophic changes noted.  Musculoskeletal: exam reveals no obvious joint deformities, tenderness or joint swelling or erythema.   Neurologically:  Mental status: The patient is awake, alert and oriented in all 4 sphere. She has one-point loss and orientation for day of the week. There is no evidence of  aphasia, agnosia, apraxia or  anomia. Speech is clear with normal prosody and enunciation. Thought process is linear. Mood is normal and affect is normal.   On 06/18/2019: MMSE: 26/30, CDT: 4/4, AFT: 14/min.   Cranial nerves II - XII are as described above under HEENT exam. In addition: shoulder shrug is normal with equal shoulder height noted. Motor exam: Normal bulk, strength and tone is noted. There is no drift, tremor or rebound. Romberg is negative. Reflexes are 2+ throughout. Babinski: Toes are flexor bilaterally. Fine motor skills and coordination: intact with normal finger taps, normal hand movements, normal rapid alternating patting, normal foot taps and normal foot agility.  Cerebellar testing: No dysmetria or intention tremor on finger to nose testing. Heel to shin is unremarkable bilaterally. There is no truncal or gait ataxia.  Sensory exam: intact to light touch in the upper and lower extremities.  Gait, station and balance: She stands With mild difficulty and reports lower back discomfort.  She walks with a rolling walker with seat, she maneuvers her walker well.  Balance is mildly impaired.  She is slightly insecure with turns.    Assessment and Plan:   In summary, KELLSIE GRINDLE is a very pleasant 83 y.o.-year old female with an underlying complex medical history of hypertension, hyperlipidemia, osteoporosis, history of pulmonary embolism, lupus, depression, history of seizures, reflux disease, anxiety, osteoarthritis, macular degeneration and mildly overweight state, who Presents for evaluation of her memory loss and confusion which started in June 2020.  She was hospitalized for a fairly extended period of time for encephalopathy and was in rehab.  She has no telltale family history of Alzheimer's disease and her history is not very suggestive of Alzheimer's dementia.  She Scores fairly well on the MMSE today.  I would like to proceed with additional testing to rule out any other treatable cause or organic cause of  her memory issue and confusion.  She has a longer standing history of seizures, has not had any seizures in years.  She has mood disorder and is on quite a few psychotropic medications which could be contributing to her memory issues unfortunately. I would like to proceed with blood work today, and EEG will be ordered as well.  We will proceed with a brain MRI without contrast and I would like to get a formal neuropsychology consultation done with memory evaluation with a licensed neuropsychologist. We talked about the importance of keeping a healthy lifestyle, fall prevention, good support system. I did not suggest any new medications from my end of things today.  We will keep her posted as to her test results and follow-up after testing in a  Couple of months.  She is advised to use her walker at all times and stay well-hydrated but not over hydrate.  She may drink up to 5 16 ounce bottles of water per day.  She does like to drink tea and soda, typically no coffee.  I answered all their questions today and the patient and Angela Nevin were in agreement. Thank you very much for allowing me to participate in the care of this nice patient. If I can be of any further assistance to you please do not hesitate to call me at [phone].  Sincerely,   Star Age, MD, PhD

## 2019-06-18 NOTE — Patient Instructions (Signed)
You have complaints of memory loss: memory loss or changes in cognitive function can have many reasons and does not always mean you have dementia. Conditions that can contribute to subjective or objective memory loss include: depression, stress, poor sleep from insomnia or sleep apnea, dehydration, fluctuation in blood sugar values, thyroid or electrolyte dysfunction and certain vitamin deficiencies. Dementia can be caused by stroke, brain atherosclerosis or brain vascular disease due to vascular risk factors (smoking, high blood pressure, high cholesterol, obesity and uncontrolled diabetes), certain degenerative brain disorders (including Parkinson's disease and Multiple sclerosis) and by Alzheimer's disease or other, more rare and sometimes hereditary causes. We will do some additional testing: blood work, and we will do a brain scan. We will not start medication as yet. We will also request a formal cognitive test called neuropsychological evaluation which is done by a licensed neuropsychologist. We will make a referral in that regard. We will do an EEG (brainwave test), which we will schedule. We will call you with the results.

## 2019-06-19 ENCOUNTER — Telehealth: Payer: Self-pay

## 2019-06-19 LAB — COMPREHENSIVE METABOLIC PANEL
ALT: 21 IU/L (ref 0–32)
AST: 27 IU/L (ref 0–40)
Albumin/Globulin Ratio: 1.4 (ref 1.2–2.2)
Albumin: 4.3 g/dL (ref 3.6–4.6)
Alkaline Phosphatase: 114 IU/L (ref 39–117)
BUN/Creatinine Ratio: 17 (ref 12–28)
BUN: 14 mg/dL (ref 8–27)
Bilirubin Total: 0.3 mg/dL (ref 0.0–1.2)
CO2: 20 mmol/L (ref 20–29)
Calcium: 9.7 mg/dL (ref 8.7–10.3)
Chloride: 96 mmol/L (ref 96–106)
Creatinine, Ser: 0.84 mg/dL (ref 0.57–1.00)
GFR calc Af Amer: 75 mL/min/{1.73_m2} (ref >59)
GFR calc non Af Amer: 65 mL/min/{1.73_m2} (ref >59)
Globulin, Total: 3 g/dL (ref 1.5–4.5)
Glucose: 130 mg/dL — ABNORMAL HIGH (ref 65–99)
Potassium: 5.1 mmol/L (ref 3.5–5.2)
Sodium: 132 mmol/L — ABNORMAL LOW (ref 134–144)
Total Protein: 7.3 g/dL (ref 6.0–8.5)

## 2019-06-19 LAB — ENA+DNA/DS+SJORGEN'S
ENA RNP Ab: >8 AI — ABNORMAL HIGH (ref 0.0–0.9)
ENA SM Ab Ser-aCnc: <0.2 AI (ref 0.0–0.9)
ENA SSA (RO) Ab: >8 AI — ABNORMAL HIGH (ref 0.0–0.9)
ENA SSB (LA) Ab: <0.2 AI (ref 0.0–0.9)
dsDNA Ab: <1 IU/mL (ref 0–9)

## 2019-06-19 LAB — AMMONIA: Ammonia: 24 ug/dL — ABNORMAL LOW (ref 28–135)

## 2019-06-19 LAB — B12 AND FOLATE PANEL
Folate: 18.8 ng/mL (ref >3.0)
Vitamin B-12: 609 pg/mL (ref 232–1245)

## 2019-06-19 LAB — ANA W/REFLEX: Anti Nuclear Antibody (ANA): POSITIVE — AB

## 2019-06-19 LAB — TSH: TSH: 0.462 u[IU]/mL (ref 0.450–4.500)

## 2019-06-19 LAB — HGB A1C W/O EAG: Hgb A1c MFr Bld: 5.7 % — ABNORMAL HIGH (ref 4.8–5.6)

## 2019-06-19 NOTE — Telephone Encounter (Signed)
-----   Message from Star Age, MD sent at 06/19/2019  5:05 PM EDT ----- Labs show no obvious cause of dementia, diabetes marker called A1c was prediabetes range.  Her autoimmune marker called ANA was positive, I read in her medical history that she had a diagnosis of lupus.  Please advise patient that she should be in follow-up with her rheumatologist.  If she does not have a rheumatologist she should call her primary care physician regarding rheumatology referral. Her sodium level was slightly below normal, I recall that her caretaker mentioned that she had some low electrolytes before.  If she drinks too much water she may have low sodium from too much water intake. 6-8 cups are generally recommended for daily water intake.

## 2019-06-19 NOTE — Telephone Encounter (Signed)
I called pt and advised her of these results and recommendations. Pt has a follow up with Dr. Woody Seller at the end of the week and will make him aware. She asked that I send a copy of these results to Dr. Woody Seller. Pt verbalized understanding of results. Pt had no questions at this time but was encouraged to call back if questions arise.

## 2019-06-19 NOTE — Progress Notes (Signed)
Labs show no obvious cause of dementia, diabetes marker called A1c was prediabetes range.  Her autoimmune marker called ANA was positive, I read in her medical history that she had a diagnosis of lupus.  Please advise patient that she should be in follow-up with her rheumatologist.  If she does not have a rheumatologist she should call her primary care physician regarding rheumatology referral. Her sodium level was slightly below normal, I recall that her caretaker mentioned that she had some low electrolytes before.  If she drinks too much water she may have low sodium from too much water intake. 6-8 cups are generally recommended for daily water intake.

## 2019-06-20 ENCOUNTER — Telehealth: Payer: Self-pay | Admitting: Neurology

## 2019-06-20 ENCOUNTER — Encounter: Payer: Self-pay | Admitting: Psychology

## 2019-06-20 NOTE — Telephone Encounter (Signed)
Medicare/BCBS supp no auth patient is scheduled at [facility] for 06/26/19 arrival time is 12:30 pm. I spoke to the care giver and she stated she would have to call them to r/s because they will not be able to do that day. I gave their number of [phone] to r/s.

## 2019-06-22 DIAGNOSIS — Z299 Encounter for prophylactic measures, unspecified: Secondary | ICD-10-CM | POA: Diagnosis not present

## 2019-06-22 DIAGNOSIS — Z87891 Personal history of nicotine dependence: Secondary | ICD-10-CM | POA: Diagnosis not present

## 2019-06-22 DIAGNOSIS — M329 Systemic lupus erythematosus, unspecified: Secondary | ICD-10-CM | POA: Diagnosis not present

## 2019-06-22 DIAGNOSIS — M549 Dorsalgia, unspecified: Secondary | ICD-10-CM | POA: Diagnosis not present

## 2019-06-22 DIAGNOSIS — E785 Hyperlipidemia, unspecified: Secondary | ICD-10-CM | POA: Diagnosis not present

## 2019-06-22 DIAGNOSIS — I10 Essential (primary) hypertension: Secondary | ICD-10-CM | POA: Diagnosis not present

## 2019-06-22 DIAGNOSIS — Z6824 Body mass index (BMI) 24.0-24.9, adult: Secondary | ICD-10-CM | POA: Diagnosis not present

## 2019-06-25 ENCOUNTER — Ambulatory Visit: Payer: Medicare Other | Admitting: Neurology

## 2019-06-26 ENCOUNTER — Ambulatory Visit (HOSPITAL_COMMUNITY): Payer: Medicare Other

## 2019-06-27 ENCOUNTER — Ambulatory Visit (HOSPITAL_COMMUNITY): Payer: Medicare Other

## 2019-07-02 DIAGNOSIS — I2699 Other pulmonary embolism without acute cor pulmonale: Secondary | ICD-10-CM | POA: Diagnosis not present

## 2019-07-02 DIAGNOSIS — Z23 Encounter for immunization: Secondary | ICD-10-CM | POA: Diagnosis not present

## 2019-07-02 DIAGNOSIS — J449 Chronic obstructive pulmonary disease, unspecified: Secondary | ICD-10-CM | POA: Diagnosis not present

## 2019-07-02 DIAGNOSIS — Z299 Encounter for prophylactic measures, unspecified: Secondary | ICD-10-CM | POA: Diagnosis not present

## 2019-07-02 DIAGNOSIS — M545 Low back pain: Secondary | ICD-10-CM | POA: Diagnosis not present

## 2019-07-02 DIAGNOSIS — M329 Systemic lupus erythematosus, unspecified: Secondary | ICD-10-CM | POA: Diagnosis not present

## 2019-07-04 ENCOUNTER — Ambulatory Visit (INDEPENDENT_AMBULATORY_CARE_PROVIDER_SITE_OTHER): Payer: Medicare Other | Admitting: Psychiatry

## 2019-07-04 ENCOUNTER — Other Ambulatory Visit: Payer: Self-pay

## 2019-07-04 ENCOUNTER — Encounter (HOSPITAL_COMMUNITY): Payer: Self-pay | Admitting: Psychiatry

## 2019-07-04 ENCOUNTER — Ambulatory Visit (HOSPITAL_COMMUNITY)
Admission: RE | Admit: 2019-07-04 | Discharge: 2019-07-04 | Disposition: A | Discharge status: Home / Self Care | Payer: Medicare Other | Source: Ambulatory Visit | Attending: Neurology | Admitting: Neurology

## 2019-07-04 DIAGNOSIS — R410 Disorientation, unspecified: Secondary | ICD-10-CM | POA: Insufficient documentation

## 2019-07-04 DIAGNOSIS — Z87898 Personal history of other specified conditions: Secondary | ICD-10-CM | POA: Diagnosis not present

## 2019-07-04 DIAGNOSIS — F39 Unspecified mood [affective] disorder: Secondary | ICD-10-CM

## 2019-07-04 DIAGNOSIS — F331 Major depressive disorder, recurrent, moderate: Secondary | ICD-10-CM | POA: Diagnosis not present

## 2019-07-04 DIAGNOSIS — R413 Other amnesia: Secondary | ICD-10-CM | POA: Insufficient documentation

## 2019-07-04 MED ORDER — HYDROXYZINE HCL 50 MG PO TABS: ORAL_TABLET | ORAL | 2 refills | Status: DC

## 2019-07-04 MED ORDER — DULOXETINE HCL 60 MG PO CPEP: 60.0000 mg | ORAL_CAPSULE | Freq: Two times a day (BID) | ORAL | 3 refills | Status: DC

## 2019-07-04 MED ORDER — ARIPIPRAZOLE 10 MG PO TABS: 15.0000 mg | ORAL_TABLET | Freq: Every day | ORAL | 3 refills | Status: DC

## 2019-07-04 MED ORDER — LORAZEPAM 0.5 MG PO TABS: 0.5000 mg | ORAL_TABLET | Freq: Three times a day (TID) | ORAL | 3 refills | Status: DC
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
## 2019-07-04 NOTE — Progress Notes (Signed)
Please call patient and advise her that her brain MRI without contrast did not show any acute findings.  There are chronic findings in keeping with volume loss which will call atrophy.  This is generalized and symmetrical. Some degree of atrophy can be seen in patients with longstanding history of epilepsy. As discussed, we will proceed with the EEG, brainwave test.

## 2019-07-05 ENCOUNTER — Telehealth: Payer: Self-pay

## 2019-07-05 NOTE — Telephone Encounter (Signed)
I called pt and discussed her MRI results and recommendations. Pt will proceed with the EEG; she reports that she has had seizures in the past. Pt verbalized understanding of results. Pt had no questions at this time but was encouraged to call back if questions arise.

## 2019-07-05 NOTE — Telephone Encounter (Signed)
-----   Message from Star Age, MD sent at 07/04/2019  6:15 PM EDT ----- Please call patient and advise her that her brain MRI without contrast did not show any acute findings.  There are chronic findings in keeping with volume loss which will call atrophy.  This is generalized and symmetrical. Some degree of atrophy can be seen in patients with longstanding history of epilepsy. As discussed, we will proceed with the EEG, brainwave test.

## 2019-07-11 ENCOUNTER — Other Ambulatory Visit: Payer: Medicare Other

## 2019-07-16 DIAGNOSIS — I2699 Other pulmonary embolism without acute cor pulmonale: Secondary | ICD-10-CM | POA: Diagnosis not present

## 2019-07-16 DIAGNOSIS — M810 Age-related osteoporosis without current pathological fracture: Secondary | ICD-10-CM | POA: Diagnosis not present

## 2019-07-16 DIAGNOSIS — Z299 Encounter for prophylactic measures, unspecified: Secondary | ICD-10-CM | POA: Diagnosis not present

## 2019-07-16 DIAGNOSIS — E1165 Type 2 diabetes mellitus with hyperglycemia: Secondary | ICD-10-CM | POA: Diagnosis not present

## 2019-07-16 DIAGNOSIS — D692 Other nonthrombocytopenic purpura: Secondary | ICD-10-CM | POA: Diagnosis not present

## 2019-07-16 DIAGNOSIS — Z6824 Body mass index (BMI) 24.0-24.9, adult: Secondary | ICD-10-CM | POA: Diagnosis not present

## 2019-07-16 NOTE — Progress Notes (Deleted)
Office Visit Note  Patient: Grace Howard             Date of Birth: 1936-07-10           MRN: [id number]             PCP: Ignatius Specking, MD Referring: Ignatius Specking, MD Visit Date: 07/30/2019 Occupation: @GUAROCC @  Subjective:  No chief complaint on file.   History of Present Illness: Grace Howard is a 83 y.o. female ***   Activities of Daily Living:  Patient reports morning stiffness for *** {minute/hour:19697}.   Patient {ACTIONS;DENIES/REPORTS:21021675::"Denies"} nocturnal pain.  Difficulty dressing/grooming: {ACTIONS;DENIES/REPORTS:21021675::"Denies"} Difficulty climbing stairs: {ACTIONS;DENIES/REPORTS:21021675::"Denies"} Difficulty getting out of chair: {ACTIONS;DENIES/REPORTS:21021675::"Denies"} Difficulty using hands for taps, buttons, cutlery, and/or writing: {ACTIONS;DENIES/REPORTS:21021675::"Denies"}  No Rheumatology ROS completed.   PMFS History:  Patient Active Problem List   Diagnosis Date Noted  . Calculus of gallbladder with acute and chronic cholecystitis without obstruction 02/28/2019  . Unilateral primary osteoarthritis, left knee 02/22/2018  . Insomnia secondary to depression with anxiety 12/06/2012  . Chronic pain syndrome 10/16/2012  . Unspecified vitamin D deficiency 10/16/2012  . MDD (major depressive disorder) 02/15/2012    Past Medical History:  Diagnosis Date  . Anxiety   . Depression   . GERD (gastroesophageal reflux disease)   . Headache(784.0)   . HTN (hypertension)   . Hyperlipidemia   . Insomnia   . Lupus (HCC)   . Macular degeneration June 2013  . Osteoarthritis June 2013  . Osteoporosis   . PE (pulmonary embolism) 2008   not sure why  . Seizures (HCC)   . Wears glasses     Family History  Problem Relation Age of Onset  . Dementia Father   . ADD / ADHD Other   . ADD / ADHD Other   . Healthy Son   . Healthy Son   . Depression Sister   . Anxiety disorder Sister   . Sexual abuse Sister   . Alcohol abuse Paternal Aunt    . Drug abuse Paternal Uncle   . Bipolar disorder Neg Hx   . OCD Neg Hx   . Paranoid behavior Neg Hx   . Schizophrenia Neg Hx   . Seizures Neg Hx   . Physical abuse Neg Hx    Past Surgical History:  Procedure Laterality Date  . abdomnial surgery    . ANKLE FRACTURE SURGERY  2000   right  . APPENDECTOMY    . CARDIAC CATHETERIZATION  2008   PE-filter in  . COLONOSCOPY    . DILITATION & CURRETTAGE/HYSTROSCOPY WITH ESSURE    . EYE SURGERY     both cataracts  . HARDWARE REMOVAL Left 02/26/2014   Procedure: REMOVAL HARDWARE ;  Surgeon: 02/28/2014, MD;  Location: [facility];  Service: Orthopedics;  Laterality: Left;  . TONSILLECTOMY    . WRIST SURGERY  2011   may-lt   Social History   Social History Narrative    Mrs Mealor lives alone and is widowed. Mrs Fojtik was an [employer]. She has the support of her sister and friends. Retail buyer is her caregiver  [name] has been assisting Mrs Wynes for 3 years    Immunization History  Administered Date(s) Administered  . Influenza-Unspecified 06/02/2018     Objective: Vital Signs: There were no vitals taken for this visit.   Physical Exam   Musculoskeletal Exam: ***  CDAI Exam: CDAI Score: - Patient Global: -; Provider Global: - Swollen: -;  Tender: - Joint Exam   No joint exam has been documented for this visit   There is currently no information documented on the homunculus. Go to the Rheumatology activity and complete the homunculus joint exam.  Investigation: Findings:  06/18/19: ANA+, dsDNA<1, RNP>8, Ro>8, La-, smith-, TSH 0.462, ammonia 24, Vitamin B12 609, folate 18.8   Imaging: Mr Brain Wo Contrast  Result Date: 07/04/2019 CLINICAL DATA:  Memory loss.  History seizure. EXAM: MRI HEAD WITHOUT CONTRAST TECHNIQUE: Multiplanar, multiecho pulse sequences of the brain and surrounding structures were obtained without intravenous contrast. COMPARISON:  None. FINDINGS: Brain: Generalized atrophy without  hydrocephalus. Hippocampal atrophy is noted bilaterally on coronal images. Negative for acute infarct. Mild chronic white matter changes. Negative for hemorrhage or mass. No midline shift. Vascular: Normal arterial flow voids Skull and upper cervical spine: Negative Sinuses/Orbits: Paranasal sinuses clear. Right mastoid effusion. Bilateral cataract surgery Other: None IMPRESSION: Cerebral atrophy.  Hippocampal atrophy No acute abnormality.  Minimal chronic white matter changes. Electronically Signed   By: Franchot Gallo M.D.   On: 07/04/2019 11:41    Recent Labs: Lab Results  Component Value Date   WBC 7.8 02/15/2012   HGB 13.3 02/15/2012   PLT 220 02/15/2012   NA 132 (L) 06/18/2019   K 5.1 06/18/2019   CL 96 06/18/2019   CO2 20 06/18/2019   GLUCOSE 130 (H) 06/18/2019   BUN 14 06/18/2019   CREATININE 0.84 06/18/2019   BILITOT 0.3 06/18/2019   ALKPHOS 114 06/18/2019   AST 27 06/18/2019   ALT 21 06/18/2019   PROT 7.3 06/18/2019   ALBUMIN 4.3 06/18/2019   CALCIUM 9.7 06/18/2019   GFRAA 75 06/18/2019    Speciality Comments: No specialty comments available.  Procedures:  No procedures performed Allergies: Tegretol [carbamazepine]   Assessment / Plan:     Visit Diagnoses: Other organ or system involvement in systemic lupus erythematosus (HCC)  High risk medication use - PLQ 200 mg BID  Chronic pain syndrome - tramadol, gabapentin  Hx of fracture of tibia  History of pulmonary embolism  Age-related osteoporosis without current pathological fracture  Insomnia secondary to depression with anxiety  Unilateral primary osteoarthritis, left knee  Calculus of gallbladder with acute and chronic cholecystitis without obstruction  History of gastroesophageal reflux (GERD)  Essential hypertension  History of hyperlipidemia  Orders: No orders of the defined types were placed in this encounter.  No orders of the defined types were placed in this encounter.   Face-to-face  time spent with patient was *** minutes. Greater than 50% of time was spent in counseling and coordination of care.  Follow-Up Instructions: No follow-ups on file.   Ofilia Neas, PA-C  Note - This record has been created using Dragon software.  Chart creation errors have been sought, but may not always  have been located. Such creation errors do not reflect on  the standard of medical care.

## 2019-07-17 ENCOUNTER — Ambulatory Visit: Payer: Medicare Other | Admitting: Rheumatology

## 2019-07-19 DIAGNOSIS — M546 Pain in thoracic spine: Secondary | ICD-10-CM | POA: Diagnosis not present

## 2019-07-19 DIAGNOSIS — M4856XA Collapsed vertebra, not elsewhere classified, lumbar region, initial encounter for fracture: Secondary | ICD-10-CM | POA: Diagnosis not present

## 2019-07-19 DIAGNOSIS — M4807 Spinal stenosis, lumbosacral region: Secondary | ICD-10-CM | POA: Diagnosis not present

## 2019-07-19 DIAGNOSIS — M545 Low back pain: Secondary | ICD-10-CM | POA: Diagnosis not present

## 2019-07-19 DIAGNOSIS — S32040A Wedge compression fracture of fourth lumbar vertebra, initial encounter for closed fracture: Secondary | ICD-10-CM | POA: Diagnosis not present

## 2019-07-30 ENCOUNTER — Emergency Department (HOSPITAL_COMMUNITY): Payer: Medicare Other

## 2019-07-30 ENCOUNTER — Emergency Department (HOSPITAL_COMMUNITY)
Admission: EM | Admit: 2019-07-30 | Discharge: 2019-07-30 | Disposition: A | Discharge status: Home / Self Care | Payer: Medicare Other | Attending: Emergency Medicine | Admitting: Emergency Medicine

## 2019-07-30 ENCOUNTER — Encounter (HOSPITAL_COMMUNITY): Payer: Self-pay | Admitting: Emergency Medicine

## 2019-07-30 ENCOUNTER — Ambulatory Visit: Payer: Medicare Other | Admitting: Rheumatology

## 2019-07-30 ENCOUNTER — Other Ambulatory Visit: Payer: Self-pay

## 2019-07-30 DIAGNOSIS — Z79899 Other long term (current) drug therapy: Secondary | ICD-10-CM | POA: Insufficient documentation

## 2019-07-30 DIAGNOSIS — I10 Essential (primary) hypertension: Secondary | ICD-10-CM | POA: Diagnosis not present

## 2019-07-30 DIAGNOSIS — M546 Pain in thoracic spine: Secondary | ICD-10-CM | POA: Diagnosis not present

## 2019-07-30 DIAGNOSIS — G8929 Other chronic pain: Secondary | ICD-10-CM | POA: Diagnosis not present

## 2019-07-30 DIAGNOSIS — M5489 Other dorsalgia: Secondary | ICD-10-CM | POA: Diagnosis not present

## 2019-07-30 DIAGNOSIS — I959 Hypotension, unspecified: Secondary | ICD-10-CM | POA: Diagnosis not present

## 2019-07-30 DIAGNOSIS — Z7401 Bed confinement status: Secondary | ICD-10-CM | POA: Diagnosis not present

## 2019-07-30 DIAGNOSIS — Z87891 Personal history of nicotine dependence: Secondary | ICD-10-CM | POA: Diagnosis not present

## 2019-07-30 DIAGNOSIS — R079 Chest pain, unspecified: Secondary | ICD-10-CM | POA: Diagnosis not present

## 2019-07-30 DIAGNOSIS — M25519 Pain in unspecified shoulder: Secondary | ICD-10-CM | POA: Diagnosis not present

## 2019-07-30 DIAGNOSIS — R52 Pain, unspecified: Secondary | ICD-10-CM | POA: Diagnosis not present

## 2019-07-30 DIAGNOSIS — R410 Disorientation, unspecified: Secondary | ICD-10-CM | POA: Diagnosis not present

## 2019-07-30 MED ORDER — FENTANYL CITRATE (PF) 100 MCG/2ML IJ SOLN: 25.0000 ug | Freq: Once | INTRAMUSCULAR | Status: DC

## 2019-07-30 MED ORDER — METHOCARBAMOL 500 MG PO TABS
500.0000 mg | ORAL_TABLET | Freq: Once | ORAL | Status: AC
  Administered 2019-07-30: 500 mg via ORAL
  Filled 2019-07-30: qty 1

## 2019-07-30 MED ORDER — METHOCARBAMOL 500 MG PO TABS: 500.0000 mg | ORAL_TABLET | Freq: Two times a day (BID) | ORAL | 0 refills | Status: AC

## 2019-07-30 NOTE — ED Triage Notes (Signed)
Pt has been having upper back pain for 5 weeks she had a MRI done at [facility] for that.

## 2019-07-30 NOTE — ED Provider Notes (Signed)
[facility] EMERGENCY DEPARTMENT Provider Note   CSN: [id number] Arrival date & time: 07/30/19  1030     History   Chief Complaint Chief Complaint  Patient presents with   Back Pain    HPI Grace Howard is a 83 y.o. female with a past medical history of lupus, hypertension, GERD presenting to the ED with a chief complaint of left-sided upper back pain.  Symptoms have been going on for the past 2 to 3 months.  She has tried a heating pad and tramadol with only mild improvement in her symptoms.  States that the pain got worse today which prompted her visit to the ED.  Denies any injuries or falls, chest pain or shortness of breath, numbness in arms or legs, prior back surgeries, cough.     HPI  Past Medical History:  Diagnosis Date   Anxiety    Depression    GERD (gastroesophageal reflux disease)    Headache(784.0)    HTN (hypertension)    Hyperlipidemia    Insomnia    Lupus (HCC)    Macular degeneration June 2013   Osteoarthritis June 2013   Osteoporosis    PE (pulmonary embolism) 2008   not sure why   Seizures (HCC)    Wears glasses     Patient Active Problem List   Diagnosis Date Noted   Calculus of gallbladder with acute and chronic cholecystitis without obstruction 02/28/2019   Unilateral primary osteoarthritis, left knee 02/22/2018   Insomnia secondary to depression with anxiety 12/06/2012   Chronic pain syndrome 10/16/2012   Unspecified vitamin D deficiency 10/16/2012   MDD (major depressive disorder) 02/15/2012    Past Surgical History:  Procedure Laterality Date   abdomnial surgery     ANKLE FRACTURE SURGERY  2000   right   APPENDECTOMY     CARDIAC CATHETERIZATION  2008   PE-filter in   COLONOSCOPY     DILITATION & CURRETTAGE/HYSTROSCOPY WITH ESSURE     EYE SURGERY     both cataracts   HARDWARE REMOVAL Left 02/26/2014   Procedure: REMOVAL HARDWARE ;  Surgeon: Tami RibasKevin R Kuzma, MD;  Location:  SUR[facility]ERY [facility];   Service: Orthopedics;  Laterality: Left;   TONSILLECTOMY     WRIST SURGERY  2011   may-lt     OB History   No obstetric history on file.      Home Medications    Prior to Admission medications   Medication Sig Start Date End Date Taking? Authorizing Provider  amLODipine (NORVASC) 5 MG tablet Take 5 mg by mouth daily.    [provider]  ARIPiprazole (ABILIFY) 10 MG tablet Take 1.5 tablets (15 mg total) by mouth daily. 07/04/19   Myrlene Brokeross, Deborah R, MD  benazepril (LOTENSIN) 20 MG tablet  09/28/16   [provider]  Calcium Carbonate-Vitamin D (CALCIUM + D PO) Take by mouth 2 (two) times daily.    [provider]  diclofenac sodium (VOLTAREN) 1 % GEL APPLY 3GM TWICE DAILY 01/11/17   [provider]  DULoxetine (CYMBALTA) 60 MG capsule Take 1 capsule (60 mg total) by mouth 2 (two) times daily. 07/04/19 07/03/20  Myrlene Brokeross, Deborah R, MD  hydroxychloroquine (PLAQUENIL) 200 MG tablet Take 300 mg by mouth daily.     [provider]  hydrOXYzine (ATARAX/VISTARIL) 50 MG tablet Take one or two at bedtime for sleep 07/04/19   Myrlene Brokeross, Deborah R, MD  levETIRAcetam (KEPPRA) 500 MG tablet Take 500 mg by mouth daily.  [provider]  LORazepam (ATIVAN) 0.5 MG tablet Take 1 tablet (0.5 mg total) by mouth 3 (three) times daily. 07/04/19   Myrlene Broker, MD  methocarbamol (ROBAXIN) 500 MG tablet Take 1 tablet (500 mg total) by mouth 2 (two) times daily. 07/30/19   Elliona Doddridge, PA-C  metoprolol tartrate (LOPRESSOR) 25 MG tablet Take 25 mg by mouth 2 (two) times daily.    [provider]  Multiple Vitamins-Minerals (HM COMPLETE 50+ MENS ULTIMATE PO) TAKE ONE TABLET BY MOUTH DAILY 01/24/17   [provider]  Multiple Vitamins-Minerals (MULTIVITAMIN WITH MINERALS) tablet Take by mouth.    [provider]  pantoprazole (PROTONIX) 40 MG tablet Take 40 mg by mouth daily.    [provider]  pravastatin (PRAVACHOL) 80 MG  tablet Take 80 mg by mouth daily.    [provider]  traMADol Janean Sark) 50 MG tablet  11/02/16   [provider]    Family History Family History  Problem Relation Age of Onset   Dementia Father    ADD / ADHD Other    ADD / ADHD Other    Healthy Son    Healthy Son    Depression Sister    Anxiety disorder Sister    Sexual abuse Sister    Alcohol abuse Paternal Aunt    Drug abuse Paternal Uncle    Bipolar disorder Neg Hx    OCD Neg Hx    Paranoid behavior Neg Hx    Schizophrenia Neg Hx    Seizures Neg Hx    Physical abuse Neg Hx     Social History Social History   Tobacco Use   Smoking status: Former Smoker    Quit date: 02/19/1978    Years since quitting: 41.4   Smokeless tobacco: Never Used  Substance Use Topics   Alcohol use: No   Drug use: No     Allergies   Tegretol [carbamazepine]   Review of Systems Review of Systems  Constitutional: Negative for appetite change, chills and fever.  HENT: Negative for ear pain, rhinorrhea, sneezing and sore throat.   Eyes: Negative for photophobia and visual disturbance.  Respiratory: Negative for cough, chest tightness, shortness of breath and wheezing.   Cardiovascular: Negative for chest pain and palpitations.  Gastrointestinal: Negative for abdominal pain, blood in stool, constipation, diarrhea, nausea and vomiting.  Genitourinary: Negative for dysuria, hematuria and urgency.  Musculoskeletal: Positive for back pain. Negative for myalgias.  Skin: Negative for rash.  Neurological: Negative for dizziness, weakness and light-headedness.     Physical Exam Updated Vital Signs BP 130/62 (BP Location: Left Arm)    Pulse 78    Temp 98.1 F (36.7 C) (Oral)    Resp 16    SpO2 93%   Physical Exam Vitals signs and nursing note reviewed.  Constitutional:      General: She is not in acute distress.    Appearance: She is well-developed.  HENT:     Head: Normocephalic and atraumatic.      Nose: Nose normal.  Eyes:     General: No scleral icterus.       Right eye: No discharge.        Left eye: No discharge.     Conjunctiva/sclera: Conjunctivae normal.  Neck:     Musculoskeletal: Normal range of motion and neck supple.  Cardiovascular:     Rate and Rhythm: Normal rate and regular rhythm.     Heart sounds: Normal heart sounds. No murmur. No  friction rub. No gallop.   Pulmonary:     Effort: Pulmonary effort is normal. No respiratory distress.     Breath sounds: Normal breath sounds.  Abdominal:     General: Bowel sounds are normal. There is no distension.     Palpations: Abdomen is soft.     Tenderness: There is no abdominal tenderness. There is no guarding.  Musculoskeletal: Normal range of motion.        General: Tenderness present.       Back:     Comments: No midline spinal tenderness present in lumbar, thoracic or cervical spine. No step-off palpated. No visible bruising, edema or temperature change noted. No objective signs of numbness present. No saddle anesthesia. 2+ DP pulses bilaterally. Sensation intact to light touch. Strength 5/5 in bilateral lower extremities.  Skin:    General: Skin is warm and dry.     Findings: No rash.  Neurological:     Mental Status: She is alert.     Motor: No abnormal muscle tone.     Coordination: Coordination normal.      ED Treatments / Results  Labs (all labs ordered are listed, but only abnormal results are displayed) Labs Reviewed - No data to display  EKG None  Radiology Dg Chest 2 View  Result Date: 07/30/2019 CLINICAL DATA:  Upper back and chest pain for 5 weeks. EXAM: CHEST - 2 VIEW COMPARISON:  CT chest 08/25/2017. FINDINGS: Slight leftward deviation of the trachea due to right thyroid enlargement. Heart is enlarged. Lungs are clear. No pleural fluid. IMPRESSION: No acute findings. Electronically Signed   By: Leanna BattlesMelinda  Blietz M.D.   On: 07/30/2019 12:41    Procedures Procedures (including critical care  time)  Medications Ordered in ED Medications  methocarbamol (ROBAXIN) tablet 500 mg (500 mg Oral Given 07/30/19 1239)     Initial Impression / Assessment and Plan / ED Course  I have reviewed the triage vital signs and the nursing notes.  Pertinent labs & imaging results that were available during my care of the patient were reviewed by me and considered in my medical decision making (see chart for details).        83yo F with a past medical history of lupus, hypertension, GERD presenting to the ED with a chief complaint of left-sided back pain.  Symptoms have been going on for the past 2 to 3 months.  Some improvement noted with heating pad and tramadol.  On exam there is tenderness palpation of the left paraspinal musculature around the thoracic spine without midline tenderness.  She denies any chest pain, shortness of breath or leg swelling. Patient denies any concerning symptoms suggestive of cauda equina requiring urgent imaging at this time such as loss of sensation in the lower extremities, lower extremity weakness, loss of bowel or bladder control, saddle anesthesia, urinary retention, fever/chills, IVDU. Exam demonstrated no  weakness on exam today. No preceding injury or trauma to suggest acute fracture.  Chest x-ray is unremarkable.  Doubt pelvic or urinary pathology for patient's acute back pain, as patient denies urinary symptoms. Doubt AAA as cause of patient's back pain as patient lacks major risk factors, had no abdominal TTP, and has symmetric and intact distal pulses. Patient given strict return precautions for any symptoms indicating worsening neurologic function in the lower extremities.   Patient is hemodynamically stable, in NAD. Evaluation does not show pathology that would require ongoing emergent intervention or inpatient treatment. I explained the diagnosis to the patient. Pain  has been managed and has no complaints prior to discharge. Patient is comfortable with above  plan and is stable for discharge at this time. All questions were answered prior to disposition. Strict return precautions for returning to the ED were discussed. Encouraged follow up with PCP.   An After Visit Summary was printed and given to the patient.   Portions of this note were generated with Lobbyist. Dictation errors may occur despite best attempts at proofreading.   Final Clinical Impressions(s) / ED Diagnoses   Final diagnoses:  Chronic left-sided thoracic back pain    ED Discharge Orders         Ordered    methocarbamol (ROBAXIN) 500 MG tablet  2 times daily     07/30/19 1312           Delia Heady, PA-C 07/30/19 1314    Nat Christen, MD 07/31/19 [id number]

## 2019-07-30 NOTE — Discharge Instructions (Signed)
Continue your home medications as previously prescribed. Return to the ED if he starts to develop worsening symptoms, injuries or falls, chest pain or shortness of breath.

## 2019-07-30 NOTE — ED Notes (Signed)
Her sisters phone number [phone] cel number [phone]

## 2019-08-01 ENCOUNTER — Other Ambulatory Visit: Payer: Medicare Other

## 2019-08-06 ENCOUNTER — Telehealth (HOSPITAL_COMMUNITY): Payer: Self-pay | Admitting: *Deleted

## 2019-08-06 NOTE — Telephone Encounter (Signed)
Pt not scheduled to see me until 1/7, please call [name] to move up appt. When she took Valium 2 mg in the past she was oversedated

## 2019-08-06 NOTE — Telephone Encounter (Signed)
[name] REID (CARE GIVER) [phone] CALLED STATED 0.5 MG LORAZEPAM IS NOT WORKING. AND PATIENT'S SISTER  & CARE GIVER INFORMED PATIENT WILL BE MOVING TO [place] @ THE END OF YEAR. AND THIS HAS BEEN VERY UPSETTING TO PATIENT. AND  REQUESTING IF PATIENT COULD GO BACK ON VALIUM 2 MG FOR 30 DAYS TO GET THIS UPCOMING MOVE. REQUESTED A Chief of Staff REID (CARE GIVER) [phone]

## 2019-08-06 NOTE — Telephone Encounter (Signed)
SPOKE WITH CARE GIVER ([name])  & CHANGED APPT TO 9 AM 08/21/2019

## 2019-08-15 ENCOUNTER — Telehealth: Payer: Self-pay | Admitting: Rheumatology

## 2019-08-15 NOTE — Telephone Encounter (Signed)
FYI: Patient is moving to [place] at the end of the month to go to an assisted living facility. She will not be needing another appointment in the future.

## 2019-08-15 NOTE — Telephone Encounter (Signed)
Noted. Patient canceled new patient appointment on 07/30/2019.

## 2019-08-16 ENCOUNTER — Ambulatory Visit: Payer: Medicare Other | Admitting: Rheumatology

## 2019-08-21 ENCOUNTER — Encounter (HOSPITAL_COMMUNITY): Payer: Self-pay | Admitting: Psychiatry

## 2019-08-21 ENCOUNTER — Other Ambulatory Visit: Payer: Self-pay

## 2019-08-21 ENCOUNTER — Ambulatory Visit (INDEPENDENT_AMBULATORY_CARE_PROVIDER_SITE_OTHER): Payer: Medicare Other | Admitting: Psychiatry

## 2019-08-21 DIAGNOSIS — Z111 Encounter for screening for respiratory tuberculosis: Secondary | ICD-10-CM | POA: Diagnosis not present

## 2019-08-21 DIAGNOSIS — F331 Major depressive disorder, recurrent, moderate: Secondary | ICD-10-CM | POA: Diagnosis not present

## 2019-08-21 MED ORDER — DULOXETINE HCL 60 MG PO CPEP: 60.0000 mg | ORAL_CAPSULE | Freq: Two times a day (BID) | ORAL | 3 refills | Status: AC

## 2019-08-21 MED ORDER — ARIPIPRAZOLE 10 MG PO TABS: 15.0000 mg | ORAL_TABLET | Freq: Every day | ORAL | 3 refills | Status: AC

## 2019-08-21 MED ORDER — LORAZEPAM 0.5 MG PO TABS: 0.5000 mg | ORAL_TABLET | Freq: Three times a day (TID) | ORAL | 3 refills | Status: AC

## 2019-08-21 MED ORDER — HYDROXYZINE HCL 50 MG PO TABS: ORAL_TABLET | ORAL | 2 refills | Status: AC

## 2019-08-21 MED ORDER — DIAZEPAM 2 MG PO TABS: 2.0000 mg | ORAL_TABLET | Freq: Three times a day (TID) | ORAL | 0 refills | Status: AC | PRN
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
## 2019-08-27 ENCOUNTER — Ambulatory Visit: Payer: Medicare Other | Attending: Internal Medicine

## 2019-08-27 ENCOUNTER — Other Ambulatory Visit: Payer: Self-pay

## 2019-08-27 DIAGNOSIS — Z20822 Contact with and (suspected) exposure to covid-19: Secondary | ICD-10-CM

## 2019-08-28 LAB — NOVEL CORONAVIRUS, NAA: SARS-CoV-2, NAA: NOT DETECTED

## 2019-08-30 DIAGNOSIS — M159 Polyosteoarthritis, unspecified: Secondary | ICD-10-CM | POA: Diagnosis not present

## 2019-08-30 DIAGNOSIS — I10 Essential (primary) hypertension: Secondary | ICD-10-CM | POA: Diagnosis not present

## 2019-08-30 DIAGNOSIS — I2510 Atherosclerotic heart disease of native coronary artery without angina pectoris: Secondary | ICD-10-CM | POA: Diagnosis not present

## 2019-08-30 DIAGNOSIS — I4891 Unspecified atrial fibrillation: Secondary | ICD-10-CM | POA: Diagnosis not present

## 2019-09-03 ENCOUNTER — Other Ambulatory Visit: Payer: Medicare Other

## 2019-09-04 DIAGNOSIS — F329 Major depressive disorder, single episode, unspecified: Secondary | ICD-10-CM | POA: Diagnosis not present

## 2019-09-04 DIAGNOSIS — E785 Hyperlipidemia, unspecified: Secondary | ICD-10-CM | POA: Diagnosis not present

## 2019-09-04 DIAGNOSIS — I10 Essential (primary) hypertension: Secondary | ICD-10-CM | POA: Diagnosis not present

## 2019-09-04 DIAGNOSIS — M159 Polyosteoarthritis, unspecified: Secondary | ICD-10-CM | POA: Diagnosis not present

## 2019-09-06 DIAGNOSIS — E785 Hyperlipidemia, unspecified: Secondary | ICD-10-CM | POA: Diagnosis not present

## 2019-09-06 DIAGNOSIS — F329 Major depressive disorder, single episode, unspecified: Secondary | ICD-10-CM | POA: Diagnosis not present

## 2019-09-06 DIAGNOSIS — I10 Essential (primary) hypertension: Secondary | ICD-10-CM | POA: Diagnosis not present

## 2019-09-11 ENCOUNTER — Ambulatory Visit (HOSPITAL_COMMUNITY): Payer: Medicare Other | Admitting: Psychiatry

## 2019-09-13 DIAGNOSIS — B351 Tinea unguium: Secondary | ICD-10-CM | POA: Diagnosis not present

## 2019-09-13 DIAGNOSIS — I70203 Unspecified atherosclerosis of native arteries of extremities, bilateral legs: Secondary | ICD-10-CM | POA: Diagnosis not present

## 2019-09-13 DIAGNOSIS — L600 Ingrowing nail: Secondary | ICD-10-CM | POA: Diagnosis not present

## 2019-09-13 DIAGNOSIS — M79675 Pain in left toe(s): Secondary | ICD-10-CM | POA: Diagnosis not present

## 2019-09-13 DIAGNOSIS — M79674 Pain in right toe(s): Secondary | ICD-10-CM | POA: Diagnosis not present

## 2019-09-17 DIAGNOSIS — Z23 Encounter for immunization: Secondary | ICD-10-CM | POA: Diagnosis not present

## 2019-09-20 ENCOUNTER — Ambulatory Visit: Payer: Medicare Other | Admitting: Neurology

## 2019-09-20 DIAGNOSIS — M159 Polyosteoarthritis, unspecified: Secondary | ICD-10-CM | POA: Diagnosis not present

## 2019-09-20 DIAGNOSIS — G4733 Obstructive sleep apnea (adult) (pediatric): Secondary | ICD-10-CM | POA: Diagnosis not present

## 2019-09-20 DIAGNOSIS — E785 Hyperlipidemia, unspecified: Secondary | ICD-10-CM | POA: Diagnosis not present

## 2019-09-20 DIAGNOSIS — I10 Essential (primary) hypertension: Secondary | ICD-10-CM | POA: Diagnosis not present

## 2019-09-20 DIAGNOSIS — F329 Major depressive disorder, single episode, unspecified: Secondary | ICD-10-CM | POA: Diagnosis not present

## 2019-09-20 DIAGNOSIS — Z9181 History of falling: Secondary | ICD-10-CM | POA: Diagnosis not present

## 2019-09-21 DIAGNOSIS — F329 Major depressive disorder, single episode, unspecified: Secondary | ICD-10-CM | POA: Diagnosis not present

## 2019-09-21 DIAGNOSIS — Z9181 History of falling: Secondary | ICD-10-CM | POA: Diagnosis not present

## 2019-09-21 DIAGNOSIS — M159 Polyosteoarthritis, unspecified: Secondary | ICD-10-CM | POA: Diagnosis not present

## 2019-09-21 DIAGNOSIS — E785 Hyperlipidemia, unspecified: Secondary | ICD-10-CM | POA: Diagnosis not present

## 2019-09-21 DIAGNOSIS — G4733 Obstructive sleep apnea (adult) (pediatric): Secondary | ICD-10-CM | POA: Diagnosis not present

## 2019-09-21 DIAGNOSIS — I10 Essential (primary) hypertension: Secondary | ICD-10-CM | POA: Diagnosis not present

## 2019-09-24 ENCOUNTER — Encounter: Payer: Self-pay | Admitting: Neurology

## 2019-09-25 DIAGNOSIS — G4733 Obstructive sleep apnea (adult) (pediatric): Secondary | ICD-10-CM | POA: Diagnosis not present

## 2019-09-25 DIAGNOSIS — F329 Major depressive disorder, single episode, unspecified: Secondary | ICD-10-CM | POA: Diagnosis not present

## 2019-09-25 DIAGNOSIS — E785 Hyperlipidemia, unspecified: Secondary | ICD-10-CM | POA: Diagnosis not present

## 2019-09-25 DIAGNOSIS — Z9181 History of falling: Secondary | ICD-10-CM | POA: Diagnosis not present

## 2019-09-25 DIAGNOSIS — M159 Polyosteoarthritis, unspecified: Secondary | ICD-10-CM | POA: Diagnosis not present

## 2019-09-25 DIAGNOSIS — I10 Essential (primary) hypertension: Secondary | ICD-10-CM | POA: Diagnosis not present

## 2019-09-26 DIAGNOSIS — M159 Polyosteoarthritis, unspecified: Secondary | ICD-10-CM | POA: Diagnosis not present

## 2019-09-26 DIAGNOSIS — F329 Major depressive disorder, single episode, unspecified: Secondary | ICD-10-CM | POA: Diagnosis not present

## 2019-09-26 DIAGNOSIS — I10 Essential (primary) hypertension: Secondary | ICD-10-CM | POA: Diagnosis not present

## 2019-09-27 DIAGNOSIS — I10 Essential (primary) hypertension: Secondary | ICD-10-CM | POA: Diagnosis not present

## 2019-09-27 DIAGNOSIS — Z9181 History of falling: Secondary | ICD-10-CM | POA: Diagnosis not present

## 2019-09-27 DIAGNOSIS — G4733 Obstructive sleep apnea (adult) (pediatric): Secondary | ICD-10-CM | POA: Diagnosis not present

## 2019-09-27 DIAGNOSIS — M159 Polyosteoarthritis, unspecified: Secondary | ICD-10-CM | POA: Diagnosis not present

## 2019-09-27 DIAGNOSIS — F329 Major depressive disorder, single episode, unspecified: Secondary | ICD-10-CM | POA: Diagnosis not present

## 2019-09-27 DIAGNOSIS — E785 Hyperlipidemia, unspecified: Secondary | ICD-10-CM | POA: Diagnosis not present

## 2019-10-02 DIAGNOSIS — F329 Major depressive disorder, single episode, unspecified: Secondary | ICD-10-CM | POA: Diagnosis not present

## 2019-10-02 DIAGNOSIS — Z9181 History of falling: Secondary | ICD-10-CM | POA: Diagnosis not present

## 2019-10-02 DIAGNOSIS — I10 Essential (primary) hypertension: Secondary | ICD-10-CM | POA: Diagnosis not present

## 2019-10-02 DIAGNOSIS — E785 Hyperlipidemia, unspecified: Secondary | ICD-10-CM | POA: Diagnosis not present

## 2019-10-02 DIAGNOSIS — G4733 Obstructive sleep apnea (adult) (pediatric): Secondary | ICD-10-CM | POA: Diagnosis not present

## 2019-10-02 DIAGNOSIS — M159 Polyosteoarthritis, unspecified: Secondary | ICD-10-CM | POA: Diagnosis not present

## 2019-10-04 DIAGNOSIS — E785 Hyperlipidemia, unspecified: Secondary | ICD-10-CM | POA: Diagnosis not present

## 2019-10-04 DIAGNOSIS — I10 Essential (primary) hypertension: Secondary | ICD-10-CM | POA: Diagnosis not present

## 2019-10-04 DIAGNOSIS — G4733 Obstructive sleep apnea (adult) (pediatric): Secondary | ICD-10-CM | POA: Diagnosis not present

## 2019-10-04 DIAGNOSIS — M159 Polyosteoarthritis, unspecified: Secondary | ICD-10-CM | POA: Diagnosis not present

## 2019-10-04 DIAGNOSIS — Z9181 History of falling: Secondary | ICD-10-CM | POA: Diagnosis not present

## 2019-10-04 DIAGNOSIS — F329 Major depressive disorder, single episode, unspecified: Secondary | ICD-10-CM | POA: Diagnosis not present

## 2019-10-08 DIAGNOSIS — Z23 Encounter for immunization: Secondary | ICD-10-CM | POA: Diagnosis not present

## 2019-10-09 DIAGNOSIS — I10 Essential (primary) hypertension: Secondary | ICD-10-CM | POA: Diagnosis not present

## 2019-10-09 DIAGNOSIS — Z9181 History of falling: Secondary | ICD-10-CM | POA: Diagnosis not present

## 2019-10-09 DIAGNOSIS — F329 Major depressive disorder, single episode, unspecified: Secondary | ICD-10-CM | POA: Diagnosis not present

## 2019-10-09 DIAGNOSIS — M159 Polyosteoarthritis, unspecified: Secondary | ICD-10-CM | POA: Diagnosis not present

## 2019-10-09 DIAGNOSIS — E785 Hyperlipidemia, unspecified: Secondary | ICD-10-CM | POA: Diagnosis not present

## 2019-10-09 DIAGNOSIS — G4733 Obstructive sleep apnea (adult) (pediatric): Secondary | ICD-10-CM | POA: Diagnosis not present

## 2019-10-11 DIAGNOSIS — F329 Major depressive disorder, single episode, unspecified: Secondary | ICD-10-CM | POA: Diagnosis not present

## 2019-10-11 DIAGNOSIS — I10 Essential (primary) hypertension: Secondary | ICD-10-CM | POA: Diagnosis not present

## 2019-10-11 DIAGNOSIS — M159 Polyosteoarthritis, unspecified: Secondary | ICD-10-CM | POA: Diagnosis not present

## 2019-10-11 DIAGNOSIS — E785 Hyperlipidemia, unspecified: Secondary | ICD-10-CM | POA: Diagnosis not present

## 2019-10-11 DIAGNOSIS — Z9181 History of falling: Secondary | ICD-10-CM | POA: Diagnosis not present

## 2019-10-11 DIAGNOSIS — G4733 Obstructive sleep apnea (adult) (pediatric): Secondary | ICD-10-CM | POA: Diagnosis not present

## 2019-10-13 DIAGNOSIS — M329 Systemic lupus erythematosus, unspecified: Secondary | ICD-10-CM | POA: Diagnosis not present

## 2019-10-13 DIAGNOSIS — I10 Essential (primary) hypertension: Secondary | ICD-10-CM | POA: Diagnosis not present

## 2019-10-15 DIAGNOSIS — F329 Major depressive disorder, single episode, unspecified: Secondary | ICD-10-CM | POA: Diagnosis not present

## 2019-10-15 DIAGNOSIS — G4733 Obstructive sleep apnea (adult) (pediatric): Secondary | ICD-10-CM | POA: Diagnosis not present

## 2019-10-15 DIAGNOSIS — I10 Essential (primary) hypertension: Secondary | ICD-10-CM | POA: Diagnosis not present

## 2019-10-15 DIAGNOSIS — Z9181 History of falling: Secondary | ICD-10-CM | POA: Diagnosis not present

## 2019-10-15 DIAGNOSIS — M159 Polyosteoarthritis, unspecified: Secondary | ICD-10-CM | POA: Diagnosis not present

## 2019-10-15 DIAGNOSIS — E785 Hyperlipidemia, unspecified: Secondary | ICD-10-CM | POA: Diagnosis not present

## 2019-10-16 DIAGNOSIS — Z9181 History of falling: Secondary | ICD-10-CM | POA: Diagnosis not present

## 2019-10-16 DIAGNOSIS — G4733 Obstructive sleep apnea (adult) (pediatric): Secondary | ICD-10-CM | POA: Diagnosis not present

## 2019-10-16 DIAGNOSIS — E785 Hyperlipidemia, unspecified: Secondary | ICD-10-CM | POA: Diagnosis not present

## 2019-10-16 DIAGNOSIS — M159 Polyosteoarthritis, unspecified: Secondary | ICD-10-CM | POA: Diagnosis not present

## 2019-10-16 DIAGNOSIS — F329 Major depressive disorder, single episode, unspecified: Secondary | ICD-10-CM | POA: Diagnosis not present

## 2019-10-16 DIAGNOSIS — I10 Essential (primary) hypertension: Secondary | ICD-10-CM | POA: Diagnosis not present

## 2019-10-17 DIAGNOSIS — M159 Polyosteoarthritis, unspecified: Secondary | ICD-10-CM | POA: Diagnosis not present

## 2019-10-17 DIAGNOSIS — E785 Hyperlipidemia, unspecified: Secondary | ICD-10-CM | POA: Diagnosis not present

## 2019-10-17 DIAGNOSIS — R000 Tachycardia, unspecified: Secondary | ICD-10-CM | POA: Diagnosis not present

## 2019-10-17 DIAGNOSIS — G4733 Obstructive sleep apnea (adult) (pediatric): Secondary | ICD-10-CM | POA: Diagnosis not present

## 2019-10-17 DIAGNOSIS — I10 Essential (primary) hypertension: Secondary | ICD-10-CM | POA: Diagnosis not present

## 2019-10-17 DIAGNOSIS — Z9181 History of falling: Secondary | ICD-10-CM | POA: Diagnosis not present

## 2019-10-17 DIAGNOSIS — F329 Major depressive disorder, single episode, unspecified: Secondary | ICD-10-CM | POA: Diagnosis not present

## 2019-10-19 ENCOUNTER — Ambulatory Visit: Payer: Medicare Other | Admitting: Psychology

## 2019-10-19 DIAGNOSIS — F329 Major depressive disorder, single episode, unspecified: Secondary | ICD-10-CM | POA: Diagnosis not present

## 2019-10-20 DIAGNOSIS — G4733 Obstructive sleep apnea (adult) (pediatric): Secondary | ICD-10-CM | POA: Diagnosis not present

## 2019-10-20 DIAGNOSIS — F329 Major depressive disorder, single episode, unspecified: Secondary | ICD-10-CM | POA: Diagnosis not present

## 2019-10-20 DIAGNOSIS — Z9181 History of falling: Secondary | ICD-10-CM | POA: Diagnosis not present

## 2019-10-20 DIAGNOSIS — I10 Essential (primary) hypertension: Secondary | ICD-10-CM | POA: Diagnosis not present

## 2019-10-20 DIAGNOSIS — M159 Polyosteoarthritis, unspecified: Secondary | ICD-10-CM | POA: Diagnosis not present

## 2019-10-20 DIAGNOSIS — E785 Hyperlipidemia, unspecified: Secondary | ICD-10-CM | POA: Diagnosis not present

## 2019-10-26 DIAGNOSIS — F329 Major depressive disorder, single episode, unspecified: Secondary | ICD-10-CM | POA: Diagnosis not present

## 2019-10-26 DIAGNOSIS — M159 Polyosteoarthritis, unspecified: Secondary | ICD-10-CM | POA: Diagnosis not present

## 2019-10-26 DIAGNOSIS — E785 Hyperlipidemia, unspecified: Secondary | ICD-10-CM | POA: Diagnosis not present

## 2019-10-26 DIAGNOSIS — G4733 Obstructive sleep apnea (adult) (pediatric): Secondary | ICD-10-CM | POA: Diagnosis not present

## 2019-10-26 DIAGNOSIS — I10 Essential (primary) hypertension: Secondary | ICD-10-CM | POA: Diagnosis not present

## 2019-10-26 DIAGNOSIS — Z9181 History of falling: Secondary | ICD-10-CM | POA: Diagnosis not present

## 2019-10-27 DIAGNOSIS — I10 Essential (primary) hypertension: Secondary | ICD-10-CM | POA: Diagnosis not present

## 2019-10-27 DIAGNOSIS — F329 Major depressive disorder, single episode, unspecified: Secondary | ICD-10-CM | POA: Diagnosis not present

## 2019-10-27 DIAGNOSIS — Z9181 History of falling: Secondary | ICD-10-CM | POA: Diagnosis not present

## 2019-10-27 DIAGNOSIS — E785 Hyperlipidemia, unspecified: Secondary | ICD-10-CM | POA: Diagnosis not present

## 2019-10-27 DIAGNOSIS — G4733 Obstructive sleep apnea (adult) (pediatric): Secondary | ICD-10-CM | POA: Diagnosis not present

## 2019-10-27 DIAGNOSIS — M159 Polyosteoarthritis, unspecified: Secondary | ICD-10-CM | POA: Diagnosis not present

## 2019-10-29 DIAGNOSIS — I10 Essential (primary) hypertension: Secondary | ICD-10-CM | POA: Diagnosis not present

## 2019-10-29 DIAGNOSIS — M159 Polyosteoarthritis, unspecified: Secondary | ICD-10-CM | POA: Diagnosis not present

## 2019-10-29 DIAGNOSIS — G4733 Obstructive sleep apnea (adult) (pediatric): Secondary | ICD-10-CM | POA: Diagnosis not present

## 2019-10-29 DIAGNOSIS — E785 Hyperlipidemia, unspecified: Secondary | ICD-10-CM | POA: Diagnosis not present

## 2019-10-29 DIAGNOSIS — Z9181 History of falling: Secondary | ICD-10-CM | POA: Diagnosis not present

## 2019-10-29 DIAGNOSIS — F329 Major depressive disorder, single episode, unspecified: Secondary | ICD-10-CM | POA: Diagnosis not present

## 2019-10-30 DIAGNOSIS — G4733 Obstructive sleep apnea (adult) (pediatric): Secondary | ICD-10-CM | POA: Diagnosis not present

## 2019-10-30 DIAGNOSIS — F329 Major depressive disorder, single episode, unspecified: Secondary | ICD-10-CM | POA: Diagnosis not present

## 2019-10-30 DIAGNOSIS — Z9181 History of falling: Secondary | ICD-10-CM | POA: Diagnosis not present

## 2019-10-30 DIAGNOSIS — I10 Essential (primary) hypertension: Secondary | ICD-10-CM | POA: Diagnosis not present

## 2019-10-30 DIAGNOSIS — E785 Hyperlipidemia, unspecified: Secondary | ICD-10-CM | POA: Diagnosis not present

## 2019-10-30 DIAGNOSIS — M159 Polyosteoarthritis, unspecified: Secondary | ICD-10-CM | POA: Diagnosis not present

## 2019-10-31 DIAGNOSIS — L89602 Pressure ulcer of unspecified heel, stage 2: Secondary | ICD-10-CM | POA: Diagnosis not present

## 2019-11-01 DIAGNOSIS — F329 Major depressive disorder, single episode, unspecified: Secondary | ICD-10-CM | POA: Diagnosis not present

## 2019-11-01 DIAGNOSIS — Z9181 History of falling: Secondary | ICD-10-CM | POA: Diagnosis not present

## 2019-11-01 DIAGNOSIS — G4733 Obstructive sleep apnea (adult) (pediatric): Secondary | ICD-10-CM | POA: Diagnosis not present

## 2019-11-01 DIAGNOSIS — I10 Essential (primary) hypertension: Secondary | ICD-10-CM | POA: Diagnosis not present

## 2019-11-01 DIAGNOSIS — E785 Hyperlipidemia, unspecified: Secondary | ICD-10-CM | POA: Diagnosis not present

## 2019-11-01 DIAGNOSIS — M159 Polyosteoarthritis, unspecified: Secondary | ICD-10-CM | POA: Diagnosis not present

## 2019-11-03 DIAGNOSIS — G4733 Obstructive sleep apnea (adult) (pediatric): Secondary | ICD-10-CM | POA: Diagnosis not present

## 2019-11-03 DIAGNOSIS — Z9181 History of falling: Secondary | ICD-10-CM | POA: Diagnosis not present

## 2019-11-03 DIAGNOSIS — M159 Polyosteoarthritis, unspecified: Secondary | ICD-10-CM | POA: Diagnosis not present

## 2019-11-03 DIAGNOSIS — I10 Essential (primary) hypertension: Secondary | ICD-10-CM | POA: Diagnosis not present

## 2019-11-03 DIAGNOSIS — F329 Major depressive disorder, single episode, unspecified: Secondary | ICD-10-CM | POA: Diagnosis not present

## 2019-11-03 DIAGNOSIS — E785 Hyperlipidemia, unspecified: Secondary | ICD-10-CM | POA: Diagnosis not present

## 2019-11-05 DIAGNOSIS — Z9181 History of falling: Secondary | ICD-10-CM | POA: Diagnosis not present

## 2019-11-05 DIAGNOSIS — F329 Major depressive disorder, single episode, unspecified: Secondary | ICD-10-CM | POA: Diagnosis not present

## 2019-11-05 DIAGNOSIS — G4733 Obstructive sleep apnea (adult) (pediatric): Secondary | ICD-10-CM | POA: Diagnosis not present

## 2019-11-05 DIAGNOSIS — E785 Hyperlipidemia, unspecified: Secondary | ICD-10-CM | POA: Diagnosis not present

## 2019-11-05 DIAGNOSIS — I10 Essential (primary) hypertension: Secondary | ICD-10-CM | POA: Diagnosis not present

## 2019-11-05 DIAGNOSIS — M159 Polyosteoarthritis, unspecified: Secondary | ICD-10-CM | POA: Diagnosis not present

## 2019-11-06 DIAGNOSIS — F329 Major depressive disorder, single episode, unspecified: Secondary | ICD-10-CM | POA: Diagnosis not present

## 2019-11-06 DIAGNOSIS — I10 Essential (primary) hypertension: Secondary | ICD-10-CM | POA: Diagnosis not present

## 2019-11-06 DIAGNOSIS — M159 Polyosteoarthritis, unspecified: Secondary | ICD-10-CM | POA: Diagnosis not present

## 2019-11-06 DIAGNOSIS — G4733 Obstructive sleep apnea (adult) (pediatric): Secondary | ICD-10-CM | POA: Diagnosis not present

## 2019-11-06 DIAGNOSIS — E785 Hyperlipidemia, unspecified: Secondary | ICD-10-CM | POA: Diagnosis not present

## 2019-11-06 DIAGNOSIS — Z9181 History of falling: Secondary | ICD-10-CM | POA: Diagnosis not present

## 2019-11-07 DIAGNOSIS — M159 Polyosteoarthritis, unspecified: Secondary | ICD-10-CM | POA: Diagnosis not present

## 2019-11-07 DIAGNOSIS — F329 Major depressive disorder, single episode, unspecified: Secondary | ICD-10-CM | POA: Diagnosis not present

## 2019-11-07 DIAGNOSIS — Z9181 History of falling: Secondary | ICD-10-CM | POA: Diagnosis not present

## 2019-11-07 DIAGNOSIS — I10 Essential (primary) hypertension: Secondary | ICD-10-CM | POA: Diagnosis not present

## 2019-11-07 DIAGNOSIS — E785 Hyperlipidemia, unspecified: Secondary | ICD-10-CM | POA: Diagnosis not present

## 2019-11-07 DIAGNOSIS — G4733 Obstructive sleep apnea (adult) (pediatric): Secondary | ICD-10-CM | POA: Diagnosis not present

## 2019-11-08 DIAGNOSIS — E785 Hyperlipidemia, unspecified: Secondary | ICD-10-CM | POA: Diagnosis not present

## 2019-11-08 DIAGNOSIS — I10 Essential (primary) hypertension: Secondary | ICD-10-CM | POA: Diagnosis not present

## 2019-11-08 DIAGNOSIS — G4733 Obstructive sleep apnea (adult) (pediatric): Secondary | ICD-10-CM | POA: Diagnosis not present

## 2019-11-08 DIAGNOSIS — F329 Major depressive disorder, single episode, unspecified: Secondary | ICD-10-CM | POA: Diagnosis not present

## 2019-11-08 DIAGNOSIS — Z9181 History of falling: Secondary | ICD-10-CM | POA: Diagnosis not present

## 2019-11-08 DIAGNOSIS — M159 Polyosteoarthritis, unspecified: Secondary | ICD-10-CM | POA: Diagnosis not present

## 2019-11-09 DIAGNOSIS — L89602 Pressure ulcer of unspecified heel, stage 2: Secondary | ICD-10-CM | POA: Diagnosis not present

## 2019-11-09 DIAGNOSIS — G4733 Obstructive sleep apnea (adult) (pediatric): Secondary | ICD-10-CM | POA: Diagnosis not present

## 2019-11-09 DIAGNOSIS — F329 Major depressive disorder, single episode, unspecified: Secondary | ICD-10-CM | POA: Diagnosis not present

## 2019-11-09 DIAGNOSIS — E785 Hyperlipidemia, unspecified: Secondary | ICD-10-CM | POA: Diagnosis not present

## 2019-11-09 DIAGNOSIS — M159 Polyosteoarthritis, unspecified: Secondary | ICD-10-CM | POA: Diagnosis not present

## 2019-11-09 DIAGNOSIS — I10 Essential (primary) hypertension: Secondary | ICD-10-CM | POA: Diagnosis not present

## 2019-11-09 DIAGNOSIS — Z9181 History of falling: Secondary | ICD-10-CM | POA: Diagnosis not present

## 2019-11-12 DIAGNOSIS — F329 Major depressive disorder, single episode, unspecified: Secondary | ICD-10-CM | POA: Diagnosis not present

## 2019-11-12 DIAGNOSIS — E785 Hyperlipidemia, unspecified: Secondary | ICD-10-CM | POA: Diagnosis not present

## 2019-11-12 DIAGNOSIS — G4733 Obstructive sleep apnea (adult) (pediatric): Secondary | ICD-10-CM | POA: Diagnosis not present

## 2019-11-12 DIAGNOSIS — I10 Essential (primary) hypertension: Secondary | ICD-10-CM | POA: Diagnosis not present

## 2019-11-12 DIAGNOSIS — Z9181 History of falling: Secondary | ICD-10-CM | POA: Diagnosis not present

## 2019-11-12 DIAGNOSIS — M159 Polyosteoarthritis, unspecified: Secondary | ICD-10-CM | POA: Diagnosis not present

## 2019-11-12 IMAGING — MR MR HEAD W/O CM
7 of 12 series · 24 of 48 positions shown · non-contrast
Comparison: None.

CLINICAL DATA: Memory loss.  History seizure.

EXAM:
MRI HEAD WITHOUT CONTRAST
TECHNIQUE: Multiplanar, multiecho pulse sequences of the brain and surrounding
structures were obtained without intravenous contrast.

[Series 4: DWI · axial · 3.0mm · 0.74mm/px · z∈[-93,+68]mm · 6 of 55 slices shown (1 of 2)]
[im 1/55]
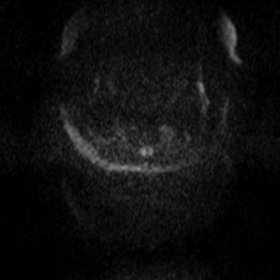
[im 11/55]
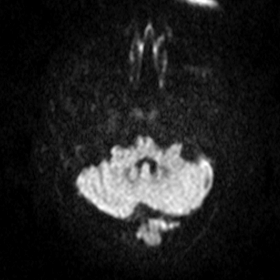
[im 22/55]
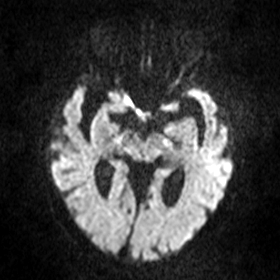
[im 33/55]
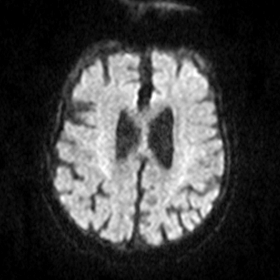
[im 44/55]
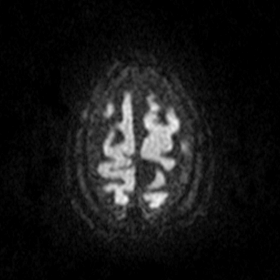
[im 55/55]
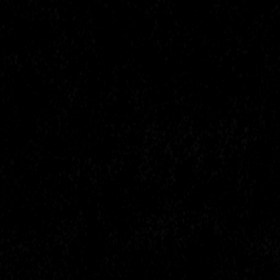

[Series 6: DWI · coronal · 5.0mm · 0.51mm/px · 4 of 38 slices shown (2 of 2)]
[im 1/38]
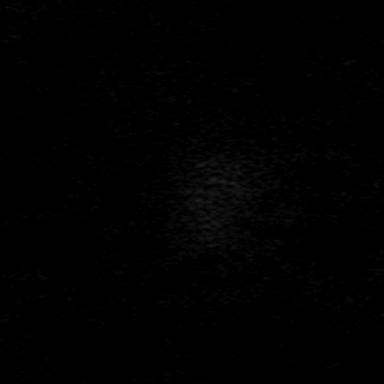
[im 13/38]
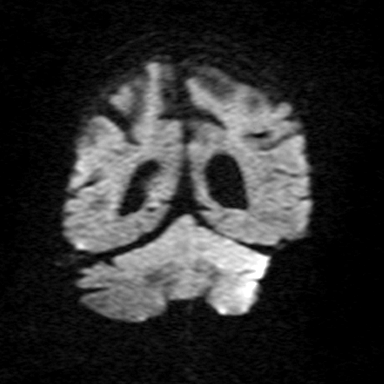
[im 25/38]
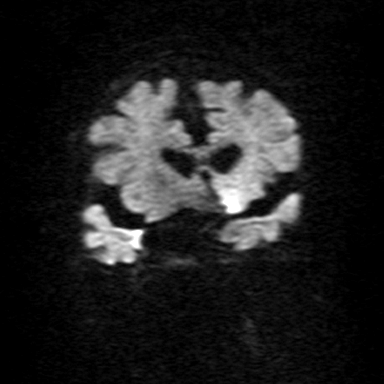
[im 38/38]
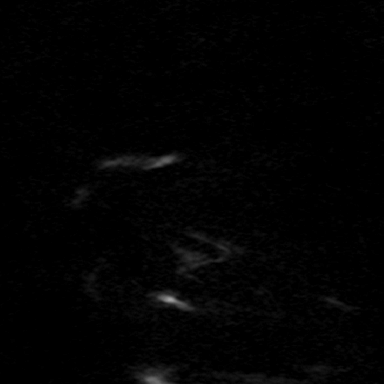

[Series 8: T2 · axial · 5.0mm · 0.46mm/px · z∈[-87,+55]mm · 2 of 23 slices shown (1 of 3)]
[im 1/23]
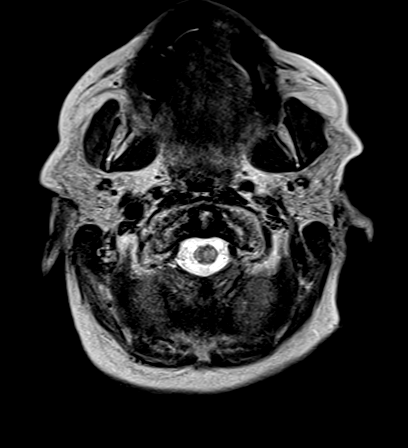
[im 23/23]
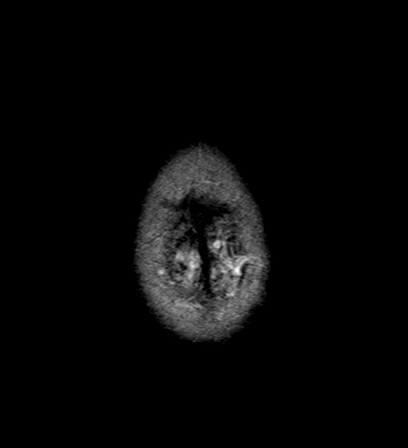

[Series 9: T2 · axial · 5.0mm · 0.40mm/px · z∈[-87,+54]mm · 2 of 23 slices shown (2 of 3)]
[im 1/23]
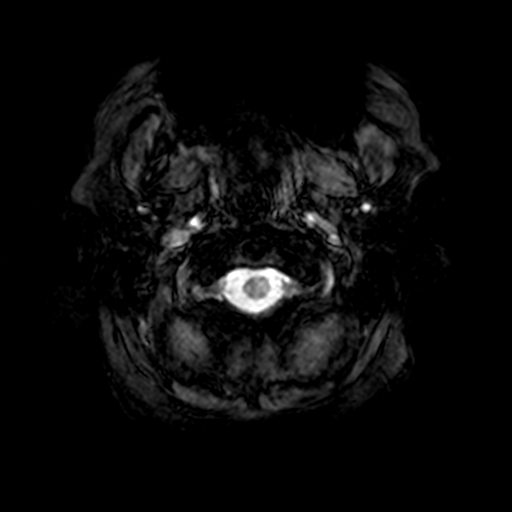
[im 23/23]
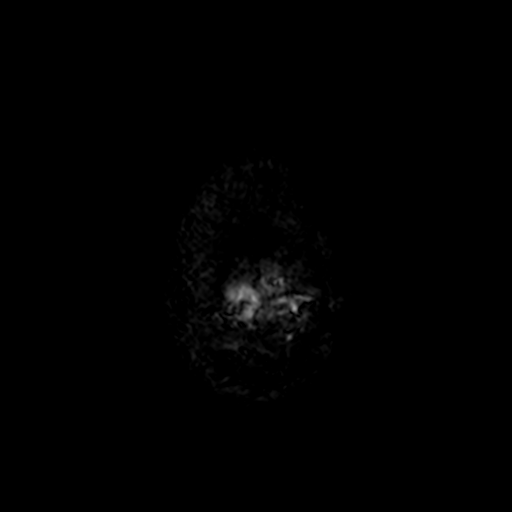

[Series 10: FLAIR · axial · 3.0mm · 0.31mm/px · z∈[-93,+59]mm · 5 of 52 slices shown (1 of 2)]
[im 1/52]
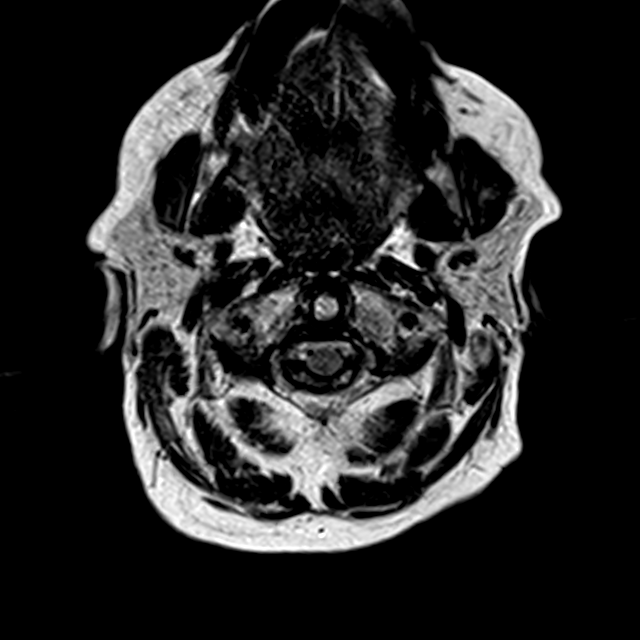
[im 13/52]
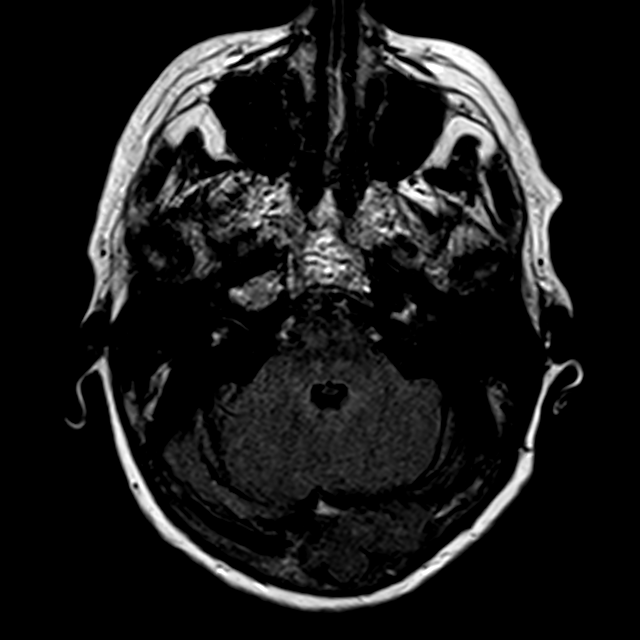
[im 26/52]
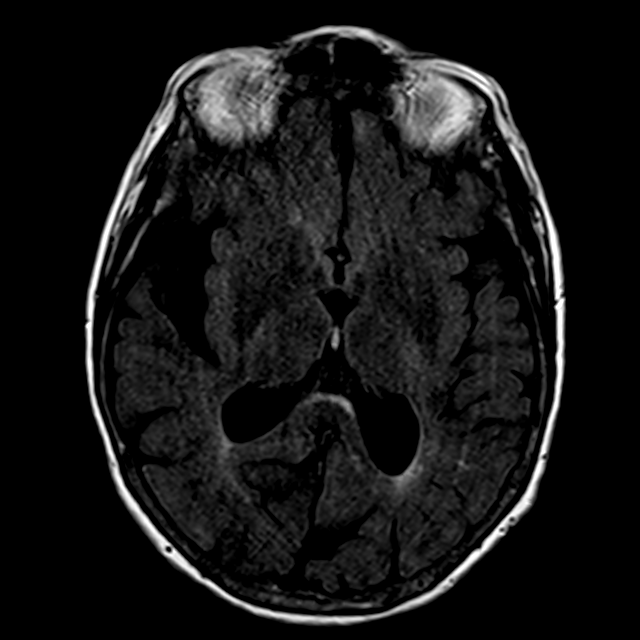
[im 39/52]
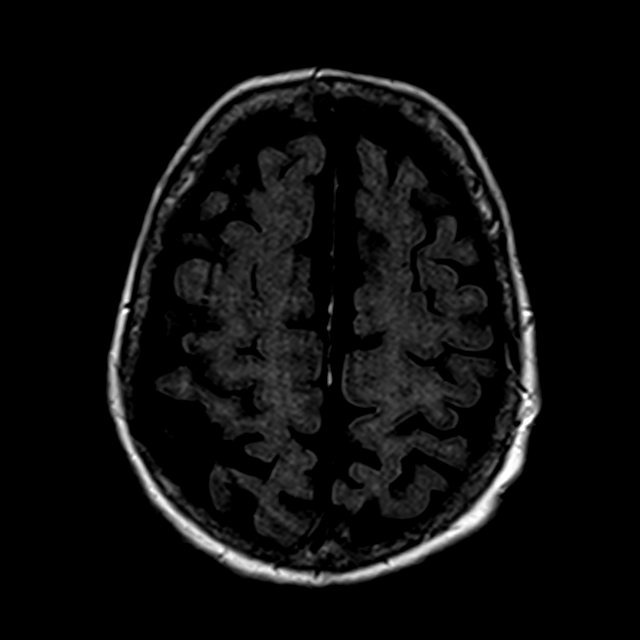
[im 52/52]
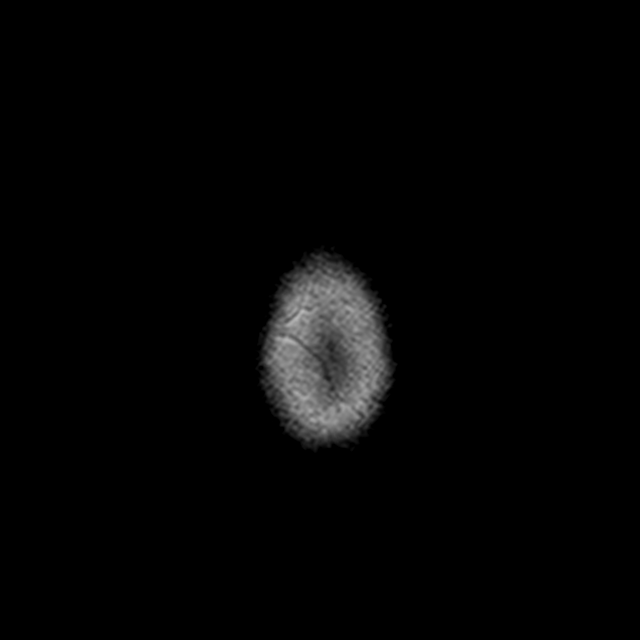

[Series 12: T2 · coronal · 5.0mm · 0.42mm/px · 2 of 30 slices shown (3 of 3)]
[im 1/30]
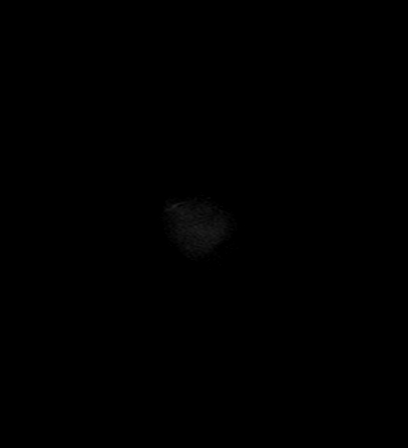
[im 15/30]
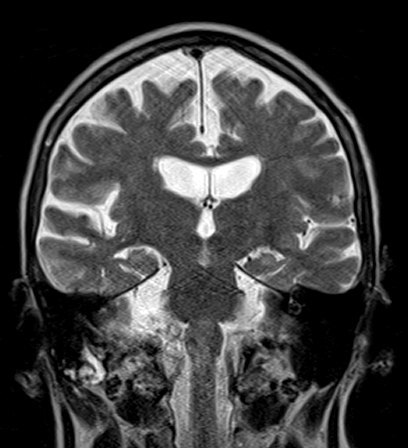

[Series 14: FLAIR · coronal · 3.0mm · 0.27mm/px · 3 of 32 slices shown (2 of 2)]
[im 1/32]
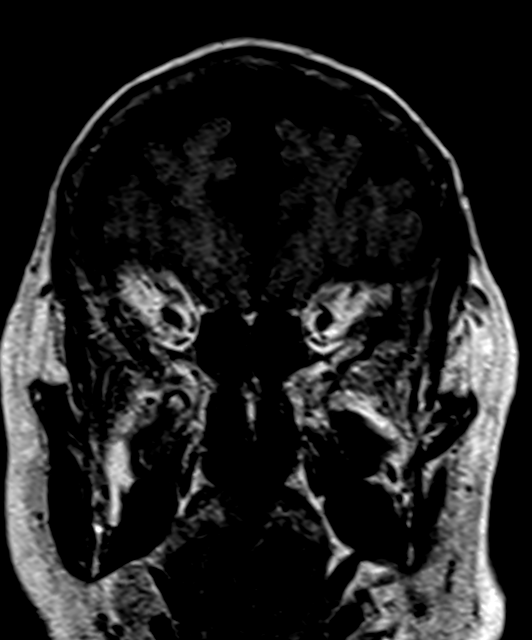
[im 16/32]
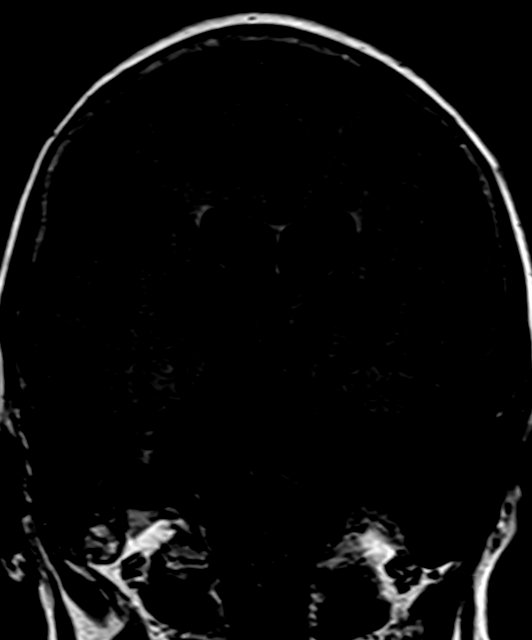
[im 32/32]
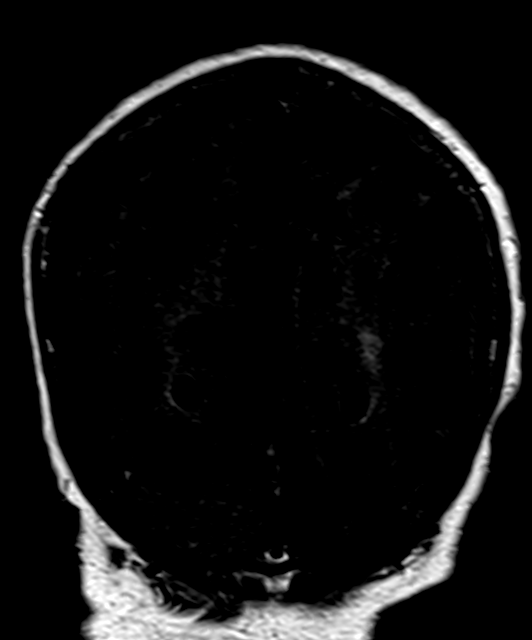

[24 of 48 positions shown; findings below may reference images not displayed]

FINDINGS: Brain: Generalized atrophy without hydrocephalus. Hippocampal
atrophy is noted bilaterally on coronal images.

Negative for acute infarct. Mild chronic white matter changes.
Negative for hemorrhage or mass. No midline shift.

Vascular: Normal arterial flow voids

Skull and upper cervical spine: Negative

Sinuses/Orbits: Paranasal sinuses clear. Right mastoid effusion.
Bilateral cataract surgery

Other: None
IMPRESSION: Cerebral atrophy.  Hippocampal atrophy

No acute abnormality.  Minimal chronic white matter changes.

## 2019-11-13 DIAGNOSIS — M159 Polyosteoarthritis, unspecified: Secondary | ICD-10-CM | POA: Diagnosis not present

## 2019-11-13 DIAGNOSIS — F329 Major depressive disorder, single episode, unspecified: Secondary | ICD-10-CM | POA: Diagnosis not present

## 2019-11-13 DIAGNOSIS — G4733 Obstructive sleep apnea (adult) (pediatric): Secondary | ICD-10-CM | POA: Diagnosis not present

## 2019-11-13 DIAGNOSIS — Z9181 History of falling: Secondary | ICD-10-CM | POA: Diagnosis not present

## 2019-11-13 DIAGNOSIS — I10 Essential (primary) hypertension: Secondary | ICD-10-CM | POA: Diagnosis not present

## 2019-11-13 DIAGNOSIS — E785 Hyperlipidemia, unspecified: Secondary | ICD-10-CM | POA: Diagnosis not present

## 2019-11-14 DIAGNOSIS — M159 Polyosteoarthritis, unspecified: Secondary | ICD-10-CM | POA: Diagnosis not present

## 2019-11-14 DIAGNOSIS — G4733 Obstructive sleep apnea (adult) (pediatric): Secondary | ICD-10-CM | POA: Diagnosis not present

## 2019-11-14 DIAGNOSIS — F329 Major depressive disorder, single episode, unspecified: Secondary | ICD-10-CM | POA: Diagnosis not present

## 2019-11-14 DIAGNOSIS — Z9181 History of falling: Secondary | ICD-10-CM | POA: Diagnosis not present

## 2019-11-14 DIAGNOSIS — I10 Essential (primary) hypertension: Secondary | ICD-10-CM | POA: Diagnosis not present

## 2019-11-14 DIAGNOSIS — E785 Hyperlipidemia, unspecified: Secondary | ICD-10-CM | POA: Diagnosis not present

## 2019-11-15 DIAGNOSIS — G4733 Obstructive sleep apnea (adult) (pediatric): Secondary | ICD-10-CM | POA: Diagnosis not present

## 2019-11-15 DIAGNOSIS — Z9181 History of falling: Secondary | ICD-10-CM | POA: Diagnosis not present

## 2019-11-15 DIAGNOSIS — I10 Essential (primary) hypertension: Secondary | ICD-10-CM | POA: Diagnosis not present

## 2019-11-15 DIAGNOSIS — E785 Hyperlipidemia, unspecified: Secondary | ICD-10-CM | POA: Diagnosis not present

## 2019-11-15 DIAGNOSIS — F329 Major depressive disorder, single episode, unspecified: Secondary | ICD-10-CM | POA: Diagnosis not present

## 2019-11-15 DIAGNOSIS — M159 Polyosteoarthritis, unspecified: Secondary | ICD-10-CM | POA: Diagnosis not present

## 2019-11-16 DIAGNOSIS — E785 Hyperlipidemia, unspecified: Secondary | ICD-10-CM | POA: Diagnosis not present

## 2019-11-16 DIAGNOSIS — G4733 Obstructive sleep apnea (adult) (pediatric): Secondary | ICD-10-CM | POA: Diagnosis not present

## 2019-11-16 DIAGNOSIS — I10 Essential (primary) hypertension: Secondary | ICD-10-CM | POA: Diagnosis not present

## 2019-11-16 DIAGNOSIS — Z9181 History of falling: Secondary | ICD-10-CM | POA: Diagnosis not present

## 2019-11-16 DIAGNOSIS — F329 Major depressive disorder, single episode, unspecified: Secondary | ICD-10-CM | POA: Diagnosis not present

## 2019-11-16 DIAGNOSIS — M159 Polyosteoarthritis, unspecified: Secondary | ICD-10-CM | POA: Diagnosis not present

## 2019-11-19 DIAGNOSIS — G4733 Obstructive sleep apnea (adult) (pediatric): Secondary | ICD-10-CM | POA: Diagnosis not present

## 2019-11-19 DIAGNOSIS — L89602 Pressure ulcer of unspecified heel, stage 2: Secondary | ICD-10-CM | POA: Diagnosis not present

## 2019-11-19 DIAGNOSIS — Z9181 History of falling: Secondary | ICD-10-CM | POA: Diagnosis not present

## 2019-11-19 DIAGNOSIS — M159 Polyosteoarthritis, unspecified: Secondary | ICD-10-CM | POA: Diagnosis not present

## 2019-11-19 DIAGNOSIS — F329 Major depressive disorder, single episode, unspecified: Secondary | ICD-10-CM | POA: Diagnosis not present

## 2019-11-19 DIAGNOSIS — L89622 Pressure ulcer of left heel, stage 2: Secondary | ICD-10-CM | POA: Diagnosis not present

## 2019-11-19 DIAGNOSIS — E669 Obesity, unspecified: Secondary | ICD-10-CM | POA: Diagnosis not present

## 2019-11-19 DIAGNOSIS — E785 Hyperlipidemia, unspecified: Secondary | ICD-10-CM | POA: Diagnosis not present

## 2019-11-19 DIAGNOSIS — I10 Essential (primary) hypertension: Secondary | ICD-10-CM | POA: Diagnosis not present

## 2019-11-20 DIAGNOSIS — G4733 Obstructive sleep apnea (adult) (pediatric): Secondary | ICD-10-CM | POA: Diagnosis not present

## 2019-11-20 DIAGNOSIS — F329 Major depressive disorder, single episode, unspecified: Secondary | ICD-10-CM | POA: Diagnosis not present

## 2019-11-20 DIAGNOSIS — L89622 Pressure ulcer of left heel, stage 2: Secondary | ICD-10-CM | POA: Diagnosis not present

## 2019-11-20 DIAGNOSIS — E669 Obesity, unspecified: Secondary | ICD-10-CM | POA: Diagnosis not present

## 2019-11-20 DIAGNOSIS — M159 Polyosteoarthritis, unspecified: Secondary | ICD-10-CM | POA: Diagnosis not present

## 2019-11-20 DIAGNOSIS — I10 Essential (primary) hypertension: Secondary | ICD-10-CM | POA: Diagnosis not present

## 2019-11-21 DIAGNOSIS — L89622 Pressure ulcer of left heel, stage 2: Secondary | ICD-10-CM | POA: Diagnosis not present

## 2019-11-21 DIAGNOSIS — F329 Major depressive disorder, single episode, unspecified: Secondary | ICD-10-CM | POA: Diagnosis not present

## 2019-11-21 DIAGNOSIS — G4733 Obstructive sleep apnea (adult) (pediatric): Secondary | ICD-10-CM | POA: Diagnosis not present

## 2019-11-21 DIAGNOSIS — E669 Obesity, unspecified: Secondary | ICD-10-CM | POA: Diagnosis not present

## 2019-11-21 DIAGNOSIS — I10 Essential (primary) hypertension: Secondary | ICD-10-CM | POA: Diagnosis not present

## 2019-11-21 DIAGNOSIS — M159 Polyosteoarthritis, unspecified: Secondary | ICD-10-CM | POA: Diagnosis not present

## 2019-11-22 DIAGNOSIS — G4733 Obstructive sleep apnea (adult) (pediatric): Secondary | ICD-10-CM | POA: Diagnosis not present

## 2019-11-22 DIAGNOSIS — F329 Major depressive disorder, single episode, unspecified: Secondary | ICD-10-CM | POA: Diagnosis not present

## 2019-11-22 DIAGNOSIS — E669 Obesity, unspecified: Secondary | ICD-10-CM | POA: Diagnosis not present

## 2019-11-22 DIAGNOSIS — L89622 Pressure ulcer of left heel, stage 2: Secondary | ICD-10-CM | POA: Diagnosis not present

## 2019-11-22 DIAGNOSIS — I10 Essential (primary) hypertension: Secondary | ICD-10-CM | POA: Diagnosis not present

## 2019-11-22 DIAGNOSIS — M159 Polyosteoarthritis, unspecified: Secondary | ICD-10-CM | POA: Diagnosis not present

## 2019-11-26 DIAGNOSIS — I10 Essential (primary) hypertension: Secondary | ICD-10-CM | POA: Diagnosis not present

## 2019-11-26 DIAGNOSIS — G4733 Obstructive sleep apnea (adult) (pediatric): Secondary | ICD-10-CM | POA: Diagnosis not present

## 2019-11-26 DIAGNOSIS — L89622 Pressure ulcer of left heel, stage 2: Secondary | ICD-10-CM | POA: Diagnosis not present

## 2019-11-26 DIAGNOSIS — M79674 Pain in right toe(s): Secondary | ICD-10-CM | POA: Diagnosis not present

## 2019-11-26 DIAGNOSIS — E669 Obesity, unspecified: Secondary | ICD-10-CM | POA: Diagnosis not present

## 2019-11-26 DIAGNOSIS — B351 Tinea unguium: Secondary | ICD-10-CM | POA: Diagnosis not present

## 2019-11-26 DIAGNOSIS — F329 Major depressive disorder, single episode, unspecified: Secondary | ICD-10-CM | POA: Diagnosis not present

## 2019-11-26 DIAGNOSIS — I70203 Unspecified atherosclerosis of native arteries of extremities, bilateral legs: Secondary | ICD-10-CM | POA: Diagnosis not present

## 2019-11-26 DIAGNOSIS — M79675 Pain in left toe(s): Secondary | ICD-10-CM | POA: Diagnosis not present

## 2019-11-26 DIAGNOSIS — M159 Polyosteoarthritis, unspecified: Secondary | ICD-10-CM | POA: Diagnosis not present

## 2019-11-26 DIAGNOSIS — L600 Ingrowing nail: Secondary | ICD-10-CM | POA: Diagnosis not present

## 2019-11-27 DIAGNOSIS — M159 Polyosteoarthritis, unspecified: Secondary | ICD-10-CM | POA: Diagnosis not present

## 2019-11-27 DIAGNOSIS — F329 Major depressive disorder, single episode, unspecified: Secondary | ICD-10-CM | POA: Diagnosis not present

## 2019-11-27 DIAGNOSIS — I10 Essential (primary) hypertension: Secondary | ICD-10-CM | POA: Diagnosis not present

## 2019-11-27 DIAGNOSIS — L89622 Pressure ulcer of left heel, stage 2: Secondary | ICD-10-CM | POA: Diagnosis not present

## 2019-11-27 DIAGNOSIS — E669 Obesity, unspecified: Secondary | ICD-10-CM | POA: Diagnosis not present

## 2019-11-27 DIAGNOSIS — G4733 Obstructive sleep apnea (adult) (pediatric): Secondary | ICD-10-CM | POA: Diagnosis not present

## 2019-11-28 DIAGNOSIS — I10 Essential (primary) hypertension: Secondary | ICD-10-CM | POA: Diagnosis not present

## 2019-11-28 DIAGNOSIS — E669 Obesity, unspecified: Secondary | ICD-10-CM | POA: Diagnosis not present

## 2019-11-28 DIAGNOSIS — L89602 Pressure ulcer of unspecified heel, stage 2: Secondary | ICD-10-CM | POA: Diagnosis not present

## 2019-11-28 DIAGNOSIS — F329 Major depressive disorder, single episode, unspecified: Secondary | ICD-10-CM | POA: Diagnosis not present

## 2019-11-28 DIAGNOSIS — M159 Polyosteoarthritis, unspecified: Secondary | ICD-10-CM | POA: Diagnosis not present

## 2019-11-28 DIAGNOSIS — G4733 Obstructive sleep apnea (adult) (pediatric): Secondary | ICD-10-CM | POA: Diagnosis not present

## 2019-11-28 DIAGNOSIS — L89622 Pressure ulcer of left heel, stage 2: Secondary | ICD-10-CM | POA: Diagnosis not present

## 2019-11-28 DIAGNOSIS — K5900 Constipation, unspecified: Secondary | ICD-10-CM | POA: Diagnosis not present

## 2019-11-29 DIAGNOSIS — M159 Polyosteoarthritis, unspecified: Secondary | ICD-10-CM | POA: Diagnosis not present

## 2019-11-29 DIAGNOSIS — I10 Essential (primary) hypertension: Secondary | ICD-10-CM | POA: Diagnosis not present

## 2019-11-29 DIAGNOSIS — L89622 Pressure ulcer of left heel, stage 2: Secondary | ICD-10-CM | POA: Diagnosis not present

## 2019-11-29 DIAGNOSIS — G4733 Obstructive sleep apnea (adult) (pediatric): Secondary | ICD-10-CM | POA: Diagnosis not present

## 2019-11-29 DIAGNOSIS — F329 Major depressive disorder, single episode, unspecified: Secondary | ICD-10-CM | POA: Diagnosis not present

## 2019-11-29 DIAGNOSIS — E669 Obesity, unspecified: Secondary | ICD-10-CM | POA: Diagnosis not present

## 2019-12-04 DIAGNOSIS — M159 Polyosteoarthritis, unspecified: Secondary | ICD-10-CM | POA: Diagnosis not present

## 2019-12-04 DIAGNOSIS — E669 Obesity, unspecified: Secondary | ICD-10-CM | POA: Diagnosis not present

## 2019-12-04 DIAGNOSIS — G4733 Obstructive sleep apnea (adult) (pediatric): Secondary | ICD-10-CM | POA: Diagnosis not present

## 2019-12-04 DIAGNOSIS — I10 Essential (primary) hypertension: Secondary | ICD-10-CM | POA: Diagnosis not present

## 2019-12-04 DIAGNOSIS — F329 Major depressive disorder, single episode, unspecified: Secondary | ICD-10-CM | POA: Diagnosis not present

## 2019-12-04 DIAGNOSIS — L89622 Pressure ulcer of left heel, stage 2: Secondary | ICD-10-CM | POA: Diagnosis not present

## 2019-12-05 DIAGNOSIS — E669 Obesity, unspecified: Secondary | ICD-10-CM | POA: Diagnosis not present

## 2019-12-05 DIAGNOSIS — M159 Polyosteoarthritis, unspecified: Secondary | ICD-10-CM | POA: Diagnosis not present

## 2019-12-05 DIAGNOSIS — L89622 Pressure ulcer of left heel, stage 2: Secondary | ICD-10-CM | POA: Diagnosis not present

## 2019-12-05 DIAGNOSIS — I10 Essential (primary) hypertension: Secondary | ICD-10-CM | POA: Diagnosis not present

## 2019-12-05 DIAGNOSIS — E785 Hyperlipidemia, unspecified: Secondary | ICD-10-CM | POA: Diagnosis not present

## 2019-12-05 DIAGNOSIS — F329 Major depressive disorder, single episode, unspecified: Secondary | ICD-10-CM | POA: Diagnosis not present

## 2019-12-05 DIAGNOSIS — G4733 Obstructive sleep apnea (adult) (pediatric): Secondary | ICD-10-CM | POA: Diagnosis not present

## 2019-12-05 DIAGNOSIS — R000 Tachycardia, unspecified: Secondary | ICD-10-CM | POA: Diagnosis not present

## 2019-12-06 DIAGNOSIS — I10 Essential (primary) hypertension: Secondary | ICD-10-CM | POA: Diagnosis not present

## 2019-12-06 DIAGNOSIS — L89622 Pressure ulcer of left heel, stage 2: Secondary | ICD-10-CM | POA: Diagnosis not present

## 2019-12-06 DIAGNOSIS — F329 Major depressive disorder, single episode, unspecified: Secondary | ICD-10-CM | POA: Diagnosis not present

## 2019-12-06 DIAGNOSIS — G4733 Obstructive sleep apnea (adult) (pediatric): Secondary | ICD-10-CM | POA: Diagnosis not present

## 2019-12-06 DIAGNOSIS — E669 Obesity, unspecified: Secondary | ICD-10-CM | POA: Diagnosis not present

## 2019-12-06 DIAGNOSIS — M159 Polyosteoarthritis, unspecified: Secondary | ICD-10-CM | POA: Diagnosis not present

## 2019-12-10 DIAGNOSIS — M159 Polyosteoarthritis, unspecified: Secondary | ICD-10-CM | POA: Diagnosis not present

## 2019-12-10 DIAGNOSIS — E669 Obesity, unspecified: Secondary | ICD-10-CM | POA: Diagnosis not present

## 2019-12-10 DIAGNOSIS — G4733 Obstructive sleep apnea (adult) (pediatric): Secondary | ICD-10-CM | POA: Diagnosis not present

## 2019-12-10 DIAGNOSIS — I10 Essential (primary) hypertension: Secondary | ICD-10-CM | POA: Diagnosis not present

## 2019-12-10 DIAGNOSIS — L89622 Pressure ulcer of left heel, stage 2: Secondary | ICD-10-CM | POA: Diagnosis not present

## 2019-12-10 DIAGNOSIS — F329 Major depressive disorder, single episode, unspecified: Secondary | ICD-10-CM | POA: Diagnosis not present

## 2019-12-11 DIAGNOSIS — F329 Major depressive disorder, single episode, unspecified: Secondary | ICD-10-CM | POA: Diagnosis not present

## 2019-12-11 DIAGNOSIS — L89622 Pressure ulcer of left heel, stage 2: Secondary | ICD-10-CM | POA: Diagnosis not present

## 2019-12-11 DIAGNOSIS — I10 Essential (primary) hypertension: Secondary | ICD-10-CM | POA: Diagnosis not present

## 2019-12-11 DIAGNOSIS — E669 Obesity, unspecified: Secondary | ICD-10-CM | POA: Diagnosis not present

## 2019-12-11 DIAGNOSIS — G4733 Obstructive sleep apnea (adult) (pediatric): Secondary | ICD-10-CM | POA: Diagnosis not present

## 2019-12-11 DIAGNOSIS — M159 Polyosteoarthritis, unspecified: Secondary | ICD-10-CM | POA: Diagnosis not present

## 2019-12-13 DIAGNOSIS — M159 Polyosteoarthritis, unspecified: Secondary | ICD-10-CM | POA: Diagnosis not present

## 2019-12-13 DIAGNOSIS — I10 Essential (primary) hypertension: Secondary | ICD-10-CM | POA: Diagnosis not present

## 2019-12-13 DIAGNOSIS — F329 Major depressive disorder, single episode, unspecified: Secondary | ICD-10-CM | POA: Diagnosis not present

## 2019-12-13 DIAGNOSIS — L89622 Pressure ulcer of left heel, stage 2: Secondary | ICD-10-CM | POA: Diagnosis not present

## 2019-12-13 DIAGNOSIS — E669 Obesity, unspecified: Secondary | ICD-10-CM | POA: Diagnosis not present

## 2019-12-13 DIAGNOSIS — G4733 Obstructive sleep apnea (adult) (pediatric): Secondary | ICD-10-CM | POA: Diagnosis not present

## 2019-12-14 DIAGNOSIS — I10 Essential (primary) hypertension: Secondary | ICD-10-CM | POA: Diagnosis not present

## 2019-12-14 DIAGNOSIS — E669 Obesity, unspecified: Secondary | ICD-10-CM | POA: Diagnosis not present

## 2019-12-14 DIAGNOSIS — G4733 Obstructive sleep apnea (adult) (pediatric): Secondary | ICD-10-CM | POA: Diagnosis not present

## 2019-12-14 DIAGNOSIS — M159 Polyosteoarthritis, unspecified: Secondary | ICD-10-CM | POA: Diagnosis not present

## 2019-12-14 DIAGNOSIS — L89622 Pressure ulcer of left heel, stage 2: Secondary | ICD-10-CM | POA: Diagnosis not present

## 2019-12-14 DIAGNOSIS — F329 Major depressive disorder, single episode, unspecified: Secondary | ICD-10-CM | POA: Diagnosis not present

## 2019-12-17 DIAGNOSIS — I10 Essential (primary) hypertension: Secondary | ICD-10-CM | POA: Diagnosis not present

## 2019-12-17 DIAGNOSIS — G4733 Obstructive sleep apnea (adult) (pediatric): Secondary | ICD-10-CM | POA: Diagnosis not present

## 2019-12-17 DIAGNOSIS — E669 Obesity, unspecified: Secondary | ICD-10-CM | POA: Diagnosis not present

## 2019-12-17 DIAGNOSIS — M159 Polyosteoarthritis, unspecified: Secondary | ICD-10-CM | POA: Diagnosis not present

## 2019-12-17 DIAGNOSIS — F329 Major depressive disorder, single episode, unspecified: Secondary | ICD-10-CM | POA: Diagnosis not present

## 2019-12-17 DIAGNOSIS — L89622 Pressure ulcer of left heel, stage 2: Secondary | ICD-10-CM | POA: Diagnosis not present

## 2019-12-18 DIAGNOSIS — E669 Obesity, unspecified: Secondary | ICD-10-CM | POA: Diagnosis not present

## 2019-12-18 DIAGNOSIS — G4733 Obstructive sleep apnea (adult) (pediatric): Secondary | ICD-10-CM | POA: Diagnosis not present

## 2019-12-18 DIAGNOSIS — M159 Polyosteoarthritis, unspecified: Secondary | ICD-10-CM | POA: Diagnosis not present

## 2019-12-18 DIAGNOSIS — I10 Essential (primary) hypertension: Secondary | ICD-10-CM | POA: Diagnosis not present

## 2019-12-18 DIAGNOSIS — L89622 Pressure ulcer of left heel, stage 2: Secondary | ICD-10-CM | POA: Diagnosis not present

## 2019-12-18 DIAGNOSIS — F329 Major depressive disorder, single episode, unspecified: Secondary | ICD-10-CM | POA: Diagnosis not present

## 2019-12-19 DIAGNOSIS — M159 Polyosteoarthritis, unspecified: Secondary | ICD-10-CM | POA: Diagnosis not present

## 2019-12-19 DIAGNOSIS — I10 Essential (primary) hypertension: Secondary | ICD-10-CM | POA: Diagnosis not present

## 2019-12-19 DIAGNOSIS — F329 Major depressive disorder, single episode, unspecified: Secondary | ICD-10-CM | POA: Diagnosis not present

## 2019-12-19 DIAGNOSIS — G4733 Obstructive sleep apnea (adult) (pediatric): Secondary | ICD-10-CM | POA: Diagnosis not present

## 2019-12-19 DIAGNOSIS — Z9181 History of falling: Secondary | ICD-10-CM | POA: Diagnosis not present

## 2019-12-19 DIAGNOSIS — L89622 Pressure ulcer of left heel, stage 2: Secondary | ICD-10-CM | POA: Diagnosis not present

## 2019-12-19 DIAGNOSIS — E785 Hyperlipidemia, unspecified: Secondary | ICD-10-CM | POA: Diagnosis not present

## 2019-12-19 DIAGNOSIS — E669 Obesity, unspecified: Secondary | ICD-10-CM | POA: Diagnosis not present

## 2019-12-20 DIAGNOSIS — G4733 Obstructive sleep apnea (adult) (pediatric): Secondary | ICD-10-CM | POA: Diagnosis not present

## 2019-12-20 DIAGNOSIS — M159 Polyosteoarthritis, unspecified: Secondary | ICD-10-CM | POA: Diagnosis not present

## 2019-12-20 DIAGNOSIS — F329 Major depressive disorder, single episode, unspecified: Secondary | ICD-10-CM | POA: Diagnosis not present

## 2019-12-20 DIAGNOSIS — L89622 Pressure ulcer of left heel, stage 2: Secondary | ICD-10-CM | POA: Diagnosis not present

## 2019-12-20 DIAGNOSIS — I10 Essential (primary) hypertension: Secondary | ICD-10-CM | POA: Diagnosis not present

## 2019-12-20 DIAGNOSIS — E669 Obesity, unspecified: Secondary | ICD-10-CM | POA: Diagnosis not present

## 2019-12-21 DIAGNOSIS — M159 Polyosteoarthritis, unspecified: Secondary | ICD-10-CM | POA: Diagnosis not present

## 2019-12-21 DIAGNOSIS — F329 Major depressive disorder, single episode, unspecified: Secondary | ICD-10-CM | POA: Diagnosis not present

## 2019-12-21 DIAGNOSIS — G4733 Obstructive sleep apnea (adult) (pediatric): Secondary | ICD-10-CM | POA: Diagnosis not present

## 2019-12-21 DIAGNOSIS — I10 Essential (primary) hypertension: Secondary | ICD-10-CM | POA: Diagnosis not present

## 2019-12-21 DIAGNOSIS — L89622 Pressure ulcer of left heel, stage 2: Secondary | ICD-10-CM | POA: Diagnosis not present

## 2019-12-21 DIAGNOSIS — E669 Obesity, unspecified: Secondary | ICD-10-CM | POA: Diagnosis not present

## 2019-12-24 DIAGNOSIS — K5900 Constipation, unspecified: Secondary | ICD-10-CM | POA: Diagnosis not present

## 2019-12-24 DIAGNOSIS — M159 Polyosteoarthritis, unspecified: Secondary | ICD-10-CM | POA: Diagnosis not present

## 2019-12-24 DIAGNOSIS — I10 Essential (primary) hypertension: Secondary | ICD-10-CM | POA: Diagnosis not present

## 2019-12-24 DIAGNOSIS — F329 Major depressive disorder, single episode, unspecified: Secondary | ICD-10-CM | POA: Diagnosis not present

## 2019-12-26 DIAGNOSIS — F329 Major depressive disorder, single episode, unspecified: Secondary | ICD-10-CM | POA: Diagnosis not present

## 2019-12-26 DIAGNOSIS — I10 Essential (primary) hypertension: Secondary | ICD-10-CM | POA: Diagnosis not present

## 2019-12-26 DIAGNOSIS — L89622 Pressure ulcer of left heel, stage 2: Secondary | ICD-10-CM | POA: Diagnosis not present

## 2019-12-26 DIAGNOSIS — E669 Obesity, unspecified: Secondary | ICD-10-CM | POA: Diagnosis not present

## 2019-12-26 DIAGNOSIS — G4733 Obstructive sleep apnea (adult) (pediatric): Secondary | ICD-10-CM | POA: Diagnosis not present

## 2019-12-26 DIAGNOSIS — M159 Polyosteoarthritis, unspecified: Secondary | ICD-10-CM | POA: Diagnosis not present

## 2019-12-27 DIAGNOSIS — F329 Major depressive disorder, single episode, unspecified: Secondary | ICD-10-CM | POA: Diagnosis not present

## 2019-12-27 DIAGNOSIS — I10 Essential (primary) hypertension: Secondary | ICD-10-CM | POA: Diagnosis not present

## 2019-12-27 DIAGNOSIS — G4733 Obstructive sleep apnea (adult) (pediatric): Secondary | ICD-10-CM | POA: Diagnosis not present

## 2019-12-27 DIAGNOSIS — E669 Obesity, unspecified: Secondary | ICD-10-CM | POA: Diagnosis not present

## 2019-12-27 DIAGNOSIS — M159 Polyosteoarthritis, unspecified: Secondary | ICD-10-CM | POA: Diagnosis not present

## 2019-12-27 DIAGNOSIS — L89622 Pressure ulcer of left heel, stage 2: Secondary | ICD-10-CM | POA: Diagnosis not present

## 2020-01-02 DIAGNOSIS — E669 Obesity, unspecified: Secondary | ICD-10-CM | POA: Diagnosis not present

## 2020-01-02 DIAGNOSIS — I10 Essential (primary) hypertension: Secondary | ICD-10-CM | POA: Diagnosis not present

## 2020-01-02 DIAGNOSIS — F329 Major depressive disorder, single episode, unspecified: Secondary | ICD-10-CM | POA: Diagnosis not present

## 2020-01-02 DIAGNOSIS — G4733 Obstructive sleep apnea (adult) (pediatric): Secondary | ICD-10-CM | POA: Diagnosis not present

## 2020-01-02 DIAGNOSIS — L89622 Pressure ulcer of left heel, stage 2: Secondary | ICD-10-CM | POA: Diagnosis not present

## 2020-01-02 DIAGNOSIS — M159 Polyosteoarthritis, unspecified: Secondary | ICD-10-CM | POA: Diagnosis not present

## 2020-01-03 DIAGNOSIS — G4733 Obstructive sleep apnea (adult) (pediatric): Secondary | ICD-10-CM | POA: Diagnosis not present

## 2020-01-03 DIAGNOSIS — E669 Obesity, unspecified: Secondary | ICD-10-CM | POA: Diagnosis not present

## 2020-01-03 DIAGNOSIS — I10 Essential (primary) hypertension: Secondary | ICD-10-CM | POA: Diagnosis not present

## 2020-01-03 DIAGNOSIS — F329 Major depressive disorder, single episode, unspecified: Secondary | ICD-10-CM | POA: Diagnosis not present

## 2020-01-03 DIAGNOSIS — L89622 Pressure ulcer of left heel, stage 2: Secondary | ICD-10-CM | POA: Diagnosis not present

## 2020-01-03 DIAGNOSIS — M159 Polyosteoarthritis, unspecified: Secondary | ICD-10-CM | POA: Diagnosis not present

## 2020-01-08 DIAGNOSIS — E669 Obesity, unspecified: Secondary | ICD-10-CM | POA: Diagnosis not present

## 2020-01-08 DIAGNOSIS — I10 Essential (primary) hypertension: Secondary | ICD-10-CM | POA: Diagnosis not present

## 2020-01-08 DIAGNOSIS — G4733 Obstructive sleep apnea (adult) (pediatric): Secondary | ICD-10-CM | POA: Diagnosis not present

## 2020-01-08 DIAGNOSIS — L89622 Pressure ulcer of left heel, stage 2: Secondary | ICD-10-CM | POA: Diagnosis not present

## 2020-01-08 DIAGNOSIS — F329 Major depressive disorder, single episode, unspecified: Secondary | ICD-10-CM | POA: Diagnosis not present

## 2020-01-08 DIAGNOSIS — M159 Polyosteoarthritis, unspecified: Secondary | ICD-10-CM | POA: Diagnosis not present

## 2020-01-10 DIAGNOSIS — L89622 Pressure ulcer of left heel, stage 2: Secondary | ICD-10-CM | POA: Diagnosis not present

## 2020-01-10 DIAGNOSIS — E669 Obesity, unspecified: Secondary | ICD-10-CM | POA: Diagnosis not present

## 2020-01-10 DIAGNOSIS — I10 Essential (primary) hypertension: Secondary | ICD-10-CM | POA: Diagnosis not present

## 2020-01-10 DIAGNOSIS — M159 Polyosteoarthritis, unspecified: Secondary | ICD-10-CM | POA: Diagnosis not present

## 2020-01-10 DIAGNOSIS — G4733 Obstructive sleep apnea (adult) (pediatric): Secondary | ICD-10-CM | POA: Diagnosis not present

## 2020-01-10 DIAGNOSIS — F329 Major depressive disorder, single episode, unspecified: Secondary | ICD-10-CM | POA: Diagnosis not present

## 2020-01-12 DIAGNOSIS — I10 Essential (primary) hypertension: Secondary | ICD-10-CM | POA: Diagnosis not present

## 2020-01-12 DIAGNOSIS — M159 Polyosteoarthritis, unspecified: Secondary | ICD-10-CM | POA: Diagnosis not present

## 2020-01-12 DIAGNOSIS — E669 Obesity, unspecified: Secondary | ICD-10-CM | POA: Diagnosis not present

## 2020-01-12 DIAGNOSIS — G4733 Obstructive sleep apnea (adult) (pediatric): Secondary | ICD-10-CM | POA: Diagnosis not present

## 2020-01-12 DIAGNOSIS — F329 Major depressive disorder, single episode, unspecified: Secondary | ICD-10-CM | POA: Diagnosis not present

## 2020-01-12 DIAGNOSIS — L89622 Pressure ulcer of left heel, stage 2: Secondary | ICD-10-CM | POA: Diagnosis not present

## 2020-01-14 DIAGNOSIS — G4733 Obstructive sleep apnea (adult) (pediatric): Secondary | ICD-10-CM | POA: Diagnosis not present

## 2020-01-14 DIAGNOSIS — F329 Major depressive disorder, single episode, unspecified: Secondary | ICD-10-CM | POA: Diagnosis not present

## 2020-01-14 DIAGNOSIS — M159 Polyosteoarthritis, unspecified: Secondary | ICD-10-CM | POA: Diagnosis not present

## 2020-01-14 DIAGNOSIS — L89622 Pressure ulcer of left heel, stage 2: Secondary | ICD-10-CM | POA: Diagnosis not present

## 2020-01-14 DIAGNOSIS — E669 Obesity, unspecified: Secondary | ICD-10-CM | POA: Diagnosis not present

## 2020-01-14 DIAGNOSIS — I10 Essential (primary) hypertension: Secondary | ICD-10-CM | POA: Diagnosis not present

## 2020-01-15 DIAGNOSIS — H353132 Nonexudative age-related macular degeneration, bilateral, intermediate dry stage: Secondary | ICD-10-CM | POA: Diagnosis not present

## 2020-01-15 DIAGNOSIS — H52223 Regular astigmatism, bilateral: Secondary | ICD-10-CM | POA: Diagnosis not present

## 2020-01-15 DIAGNOSIS — H524 Presbyopia: Secondary | ICD-10-CM | POA: Diagnosis not present

## 2020-01-15 DIAGNOSIS — H5201 Hypermetropia, right eye: Secondary | ICD-10-CM | POA: Diagnosis not present

## 2020-01-16 DIAGNOSIS — L89622 Pressure ulcer of left heel, stage 2: Secondary | ICD-10-CM | POA: Diagnosis not present

## 2020-01-16 DIAGNOSIS — K5900 Constipation, unspecified: Secondary | ICD-10-CM | POA: Diagnosis not present

## 2020-01-16 DIAGNOSIS — E669 Obesity, unspecified: Secondary | ICD-10-CM | POA: Diagnosis not present

## 2020-01-16 DIAGNOSIS — G4733 Obstructive sleep apnea (adult) (pediatric): Secondary | ICD-10-CM | POA: Diagnosis not present

## 2020-01-16 DIAGNOSIS — F329 Major depressive disorder, single episode, unspecified: Secondary | ICD-10-CM | POA: Diagnosis not present

## 2020-01-16 DIAGNOSIS — M159 Polyosteoarthritis, unspecified: Secondary | ICD-10-CM | POA: Diagnosis not present

## 2020-01-16 DIAGNOSIS — I10 Essential (primary) hypertension: Secondary | ICD-10-CM | POA: Diagnosis not present

## 2020-01-17 DIAGNOSIS — L89622 Pressure ulcer of left heel, stage 2: Secondary | ICD-10-CM | POA: Diagnosis not present

## 2020-01-17 DIAGNOSIS — I10 Essential (primary) hypertension: Secondary | ICD-10-CM | POA: Diagnosis not present

## 2020-01-17 DIAGNOSIS — G4733 Obstructive sleep apnea (adult) (pediatric): Secondary | ICD-10-CM | POA: Diagnosis not present

## 2020-01-17 DIAGNOSIS — F329 Major depressive disorder, single episode, unspecified: Secondary | ICD-10-CM | POA: Diagnosis not present

## 2020-01-17 DIAGNOSIS — E669 Obesity, unspecified: Secondary | ICD-10-CM | POA: Diagnosis not present

## 2020-01-17 DIAGNOSIS — M159 Polyosteoarthritis, unspecified: Secondary | ICD-10-CM | POA: Diagnosis not present

## 2020-01-18 DIAGNOSIS — M159 Polyosteoarthritis, unspecified: Secondary | ICD-10-CM | POA: Diagnosis not present

## 2020-01-18 DIAGNOSIS — E785 Hyperlipidemia, unspecified: Secondary | ICD-10-CM | POA: Diagnosis not present

## 2020-01-18 DIAGNOSIS — L89622 Pressure ulcer of left heel, stage 2: Secondary | ICD-10-CM | POA: Diagnosis not present

## 2020-01-18 DIAGNOSIS — G4733 Obstructive sleep apnea (adult) (pediatric): Secondary | ICD-10-CM | POA: Diagnosis not present

## 2020-01-18 DIAGNOSIS — Z9181 History of falling: Secondary | ICD-10-CM | POA: Diagnosis not present

## 2020-01-18 DIAGNOSIS — F329 Major depressive disorder, single episode, unspecified: Secondary | ICD-10-CM | POA: Diagnosis not present

## 2020-01-18 DIAGNOSIS — I10 Essential (primary) hypertension: Secondary | ICD-10-CM | POA: Diagnosis not present

## 2020-01-18 DIAGNOSIS — E669 Obesity, unspecified: Secondary | ICD-10-CM | POA: Diagnosis not present

## 2020-01-19 DIAGNOSIS — I10 Essential (primary) hypertension: Secondary | ICD-10-CM | POA: Diagnosis not present

## 2020-01-19 DIAGNOSIS — M159 Polyosteoarthritis, unspecified: Secondary | ICD-10-CM | POA: Diagnosis not present

## 2020-01-19 DIAGNOSIS — E785 Hyperlipidemia, unspecified: Secondary | ICD-10-CM | POA: Diagnosis not present

## 2020-02-15 DIAGNOSIS — I10 Essential (primary) hypertension: Secondary | ICD-10-CM | POA: Diagnosis not present

## 2020-02-15 DIAGNOSIS — F329 Major depressive disorder, single episode, unspecified: Secondary | ICD-10-CM | POA: Diagnosis not present

## 2020-02-15 DIAGNOSIS — M159 Polyosteoarthritis, unspecified: Secondary | ICD-10-CM | POA: Diagnosis not present

## 2020-02-15 DIAGNOSIS — K5900 Constipation, unspecified: Secondary | ICD-10-CM | POA: Diagnosis not present

## 2020-02-17 DIAGNOSIS — E785 Hyperlipidemia, unspecified: Secondary | ICD-10-CM | POA: Diagnosis not present

## 2020-02-17 DIAGNOSIS — I10 Essential (primary) hypertension: Secondary | ICD-10-CM | POA: Diagnosis not present

## 2020-02-17 DIAGNOSIS — Z9181 History of falling: Secondary | ICD-10-CM | POA: Diagnosis not present

## 2020-02-17 DIAGNOSIS — M159 Polyosteoarthritis, unspecified: Secondary | ICD-10-CM | POA: Diagnosis not present

## 2020-02-17 DIAGNOSIS — F329 Major depressive disorder, single episode, unspecified: Secondary | ICD-10-CM | POA: Diagnosis not present

## 2020-02-17 DIAGNOSIS — E669 Obesity, unspecified: Secondary | ICD-10-CM | POA: Diagnosis not present

## 2020-02-17 DIAGNOSIS — G4733 Obstructive sleep apnea (adult) (pediatric): Secondary | ICD-10-CM | POA: Diagnosis not present

## 2020-03-14 DIAGNOSIS — G4700 Insomnia, unspecified: Secondary | ICD-10-CM | POA: Diagnosis not present

## 2020-03-19 DIAGNOSIS — G4700 Insomnia, unspecified: Secondary | ICD-10-CM | POA: Diagnosis not present

## 2020-03-19 DIAGNOSIS — F411 Generalized anxiety disorder: Secondary | ICD-10-CM | POA: Diagnosis not present

## 2020-03-19 DIAGNOSIS — F331 Major depressive disorder, recurrent, moderate: Secondary | ICD-10-CM | POA: Diagnosis not present

## 2020-03-21 DIAGNOSIS — M159 Polyosteoarthritis, unspecified: Secondary | ICD-10-CM | POA: Diagnosis not present

## 2020-03-31 DIAGNOSIS — I70203 Unspecified atherosclerosis of native arteries of extremities, bilateral legs: Secondary | ICD-10-CM | POA: Diagnosis not present

## 2020-03-31 DIAGNOSIS — L03032 Cellulitis of left toe: Secondary | ICD-10-CM | POA: Diagnosis not present

## 2020-03-31 DIAGNOSIS — L600 Ingrowing nail: Secondary | ICD-10-CM | POA: Diagnosis not present

## 2020-03-31 DIAGNOSIS — S90212A Contusion of left great toe with damage to nail, initial encounter: Secondary | ICD-10-CM | POA: Diagnosis not present

## 2020-04-02 DIAGNOSIS — K5900 Constipation, unspecified: Secondary | ICD-10-CM | POA: Diagnosis not present

## 2020-04-02 DIAGNOSIS — M159 Polyosteoarthritis, unspecified: Secondary | ICD-10-CM | POA: Diagnosis not present

## 2020-04-02 DIAGNOSIS — F329 Major depressive disorder, single episode, unspecified: Secondary | ICD-10-CM | POA: Diagnosis not present

## 2020-04-14 DIAGNOSIS — G4700 Insomnia, unspecified: Secondary | ICD-10-CM | POA: Diagnosis not present

## 2020-04-14 DIAGNOSIS — F411 Generalized anxiety disorder: Secondary | ICD-10-CM | POA: Diagnosis not present

## 2020-04-14 DIAGNOSIS — F331 Major depressive disorder, recurrent, moderate: Secondary | ICD-10-CM | POA: Diagnosis not present

## 2020-04-18 DIAGNOSIS — M159 Polyosteoarthritis, unspecified: Secondary | ICD-10-CM | POA: Diagnosis not present

## 2020-05-05 DIAGNOSIS — F329 Major depressive disorder, single episode, unspecified: Secondary | ICD-10-CM | POA: Diagnosis not present

## 2020-05-05 DIAGNOSIS — G4700 Insomnia, unspecified: Secondary | ICD-10-CM | POA: Diagnosis not present

## 2020-05-05 DIAGNOSIS — M159 Polyosteoarthritis, unspecified: Secondary | ICD-10-CM | POA: Diagnosis not present

## 2020-05-05 DIAGNOSIS — I10 Essential (primary) hypertension: Secondary | ICD-10-CM | POA: Diagnosis not present

## 2020-05-09 DIAGNOSIS — Z79899 Other long term (current) drug therapy: Secondary | ICD-10-CM | POA: Diagnosis not present

## 2020-05-20 DIAGNOSIS — F331 Major depressive disorder, recurrent, moderate: Secondary | ICD-10-CM | POA: Diagnosis not present

## 2020-05-20 DIAGNOSIS — G4700 Insomnia, unspecified: Secondary | ICD-10-CM | POA: Diagnosis not present

## 2020-05-20 DIAGNOSIS — F411 Generalized anxiety disorder: Secondary | ICD-10-CM | POA: Diagnosis not present

## 2020-05-27 DIAGNOSIS — F331 Major depressive disorder, recurrent, moderate: Secondary | ICD-10-CM | POA: Diagnosis not present

## 2020-05-27 DIAGNOSIS — F411 Generalized anxiety disorder: Secondary | ICD-10-CM | POA: Diagnosis not present

## 2020-05-27 DIAGNOSIS — G4700 Insomnia, unspecified: Secondary | ICD-10-CM | POA: Diagnosis not present

## 2020-05-28 DIAGNOSIS — K08409 Partial loss of teeth, unspecified cause, unspecified class: Secondary | ICD-10-CM | POA: Diagnosis not present

## 2020-05-28 DIAGNOSIS — Z23 Encounter for immunization: Secondary | ICD-10-CM | POA: Diagnosis not present

## 2020-05-30 DIAGNOSIS — M159 Polyosteoarthritis, unspecified: Secondary | ICD-10-CM | POA: Diagnosis not present

## 2020-05-30 DIAGNOSIS — M160 Bilateral primary osteoarthritis of hip: Secondary | ICD-10-CM | POA: Diagnosis not present

## 2020-05-30 DIAGNOSIS — M792 Neuralgia and neuritis, unspecified: Secondary | ICD-10-CM | POA: Diagnosis not present

## 2020-05-30 DIAGNOSIS — F329 Major depressive disorder, single episode, unspecified: Secondary | ICD-10-CM | POA: Diagnosis not present

## 2020-05-30 DIAGNOSIS — I10 Essential (primary) hypertension: Secondary | ICD-10-CM | POA: Diagnosis not present

## 2020-05-30 DIAGNOSIS — Z471 Aftercare following joint replacement surgery: Secondary | ICD-10-CM | POA: Diagnosis not present

## 2020-05-30 DIAGNOSIS — Z6833 Body mass index (BMI) 33.0-33.9, adult: Secondary | ICD-10-CM | POA: Diagnosis not present

## 2020-05-30 DIAGNOSIS — M170 Bilateral primary osteoarthritis of knee: Secondary | ICD-10-CM | POA: Diagnosis not present

## 2020-05-30 DIAGNOSIS — E669 Obesity, unspecified: Secondary | ICD-10-CM | POA: Diagnosis not present

## 2020-05-30 DIAGNOSIS — E785 Hyperlipidemia, unspecified: Secondary | ICD-10-CM | POA: Diagnosis not present

## 2020-06-03 DIAGNOSIS — M160 Bilateral primary osteoarthritis of hip: Secondary | ICD-10-CM | POA: Diagnosis not present

## 2020-06-03 DIAGNOSIS — M792 Neuralgia and neuritis, unspecified: Secondary | ICD-10-CM | POA: Diagnosis not present

## 2020-06-03 DIAGNOSIS — E785 Hyperlipidemia, unspecified: Secondary | ICD-10-CM | POA: Diagnosis not present

## 2020-06-03 DIAGNOSIS — F329 Major depressive disorder, single episode, unspecified: Secondary | ICD-10-CM | POA: Diagnosis not present

## 2020-06-03 DIAGNOSIS — I10 Essential (primary) hypertension: Secondary | ICD-10-CM | POA: Diagnosis not present

## 2020-06-03 DIAGNOSIS — M170 Bilateral primary osteoarthritis of knee: Secondary | ICD-10-CM | POA: Diagnosis not present

## 2020-06-04 DIAGNOSIS — M160 Bilateral primary osteoarthritis of hip: Secondary | ICD-10-CM | POA: Diagnosis not present

## 2020-06-04 DIAGNOSIS — I10 Essential (primary) hypertension: Secondary | ICD-10-CM | POA: Diagnosis not present

## 2020-06-04 DIAGNOSIS — M792 Neuralgia and neuritis, unspecified: Secondary | ICD-10-CM | POA: Diagnosis not present

## 2020-06-04 DIAGNOSIS — F329 Major depressive disorder, single episode, unspecified: Secondary | ICD-10-CM | POA: Diagnosis not present

## 2020-06-04 DIAGNOSIS — E785 Hyperlipidemia, unspecified: Secondary | ICD-10-CM | POA: Diagnosis not present

## 2020-06-04 DIAGNOSIS — M170 Bilateral primary osteoarthritis of knee: Secondary | ICD-10-CM | POA: Diagnosis not present

## 2020-06-06 DIAGNOSIS — E785 Hyperlipidemia, unspecified: Secondary | ICD-10-CM | POA: Diagnosis not present

## 2020-06-06 DIAGNOSIS — M170 Bilateral primary osteoarthritis of knee: Secondary | ICD-10-CM | POA: Diagnosis not present

## 2020-06-06 DIAGNOSIS — M792 Neuralgia and neuritis, unspecified: Secondary | ICD-10-CM | POA: Diagnosis not present

## 2020-06-06 DIAGNOSIS — M160 Bilateral primary osteoarthritis of hip: Secondary | ICD-10-CM | POA: Diagnosis not present

## 2020-06-06 DIAGNOSIS — I10 Essential (primary) hypertension: Secondary | ICD-10-CM | POA: Diagnosis not present

## 2020-06-06 DIAGNOSIS — F329 Major depressive disorder, single episode, unspecified: Secondary | ICD-10-CM | POA: Diagnosis not present

## 2020-06-10 DIAGNOSIS — M792 Neuralgia and neuritis, unspecified: Secondary | ICD-10-CM | POA: Diagnosis not present

## 2020-06-10 DIAGNOSIS — I10 Essential (primary) hypertension: Secondary | ICD-10-CM | POA: Diagnosis not present

## 2020-06-10 DIAGNOSIS — M160 Bilateral primary osteoarthritis of hip: Secondary | ICD-10-CM | POA: Diagnosis not present

## 2020-06-10 DIAGNOSIS — M170 Bilateral primary osteoarthritis of knee: Secondary | ICD-10-CM | POA: Diagnosis not present

## 2020-06-10 DIAGNOSIS — F321 Major depressive disorder, single episode, moderate: Secondary | ICD-10-CM | POA: Diagnosis not present

## 2020-06-10 DIAGNOSIS — E785 Hyperlipidemia, unspecified: Secondary | ICD-10-CM | POA: Diagnosis not present

## 2020-06-10 DIAGNOSIS — F329 Major depressive disorder, single episode, unspecified: Secondary | ICD-10-CM | POA: Diagnosis not present

## 2020-06-11 DIAGNOSIS — F329 Major depressive disorder, single episode, unspecified: Secondary | ICD-10-CM | POA: Diagnosis not present

## 2020-06-11 DIAGNOSIS — M160 Bilateral primary osteoarthritis of hip: Secondary | ICD-10-CM | POA: Diagnosis not present

## 2020-06-11 DIAGNOSIS — I10 Essential (primary) hypertension: Secondary | ICD-10-CM | POA: Diagnosis not present

## 2020-06-11 DIAGNOSIS — M792 Neuralgia and neuritis, unspecified: Secondary | ICD-10-CM | POA: Diagnosis not present

## 2020-06-11 DIAGNOSIS — M170 Bilateral primary osteoarthritis of knee: Secondary | ICD-10-CM | POA: Diagnosis not present

## 2020-06-11 DIAGNOSIS — E785 Hyperlipidemia, unspecified: Secondary | ICD-10-CM | POA: Diagnosis not present

## 2020-06-12 DIAGNOSIS — F321 Major depressive disorder, single episode, moderate: Secondary | ICD-10-CM | POA: Diagnosis not present

## 2020-06-12 DIAGNOSIS — M160 Bilateral primary osteoarthritis of hip: Secondary | ICD-10-CM | POA: Diagnosis not present

## 2020-06-12 DIAGNOSIS — F329 Major depressive disorder, single episode, unspecified: Secondary | ICD-10-CM | POA: Diagnosis not present

## 2020-06-12 DIAGNOSIS — E785 Hyperlipidemia, unspecified: Secondary | ICD-10-CM | POA: Diagnosis not present

## 2020-06-12 DIAGNOSIS — M170 Bilateral primary osteoarthritis of knee: Secondary | ICD-10-CM | POA: Diagnosis not present

## 2020-06-12 DIAGNOSIS — I10 Essential (primary) hypertension: Secondary | ICD-10-CM | POA: Diagnosis not present

## 2020-06-12 DIAGNOSIS — M792 Neuralgia and neuritis, unspecified: Secondary | ICD-10-CM | POA: Diagnosis not present

## 2020-06-16 DIAGNOSIS — M160 Bilateral primary osteoarthritis of hip: Secondary | ICD-10-CM | POA: Diagnosis not present

## 2020-06-16 DIAGNOSIS — M170 Bilateral primary osteoarthritis of knee: Secondary | ICD-10-CM | POA: Diagnosis not present

## 2020-06-16 DIAGNOSIS — I10 Essential (primary) hypertension: Secondary | ICD-10-CM | POA: Diagnosis not present

## 2020-06-16 DIAGNOSIS — M792 Neuralgia and neuritis, unspecified: Secondary | ICD-10-CM | POA: Diagnosis not present

## 2020-06-16 DIAGNOSIS — F329 Major depressive disorder, single episode, unspecified: Secondary | ICD-10-CM | POA: Diagnosis not present

## 2020-06-16 DIAGNOSIS — E785 Hyperlipidemia, unspecified: Secondary | ICD-10-CM | POA: Diagnosis not present

## 2020-06-17 DIAGNOSIS — M160 Bilateral primary osteoarthritis of hip: Secondary | ICD-10-CM | POA: Diagnosis not present

## 2020-06-17 DIAGNOSIS — M792 Neuralgia and neuritis, unspecified: Secondary | ICD-10-CM | POA: Diagnosis not present

## 2020-06-17 DIAGNOSIS — F329 Major depressive disorder, single episode, unspecified: Secondary | ICD-10-CM | POA: Diagnosis not present

## 2020-06-17 DIAGNOSIS — E785 Hyperlipidemia, unspecified: Secondary | ICD-10-CM | POA: Diagnosis not present

## 2020-06-17 DIAGNOSIS — M170 Bilateral primary osteoarthritis of knee: Secondary | ICD-10-CM | POA: Diagnosis not present

## 2020-06-17 DIAGNOSIS — I10 Essential (primary) hypertension: Secondary | ICD-10-CM | POA: Diagnosis not present

## 2020-06-18 DIAGNOSIS — R2689 Other abnormalities of gait and mobility: Secondary | ICD-10-CM | POA: Diagnosis not present

## 2020-06-18 DIAGNOSIS — R296 Repeated falls: Secondary | ICD-10-CM | POA: Diagnosis not present

## 2020-06-19 DIAGNOSIS — E785 Hyperlipidemia, unspecified: Secondary | ICD-10-CM | POA: Diagnosis not present

## 2020-06-19 DIAGNOSIS — F321 Major depressive disorder, single episode, moderate: Secondary | ICD-10-CM | POA: Diagnosis not present

## 2020-06-19 DIAGNOSIS — M792 Neuralgia and neuritis, unspecified: Secondary | ICD-10-CM | POA: Diagnosis not present

## 2020-06-19 DIAGNOSIS — F329 Major depressive disorder, single episode, unspecified: Secondary | ICD-10-CM | POA: Diagnosis not present

## 2020-06-19 DIAGNOSIS — I10 Essential (primary) hypertension: Secondary | ICD-10-CM | POA: Diagnosis not present

## 2020-06-19 DIAGNOSIS — M160 Bilateral primary osteoarthritis of hip: Secondary | ICD-10-CM | POA: Diagnosis not present

## 2020-06-19 DIAGNOSIS — M170 Bilateral primary osteoarthritis of knee: Secondary | ICD-10-CM | POA: Diagnosis not present

## 2020-06-20 DIAGNOSIS — F331 Major depressive disorder, recurrent, moderate: Secondary | ICD-10-CM | POA: Diagnosis not present

## 2020-06-20 DIAGNOSIS — G4700 Insomnia, unspecified: Secondary | ICD-10-CM | POA: Diagnosis not present

## 2020-06-20 DIAGNOSIS — F411 Generalized anxiety disorder: Secondary | ICD-10-CM | POA: Diagnosis not present

## 2020-06-23 DIAGNOSIS — M79674 Pain in right toe(s): Secondary | ICD-10-CM | POA: Diagnosis not present

## 2020-06-23 DIAGNOSIS — I70203 Unspecified atherosclerosis of native arteries of extremities, bilateral legs: Secondary | ICD-10-CM | POA: Diagnosis not present

## 2020-06-23 DIAGNOSIS — L600 Ingrowing nail: Secondary | ICD-10-CM | POA: Diagnosis not present

## 2020-06-23 DIAGNOSIS — L851 Acquired keratosis [keratoderma] palmaris et plantaris: Secondary | ICD-10-CM | POA: Diagnosis not present

## 2020-06-23 DIAGNOSIS — M79675 Pain in left toe(s): Secondary | ICD-10-CM | POA: Diagnosis not present

## 2020-06-24 DIAGNOSIS — Z23 Encounter for immunization: Secondary | ICD-10-CM | POA: Diagnosis not present

## 2020-06-26 DIAGNOSIS — F329 Major depressive disorder, single episode, unspecified: Secondary | ICD-10-CM | POA: Diagnosis not present

## 2020-06-26 DIAGNOSIS — I10 Essential (primary) hypertension: Secondary | ICD-10-CM | POA: Diagnosis not present

## 2020-06-26 DIAGNOSIS — E785 Hyperlipidemia, unspecified: Secondary | ICD-10-CM | POA: Diagnosis not present

## 2020-06-26 DIAGNOSIS — M792 Neuralgia and neuritis, unspecified: Secondary | ICD-10-CM | POA: Diagnosis not present

## 2020-06-26 DIAGNOSIS — M160 Bilateral primary osteoarthritis of hip: Secondary | ICD-10-CM | POA: Diagnosis not present

## 2020-06-26 DIAGNOSIS — M170 Bilateral primary osteoarthritis of knee: Secondary | ICD-10-CM | POA: Diagnosis not present

## 2020-06-26 DIAGNOSIS — F321 Major depressive disorder, single episode, moderate: Secondary | ICD-10-CM | POA: Diagnosis not present

## 2020-06-27 DIAGNOSIS — E785 Hyperlipidemia, unspecified: Secondary | ICD-10-CM | POA: Diagnosis not present

## 2020-06-27 DIAGNOSIS — I10 Essential (primary) hypertension: Secondary | ICD-10-CM | POA: Diagnosis not present

## 2020-06-27 DIAGNOSIS — M170 Bilateral primary osteoarthritis of knee: Secondary | ICD-10-CM | POA: Diagnosis not present

## 2020-06-27 DIAGNOSIS — M160 Bilateral primary osteoarthritis of hip: Secondary | ICD-10-CM | POA: Diagnosis not present

## 2020-06-27 DIAGNOSIS — F329 Major depressive disorder, single episode, unspecified: Secondary | ICD-10-CM | POA: Diagnosis not present

## 2020-06-27 DIAGNOSIS — M792 Neuralgia and neuritis, unspecified: Secondary | ICD-10-CM | POA: Diagnosis not present

## 2020-06-29 DIAGNOSIS — M792 Neuralgia and neuritis, unspecified: Secondary | ICD-10-CM | POA: Diagnosis not present

## 2020-06-29 DIAGNOSIS — M160 Bilateral primary osteoarthritis of hip: Secondary | ICD-10-CM | POA: Diagnosis not present

## 2020-06-29 DIAGNOSIS — M170 Bilateral primary osteoarthritis of knee: Secondary | ICD-10-CM | POA: Diagnosis not present

## 2020-06-29 DIAGNOSIS — Z471 Aftercare following joint replacement surgery: Secondary | ICD-10-CM | POA: Diagnosis not present

## 2020-06-29 DIAGNOSIS — F329 Major depressive disorder, single episode, unspecified: Secondary | ICD-10-CM | POA: Diagnosis not present

## 2020-06-29 DIAGNOSIS — I10 Essential (primary) hypertension: Secondary | ICD-10-CM | POA: Diagnosis not present

## 2020-06-29 DIAGNOSIS — Z6833 Body mass index (BMI) 33.0-33.9, adult: Secondary | ICD-10-CM | POA: Diagnosis not present

## 2020-06-29 DIAGNOSIS — E669 Obesity, unspecified: Secondary | ICD-10-CM | POA: Diagnosis not present

## 2020-06-29 DIAGNOSIS — E785 Hyperlipidemia, unspecified: Secondary | ICD-10-CM | POA: Diagnosis not present

## 2020-07-03 DIAGNOSIS — F329 Major depressive disorder, single episode, unspecified: Secondary | ICD-10-CM | POA: Diagnosis not present

## 2020-07-03 DIAGNOSIS — E785 Hyperlipidemia, unspecified: Secondary | ICD-10-CM | POA: Diagnosis not present

## 2020-07-03 DIAGNOSIS — I10 Essential (primary) hypertension: Secondary | ICD-10-CM | POA: Diagnosis not present

## 2020-07-03 DIAGNOSIS — M170 Bilateral primary osteoarthritis of knee: Secondary | ICD-10-CM | POA: Diagnosis not present

## 2020-07-03 DIAGNOSIS — M160 Bilateral primary osteoarthritis of hip: Secondary | ICD-10-CM | POA: Diagnosis not present

## 2020-07-03 DIAGNOSIS — M792 Neuralgia and neuritis, unspecified: Secondary | ICD-10-CM | POA: Diagnosis not present

## 2020-07-07 DIAGNOSIS — M160 Bilateral primary osteoarthritis of hip: Secondary | ICD-10-CM | POA: Diagnosis not present

## 2020-07-07 DIAGNOSIS — I10 Essential (primary) hypertension: Secondary | ICD-10-CM | POA: Diagnosis not present

## 2020-07-07 DIAGNOSIS — F329 Major depressive disorder, single episode, unspecified: Secondary | ICD-10-CM | POA: Diagnosis not present

## 2020-07-07 DIAGNOSIS — M792 Neuralgia and neuritis, unspecified: Secondary | ICD-10-CM | POA: Diagnosis not present

## 2020-07-07 DIAGNOSIS — E785 Hyperlipidemia, unspecified: Secondary | ICD-10-CM | POA: Diagnosis not present

## 2020-07-07 DIAGNOSIS — M170 Bilateral primary osteoarthritis of knee: Secondary | ICD-10-CM | POA: Diagnosis not present

## 2020-07-15 DIAGNOSIS — M160 Bilateral primary osteoarthritis of hip: Secondary | ICD-10-CM | POA: Diagnosis not present

## 2020-07-15 DIAGNOSIS — M792 Neuralgia and neuritis, unspecified: Secondary | ICD-10-CM | POA: Diagnosis not present

## 2020-07-15 DIAGNOSIS — M170 Bilateral primary osteoarthritis of knee: Secondary | ICD-10-CM | POA: Diagnosis not present

## 2020-07-15 DIAGNOSIS — E785 Hyperlipidemia, unspecified: Secondary | ICD-10-CM | POA: Diagnosis not present

## 2020-07-15 DIAGNOSIS — I10 Essential (primary) hypertension: Secondary | ICD-10-CM | POA: Diagnosis not present

## 2020-07-15 DIAGNOSIS — F329 Major depressive disorder, single episode, unspecified: Secondary | ICD-10-CM | POA: Diagnosis not present

## 2020-07-18 DIAGNOSIS — F329 Major depressive disorder, single episode, unspecified: Secondary | ICD-10-CM | POA: Diagnosis not present

## 2020-07-18 DIAGNOSIS — I10 Essential (primary) hypertension: Secondary | ICD-10-CM | POA: Diagnosis not present

## 2020-07-18 DIAGNOSIS — M159 Polyosteoarthritis, unspecified: Secondary | ICD-10-CM | POA: Diagnosis not present

## 2020-07-21 DIAGNOSIS — F331 Major depressive disorder, recurrent, moderate: Secondary | ICD-10-CM | POA: Diagnosis not present

## 2020-07-21 DIAGNOSIS — F411 Generalized anxiety disorder: Secondary | ICD-10-CM | POA: Diagnosis not present

## 2020-07-21 DIAGNOSIS — G4700 Insomnia, unspecified: Secondary | ICD-10-CM | POA: Diagnosis not present

## 2020-07-24 DIAGNOSIS — E785 Hyperlipidemia, unspecified: Secondary | ICD-10-CM | POA: Diagnosis not present

## 2020-07-24 DIAGNOSIS — I10 Essential (primary) hypertension: Secondary | ICD-10-CM | POA: Diagnosis not present

## 2020-07-24 DIAGNOSIS — M792 Neuralgia and neuritis, unspecified: Secondary | ICD-10-CM | POA: Diagnosis not present

## 2020-07-24 DIAGNOSIS — M160 Bilateral primary osteoarthritis of hip: Secondary | ICD-10-CM | POA: Diagnosis not present

## 2020-07-24 DIAGNOSIS — F329 Major depressive disorder, single episode, unspecified: Secondary | ICD-10-CM | POA: Diagnosis not present

## 2020-07-24 DIAGNOSIS — M170 Bilateral primary osteoarthritis of knee: Secondary | ICD-10-CM | POA: Diagnosis not present

## 2020-07-28 DIAGNOSIS — F321 Major depressive disorder, single episode, moderate: Secondary | ICD-10-CM | POA: Diagnosis not present

## 2020-07-29 DIAGNOSIS — M160 Bilateral primary osteoarthritis of hip: Secondary | ICD-10-CM | POA: Diagnosis not present

## 2020-07-29 DIAGNOSIS — M170 Bilateral primary osteoarthritis of knee: Secondary | ICD-10-CM | POA: Diagnosis not present

## 2020-07-29 DIAGNOSIS — Z6833 Body mass index (BMI) 33.0-33.9, adult: Secondary | ICD-10-CM | POA: Diagnosis not present

## 2020-07-29 DIAGNOSIS — F329 Major depressive disorder, single episode, unspecified: Secondary | ICD-10-CM | POA: Diagnosis not present

## 2020-07-29 DIAGNOSIS — I10 Essential (primary) hypertension: Secondary | ICD-10-CM | POA: Diagnosis not present

## 2020-07-29 DIAGNOSIS — M159 Polyosteoarthritis, unspecified: Secondary | ICD-10-CM | POA: Diagnosis not present

## 2020-07-29 DIAGNOSIS — E785 Hyperlipidemia, unspecified: Secondary | ICD-10-CM | POA: Diagnosis not present

## 2020-07-29 DIAGNOSIS — M792 Neuralgia and neuritis, unspecified: Secondary | ICD-10-CM | POA: Diagnosis not present

## 2020-07-29 DIAGNOSIS — E669 Obesity, unspecified: Secondary | ICD-10-CM | POA: Diagnosis not present

## 2020-07-29 DIAGNOSIS — F32A Depression, unspecified: Secondary | ICD-10-CM | POA: Diagnosis not present

## 2020-07-30 DIAGNOSIS — M170 Bilateral primary osteoarthritis of knee: Secondary | ICD-10-CM | POA: Diagnosis not present

## 2020-07-30 DIAGNOSIS — M792 Neuralgia and neuritis, unspecified: Secondary | ICD-10-CM | POA: Diagnosis not present

## 2020-07-30 DIAGNOSIS — E785 Hyperlipidemia, unspecified: Secondary | ICD-10-CM | POA: Diagnosis not present

## 2020-07-30 DIAGNOSIS — F32A Depression, unspecified: Secondary | ICD-10-CM | POA: Diagnosis not present

## 2020-07-30 DIAGNOSIS — M160 Bilateral primary osteoarthritis of hip: Secondary | ICD-10-CM | POA: Diagnosis not present

## 2020-07-30 DIAGNOSIS — I10 Essential (primary) hypertension: Secondary | ICD-10-CM | POA: Diagnosis not present

## 2020-08-05 DIAGNOSIS — I10 Essential (primary) hypertension: Secondary | ICD-10-CM | POA: Diagnosis not present

## 2020-08-05 DIAGNOSIS — E785 Hyperlipidemia, unspecified: Secondary | ICD-10-CM | POA: Diagnosis not present

## 2020-08-05 DIAGNOSIS — M160 Bilateral primary osteoarthritis of hip: Secondary | ICD-10-CM | POA: Diagnosis not present

## 2020-08-05 DIAGNOSIS — M792 Neuralgia and neuritis, unspecified: Secondary | ICD-10-CM | POA: Diagnosis not present

## 2020-08-05 DIAGNOSIS — M170 Bilateral primary osteoarthritis of knee: Secondary | ICD-10-CM | POA: Diagnosis not present

## 2020-08-05 DIAGNOSIS — F32A Depression, unspecified: Secondary | ICD-10-CM | POA: Diagnosis not present

## 2020-08-06 DIAGNOSIS — F321 Major depressive disorder, single episode, moderate: Secondary | ICD-10-CM | POA: Diagnosis not present

## 2020-08-07 DIAGNOSIS — M160 Bilateral primary osteoarthritis of hip: Secondary | ICD-10-CM | POA: Diagnosis not present

## 2020-08-07 DIAGNOSIS — E785 Hyperlipidemia, unspecified: Secondary | ICD-10-CM | POA: Diagnosis not present

## 2020-08-07 DIAGNOSIS — F32A Depression, unspecified: Secondary | ICD-10-CM | POA: Diagnosis not present

## 2020-08-07 DIAGNOSIS — M170 Bilateral primary osteoarthritis of knee: Secondary | ICD-10-CM | POA: Diagnosis not present

## 2020-08-07 DIAGNOSIS — I10 Essential (primary) hypertension: Secondary | ICD-10-CM | POA: Diagnosis not present

## 2020-08-07 DIAGNOSIS — M792 Neuralgia and neuritis, unspecified: Secondary | ICD-10-CM | POA: Diagnosis not present

## 2020-08-12 DIAGNOSIS — I10 Essential (primary) hypertension: Secondary | ICD-10-CM | POA: Diagnosis not present

## 2020-08-12 DIAGNOSIS — E785 Hyperlipidemia, unspecified: Secondary | ICD-10-CM | POA: Diagnosis not present

## 2020-08-12 DIAGNOSIS — F32A Depression, unspecified: Secondary | ICD-10-CM | POA: Diagnosis not present

## 2020-08-12 DIAGNOSIS — M170 Bilateral primary osteoarthritis of knee: Secondary | ICD-10-CM | POA: Diagnosis not present

## 2020-08-12 DIAGNOSIS — M792 Neuralgia and neuritis, unspecified: Secondary | ICD-10-CM | POA: Diagnosis not present

## 2020-08-12 DIAGNOSIS — M160 Bilateral primary osteoarthritis of hip: Secondary | ICD-10-CM | POA: Diagnosis not present

## 2020-08-13 DIAGNOSIS — M170 Bilateral primary osteoarthritis of knee: Secondary | ICD-10-CM | POA: Diagnosis not present

## 2020-08-13 DIAGNOSIS — F321 Major depressive disorder, single episode, moderate: Secondary | ICD-10-CM | POA: Diagnosis not present

## 2020-08-13 DIAGNOSIS — M792 Neuralgia and neuritis, unspecified: Secondary | ICD-10-CM | POA: Diagnosis not present

## 2020-08-13 DIAGNOSIS — E785 Hyperlipidemia, unspecified: Secondary | ICD-10-CM | POA: Diagnosis not present

## 2020-08-13 DIAGNOSIS — I10 Essential (primary) hypertension: Secondary | ICD-10-CM | POA: Diagnosis not present

## 2020-08-13 DIAGNOSIS — F32A Depression, unspecified: Secondary | ICD-10-CM | POA: Diagnosis not present

## 2020-08-13 DIAGNOSIS — M160 Bilateral primary osteoarthritis of hip: Secondary | ICD-10-CM | POA: Diagnosis not present

## 2020-08-20 DIAGNOSIS — F32A Depression, unspecified: Secondary | ICD-10-CM | POA: Diagnosis not present

## 2020-08-20 DIAGNOSIS — M792 Neuralgia and neuritis, unspecified: Secondary | ICD-10-CM | POA: Diagnosis not present

## 2020-08-20 DIAGNOSIS — E785 Hyperlipidemia, unspecified: Secondary | ICD-10-CM | POA: Diagnosis not present

## 2020-08-20 DIAGNOSIS — M160 Bilateral primary osteoarthritis of hip: Secondary | ICD-10-CM | POA: Diagnosis not present

## 2020-08-20 DIAGNOSIS — M159 Polyosteoarthritis, unspecified: Secondary | ICD-10-CM | POA: Diagnosis not present

## 2020-08-20 DIAGNOSIS — I10 Essential (primary) hypertension: Secondary | ICD-10-CM | POA: Diagnosis not present

## 2020-08-20 DIAGNOSIS — M170 Bilateral primary osteoarthritis of knee: Secondary | ICD-10-CM | POA: Diagnosis not present

## 2020-08-20 DIAGNOSIS — M5459 Other low back pain: Secondary | ICD-10-CM | POA: Diagnosis not present

## 2020-08-20 DIAGNOSIS — M545 Low back pain, unspecified: Secondary | ICD-10-CM | POA: Diagnosis not present

## 2020-08-21 DIAGNOSIS — S39012A Strain of muscle, fascia and tendon of lower back, initial encounter: Secondary | ICD-10-CM | POA: Diagnosis not present

## 2020-08-21 DIAGNOSIS — Z743 Need for continuous supervision: Secondary | ICD-10-CM | POA: Diagnosis not present

## 2020-08-21 DIAGNOSIS — M549 Dorsalgia, unspecified: Secondary | ICD-10-CM | POA: Diagnosis not present

## 2020-08-21 DIAGNOSIS — W010XXA Fall on same level from slipping, tripping and stumbling without subsequent striking against object, initial encounter: Secondary | ICD-10-CM | POA: Diagnosis not present

## 2020-08-23 DIAGNOSIS — K449 Diaphragmatic hernia without obstruction or gangrene: Secondary | ICD-10-CM | POA: Diagnosis not present

## 2020-08-23 DIAGNOSIS — Z741 Need for assistance with personal care: Secondary | ICD-10-CM | POA: Diagnosis not present

## 2020-08-23 DIAGNOSIS — R339 Retention of urine, unspecified: Secondary | ICD-10-CM | POA: Diagnosis not present

## 2020-08-23 DIAGNOSIS — E119 Type 2 diabetes mellitus without complications: Secondary | ICD-10-CM | POA: Diagnosis present

## 2020-08-23 DIAGNOSIS — M549 Dorsalgia, unspecified: Secondary | ICD-10-CM | POA: Diagnosis not present

## 2020-08-23 DIAGNOSIS — M6281 Muscle weakness (generalized): Secondary | ICD-10-CM | POA: Diagnosis not present

## 2020-08-23 DIAGNOSIS — E162 Hypoglycemia, unspecified: Secondary | ICD-10-CM | POA: Diagnosis not present

## 2020-08-23 DIAGNOSIS — R0902 Hypoxemia: Secondary | ICD-10-CM | POA: Diagnosis not present

## 2020-08-23 DIAGNOSIS — Z9049 Acquired absence of other specified parts of digestive tract: Secondary | ICD-10-CM | POA: Diagnosis not present

## 2020-08-23 DIAGNOSIS — F419 Anxiety disorder, unspecified: Secondary | ICD-10-CM | POA: Diagnosis not present

## 2020-08-23 DIAGNOSIS — M4856XA Collapsed vertebra, not elsewhere classified, lumbar region, initial encounter for fracture: Secondary | ICD-10-CM | POA: Diagnosis present

## 2020-08-23 DIAGNOSIS — E785 Hyperlipidemia, unspecified: Secondary | ICD-10-CM | POA: Diagnosis not present

## 2020-08-23 DIAGNOSIS — G834 Cauda equina syndrome: Secondary | ICD-10-CM | POA: Diagnosis not present

## 2020-08-23 DIAGNOSIS — Z0389 Encounter for observation for other suspected diseases and conditions ruled out: Secondary | ICD-10-CM | POA: Diagnosis not present

## 2020-08-23 DIAGNOSIS — Z9981 Dependence on supplemental oxygen: Secondary | ICD-10-CM | POA: Diagnosis not present

## 2020-08-23 DIAGNOSIS — J189 Pneumonia, unspecified organism: Secondary | ICD-10-CM | POA: Diagnosis not present

## 2020-08-23 DIAGNOSIS — N390 Urinary tract infection, site not specified: Secondary | ICD-10-CM | POA: Diagnosis not present

## 2020-08-23 DIAGNOSIS — M5126 Other intervertebral disc displacement, lumbar region: Secondary | ICD-10-CM | POA: Diagnosis present

## 2020-08-23 DIAGNOSIS — R109 Unspecified abdominal pain: Secondary | ICD-10-CM | POA: Diagnosis not present

## 2020-08-23 DIAGNOSIS — Z4789 Encounter for other orthopedic aftercare: Secondary | ICD-10-CM | POA: Diagnosis not present

## 2020-08-23 DIAGNOSIS — M48062 Spinal stenosis, lumbar region with neurogenic claudication: Secondary | ICD-10-CM | POA: Diagnosis not present

## 2020-08-23 DIAGNOSIS — M4807 Spinal stenosis, lumbosacral region: Secondary | ICD-10-CM | POA: Diagnosis not present

## 2020-08-23 DIAGNOSIS — M48061 Spinal stenosis, lumbar region without neurogenic claudication: Secondary | ICD-10-CM | POA: Diagnosis not present

## 2020-08-23 DIAGNOSIS — M47816 Spondylosis without myelopathy or radiculopathy, lumbar region: Secondary | ICD-10-CM | POA: Diagnosis not present

## 2020-08-23 DIAGNOSIS — I959 Hypotension, unspecified: Secondary | ICD-10-CM | POA: Diagnosis not present

## 2020-08-23 DIAGNOSIS — J9811 Atelectasis: Secondary | ICD-10-CM | POA: Diagnosis not present

## 2020-08-23 DIAGNOSIS — G8929 Other chronic pain: Secondary | ICD-10-CM | POA: Diagnosis present

## 2020-08-23 DIAGNOSIS — I10 Essential (primary) hypertension: Secondary | ICD-10-CM | POA: Diagnosis not present

## 2020-08-23 DIAGNOSIS — Z743 Need for continuous supervision: Secondary | ICD-10-CM | POA: Diagnosis not present

## 2020-08-23 DIAGNOSIS — F331 Major depressive disorder, recurrent, moderate: Secondary | ICD-10-CM | POA: Diagnosis not present

## 2020-08-23 DIAGNOSIS — Z20822 Contact with and (suspected) exposure to covid-19: Secondary | ICD-10-CM | POA: Diagnosis present

## 2020-08-23 DIAGNOSIS — S32049A Unspecified fracture of fourth lumbar vertebra, initial encounter for closed fracture: Secondary | ICD-10-CM | POA: Diagnosis not present

## 2020-08-27 DIAGNOSIS — Z6833 Body mass index (BMI) 33.0-33.9, adult: Secondary | ICD-10-CM | POA: Diagnosis not present

## 2020-08-27 DIAGNOSIS — S32040D Wedge compression fracture of fourth lumbar vertebra, subsequent encounter for fracture with routine healing: Secondary | ICD-10-CM | POA: Diagnosis not present

## 2020-08-27 DIAGNOSIS — M170 Bilateral primary osteoarthritis of knee: Secondary | ICD-10-CM | POA: Diagnosis not present

## 2020-08-27 DIAGNOSIS — M792 Neuralgia and neuritis, unspecified: Secondary | ICD-10-CM | POA: Diagnosis not present

## 2020-08-27 DIAGNOSIS — Z981 Arthrodesis status: Secondary | ICD-10-CM | POA: Diagnosis not present

## 2020-08-27 DIAGNOSIS — E669 Obesity, unspecified: Secondary | ICD-10-CM | POA: Diagnosis not present

## 2020-08-27 DIAGNOSIS — E782 Mixed hyperlipidemia: Secondary | ICD-10-CM | POA: Diagnosis not present

## 2020-08-27 DIAGNOSIS — M160 Bilateral primary osteoarthritis of hip: Secondary | ICD-10-CM | POA: Diagnosis not present

## 2020-08-27 DIAGNOSIS — M8008XA Age-related osteoporosis with current pathological fracture, vertebra(e), initial encounter for fracture: Secondary | ICD-10-CM | POA: Diagnosis not present

## 2020-08-27 DIAGNOSIS — S32048A Other fracture of fourth lumbar vertebra, initial encounter for closed fracture: Secondary | ICD-10-CM | POA: Diagnosis not present

## 2020-08-27 DIAGNOSIS — S32049D Unspecified fracture of fourth lumbar vertebra, subsequent encounter for fracture with routine healing: Secondary | ICD-10-CM | POA: Diagnosis not present

## 2020-08-27 DIAGNOSIS — J181 Lobar pneumonia, unspecified organism: Secondary | ICD-10-CM | POA: Diagnosis not present

## 2020-08-27 DIAGNOSIS — S22050A Wedge compression fracture of T5-T6 vertebra, initial encounter for closed fracture: Secondary | ICD-10-CM | POA: Diagnosis not present

## 2020-08-27 DIAGNOSIS — M48061 Spinal stenosis, lumbar region without neurogenic claudication: Secondary | ICD-10-CM | POA: Diagnosis not present

## 2020-08-27 DIAGNOSIS — R5381 Other malaise: Secondary | ICD-10-CM | POA: Diagnosis not present

## 2020-08-27 DIAGNOSIS — G834 Cauda equina syndrome: Secondary | ICD-10-CM | POA: Diagnosis not present

## 2020-08-27 DIAGNOSIS — Z741 Need for assistance with personal care: Secondary | ICD-10-CM | POA: Diagnosis not present

## 2020-08-27 DIAGNOSIS — R339 Retention of urine, unspecified: Secondary | ICD-10-CM | POA: Diagnosis not present

## 2020-08-27 DIAGNOSIS — Z466 Encounter for fitting and adjustment of urinary device: Secondary | ICD-10-CM | POA: Diagnosis not present

## 2020-08-27 DIAGNOSIS — F445 Conversion disorder with seizures or convulsions: Secondary | ICD-10-CM | POA: Diagnosis not present

## 2020-08-27 DIAGNOSIS — N390 Urinary tract infection, site not specified: Secondary | ICD-10-CM | POA: Diagnosis not present

## 2020-08-27 DIAGNOSIS — J189 Pneumonia, unspecified organism: Secondary | ICD-10-CM | POA: Diagnosis not present

## 2020-08-27 DIAGNOSIS — Z9981 Dependence on supplemental oxygen: Secondary | ICD-10-CM | POA: Diagnosis not present

## 2020-08-27 DIAGNOSIS — Z4789 Encounter for other orthopedic aftercare: Secondary | ICD-10-CM | POA: Diagnosis not present

## 2020-08-27 DIAGNOSIS — Z978 Presence of other specified devices: Secondary | ICD-10-CM | POA: Diagnosis not present

## 2020-08-27 DIAGNOSIS — F419 Anxiety disorder, unspecified: Secondary | ICD-10-CM | POA: Diagnosis not present

## 2020-08-27 DIAGNOSIS — M4807 Spinal stenosis, lumbosacral region: Secondary | ICD-10-CM | POA: Diagnosis not present

## 2020-08-27 DIAGNOSIS — E119 Type 2 diabetes mellitus without complications: Secondary | ICD-10-CM | POA: Diagnosis not present

## 2020-08-27 DIAGNOSIS — F331 Major depressive disorder, recurrent, moderate: Secondary | ICD-10-CM | POA: Diagnosis not present

## 2020-08-27 DIAGNOSIS — I10 Essential (primary) hypertension: Secondary | ICD-10-CM | POA: Diagnosis not present

## 2020-08-27 DIAGNOSIS — S2242XA Multiple fractures of ribs, left side, initial encounter for closed fracture: Secondary | ICD-10-CM | POA: Diagnosis not present

## 2020-08-27 DIAGNOSIS — Z8781 Personal history of (healed) traumatic fracture: Secondary | ICD-10-CM | POA: Diagnosis not present

## 2020-08-27 DIAGNOSIS — F32A Depression, unspecified: Secondary | ICD-10-CM | POA: Diagnosis not present

## 2020-08-27 DIAGNOSIS — E785 Hyperlipidemia, unspecified: Secondary | ICD-10-CM | POA: Diagnosis not present

## 2020-08-27 DIAGNOSIS — M8088XA Other osteoporosis with current pathological fracture, vertebra(e), initial encounter for fracture: Secondary | ICD-10-CM | POA: Diagnosis not present

## 2020-08-27 DIAGNOSIS — M6281 Muscle weakness (generalized): Secondary | ICD-10-CM | POA: Diagnosis not present

## 2020-08-27 DIAGNOSIS — M069 Rheumatoid arthritis, unspecified: Secondary | ICD-10-CM | POA: Diagnosis not present

## 2020-08-28 DIAGNOSIS — S32048A Other fracture of fourth lumbar vertebra, initial encounter for closed fracture: Secondary | ICD-10-CM | POA: Diagnosis not present

## 2020-08-28 DIAGNOSIS — E669 Obesity, unspecified: Secondary | ICD-10-CM | POA: Diagnosis not present

## 2020-08-28 DIAGNOSIS — F32A Depression, unspecified: Secondary | ICD-10-CM | POA: Diagnosis not present

## 2020-08-28 DIAGNOSIS — M160 Bilateral primary osteoarthritis of hip: Secondary | ICD-10-CM | POA: Diagnosis not present

## 2020-08-28 DIAGNOSIS — J181 Lobar pneumonia, unspecified organism: Secondary | ICD-10-CM | POA: Diagnosis not present

## 2020-08-28 DIAGNOSIS — E785 Hyperlipidemia, unspecified: Secondary | ICD-10-CM | POA: Diagnosis not present

## 2020-08-28 DIAGNOSIS — I10 Essential (primary) hypertension: Secondary | ICD-10-CM | POA: Diagnosis not present

## 2020-08-28 DIAGNOSIS — F419 Anxiety disorder, unspecified: Secondary | ICD-10-CM | POA: Diagnosis not present

## 2020-08-28 DIAGNOSIS — R339 Retention of urine, unspecified: Secondary | ICD-10-CM | POA: Diagnosis not present

## 2020-08-28 DIAGNOSIS — E119 Type 2 diabetes mellitus without complications: Secondary | ICD-10-CM | POA: Diagnosis not present

## 2020-08-28 DIAGNOSIS — M069 Rheumatoid arthritis, unspecified: Secondary | ICD-10-CM | POA: Diagnosis not present

## 2020-08-28 DIAGNOSIS — S2242XA Multiple fractures of ribs, left side, initial encounter for closed fracture: Secondary | ICD-10-CM | POA: Diagnosis not present

## 2020-08-28 DIAGNOSIS — Z6833 Body mass index (BMI) 33.0-33.9, adult: Secondary | ICD-10-CM | POA: Diagnosis not present

## 2020-08-28 DIAGNOSIS — M792 Neuralgia and neuritis, unspecified: Secondary | ICD-10-CM | POA: Diagnosis not present

## 2020-08-28 DIAGNOSIS — M170 Bilateral primary osteoarthritis of knee: Secondary | ICD-10-CM | POA: Diagnosis not present

## 2020-08-28 DIAGNOSIS — M8008XA Age-related osteoporosis with current pathological fracture, vertebra(e), initial encounter for fracture: Secondary | ICD-10-CM | POA: Diagnosis not present

## 2020-09-01 DIAGNOSIS — F419 Anxiety disorder, unspecified: Secondary | ICD-10-CM | POA: Diagnosis not present

## 2020-09-01 DIAGNOSIS — R5381 Other malaise: Secondary | ICD-10-CM | POA: Diagnosis not present

## 2020-09-01 DIAGNOSIS — Z4789 Encounter for other orthopedic aftercare: Secondary | ICD-10-CM | POA: Diagnosis not present

## 2020-09-01 DIAGNOSIS — S22050A Wedge compression fracture of T5-T6 vertebra, initial encounter for closed fracture: Secondary | ICD-10-CM | POA: Diagnosis not present

## 2020-09-01 DIAGNOSIS — E119 Type 2 diabetes mellitus without complications: Secondary | ICD-10-CM | POA: Diagnosis not present

## 2020-09-01 DIAGNOSIS — E782 Mixed hyperlipidemia: Secondary | ICD-10-CM | POA: Diagnosis not present

## 2020-09-01 DIAGNOSIS — I10 Essential (primary) hypertension: Secondary | ICD-10-CM | POA: Diagnosis not present

## 2020-09-01 DIAGNOSIS — R339 Retention of urine, unspecified: Secondary | ICD-10-CM | POA: Diagnosis not present

## 2020-09-01 DIAGNOSIS — M48061 Spinal stenosis, lumbar region without neurogenic claudication: Secondary | ICD-10-CM | POA: Diagnosis not present

## 2020-09-01 DIAGNOSIS — S2242XA Multiple fractures of ribs, left side, initial encounter for closed fracture: Secondary | ICD-10-CM | POA: Diagnosis not present

## 2020-09-01 DIAGNOSIS — J181 Lobar pneumonia, unspecified organism: Secondary | ICD-10-CM | POA: Diagnosis not present

## 2020-09-01 DIAGNOSIS — M8088XA Other osteoporosis with current pathological fracture, vertebra(e), initial encounter for fracture: Secondary | ICD-10-CM | POA: Diagnosis not present

## 2020-09-03 DIAGNOSIS — F32A Depression, unspecified: Secondary | ICD-10-CM | POA: Diagnosis not present

## 2020-09-03 DIAGNOSIS — E782 Mixed hyperlipidemia: Secondary | ICD-10-CM | POA: Diagnosis not present

## 2020-09-03 DIAGNOSIS — F419 Anxiety disorder, unspecified: Secondary | ICD-10-CM | POA: Diagnosis not present

## 2020-09-03 DIAGNOSIS — G834 Cauda equina syndrome: Secondary | ICD-10-CM | POA: Diagnosis not present

## 2020-09-03 DIAGNOSIS — I10 Essential (primary) hypertension: Secondary | ICD-10-CM | POA: Diagnosis not present

## 2020-09-03 DIAGNOSIS — R5381 Other malaise: Secondary | ICD-10-CM | POA: Diagnosis not present

## 2020-09-03 DIAGNOSIS — M4807 Spinal stenosis, lumbosacral region: Secondary | ICD-10-CM | POA: Diagnosis not present

## 2020-09-03 DIAGNOSIS — M069 Rheumatoid arthritis, unspecified: Secondary | ICD-10-CM | POA: Diagnosis not present

## 2020-09-03 DIAGNOSIS — R339 Retention of urine, unspecified: Secondary | ICD-10-CM | POA: Diagnosis not present

## 2020-09-03 DIAGNOSIS — F445 Conversion disorder with seizures or convulsions: Secondary | ICD-10-CM | POA: Diagnosis not present

## 2020-09-06 DIAGNOSIS — Z978 Presence of other specified devices: Secondary | ICD-10-CM | POA: Diagnosis not present

## 2020-09-06 DIAGNOSIS — J189 Pneumonia, unspecified organism: Secondary | ICD-10-CM | POA: Diagnosis not present

## 2020-09-06 DIAGNOSIS — Z9981 Dependence on supplemental oxygen: Secondary | ICD-10-CM | POA: Diagnosis not present

## 2020-09-06 DIAGNOSIS — M6281 Muscle weakness (generalized): Secondary | ICD-10-CM | POA: Diagnosis not present

## 2020-09-06 DIAGNOSIS — M069 Rheumatoid arthritis, unspecified: Secondary | ICD-10-CM | POA: Diagnosis not present

## 2020-09-06 DIAGNOSIS — M4807 Spinal stenosis, lumbosacral region: Secondary | ICD-10-CM | POA: Diagnosis not present

## 2020-09-06 DIAGNOSIS — R339 Retention of urine, unspecified: Secondary | ICD-10-CM | POA: Diagnosis not present

## 2020-09-06 DIAGNOSIS — E119 Type 2 diabetes mellitus without complications: Secondary | ICD-10-CM | POA: Diagnosis not present

## 2020-09-06 DIAGNOSIS — S2242XA Multiple fractures of ribs, left side, initial encounter for closed fracture: Secondary | ICD-10-CM | POA: Diagnosis not present

## 2020-09-06 DIAGNOSIS — G834 Cauda equina syndrome: Secondary | ICD-10-CM | POA: Diagnosis not present

## 2020-09-06 DIAGNOSIS — J181 Lobar pneumonia, unspecified organism: Secondary | ICD-10-CM | POA: Diagnosis not present

## 2020-09-06 DIAGNOSIS — Z4789 Encounter for other orthopedic aftercare: Secondary | ICD-10-CM | POA: Diagnosis not present

## 2020-09-06 DIAGNOSIS — N390 Urinary tract infection, site not specified: Secondary | ICD-10-CM | POA: Diagnosis not present

## 2020-09-06 DIAGNOSIS — I10 Essential (primary) hypertension: Secondary | ICD-10-CM | POA: Diagnosis not present

## 2020-09-06 DIAGNOSIS — M48061 Spinal stenosis, lumbar region without neurogenic claudication: Secondary | ICD-10-CM | POA: Diagnosis not present

## 2020-09-06 DIAGNOSIS — Z741 Need for assistance with personal care: Secondary | ICD-10-CM | POA: Diagnosis not present

## 2020-09-06 DIAGNOSIS — S32040D Wedge compression fracture of fourth lumbar vertebra, subsequent encounter for fracture with routine healing: Secondary | ICD-10-CM | POA: Diagnosis not present

## 2020-09-06 DIAGNOSIS — E785 Hyperlipidemia, unspecified: Secondary | ICD-10-CM | POA: Diagnosis not present

## 2020-09-06 DIAGNOSIS — F419 Anxiety disorder, unspecified: Secondary | ICD-10-CM | POA: Diagnosis not present

## 2020-09-06 DIAGNOSIS — F331 Major depressive disorder, recurrent, moderate: Secondary | ICD-10-CM | POA: Diagnosis not present

## 2020-09-06 DIAGNOSIS — F32A Depression, unspecified: Secondary | ICD-10-CM | POA: Diagnosis not present

## 2020-09-09 DIAGNOSIS — F32A Depression, unspecified: Secondary | ICD-10-CM | POA: Diagnosis not present

## 2020-09-09 DIAGNOSIS — R339 Retention of urine, unspecified: Secondary | ICD-10-CM | POA: Diagnosis not present

## 2020-09-09 DIAGNOSIS — J181 Lobar pneumonia, unspecified organism: Secondary | ICD-10-CM | POA: Diagnosis not present

## 2020-09-09 DIAGNOSIS — S2242XA Multiple fractures of ribs, left side, initial encounter for closed fracture: Secondary | ICD-10-CM | POA: Diagnosis not present

## 2020-09-09 DIAGNOSIS — E119 Type 2 diabetes mellitus without complications: Secondary | ICD-10-CM | POA: Diagnosis not present

## 2020-09-09 DIAGNOSIS — Z4789 Encounter for other orthopedic aftercare: Secondary | ICD-10-CM | POA: Diagnosis not present

## 2020-09-09 DIAGNOSIS — F419 Anxiety disorder, unspecified: Secondary | ICD-10-CM | POA: Diagnosis not present

## 2020-09-09 DIAGNOSIS — J189 Pneumonia, unspecified organism: Secondary | ICD-10-CM | POA: Diagnosis not present

## 2020-09-09 DIAGNOSIS — M4807 Spinal stenosis, lumbosacral region: Secondary | ICD-10-CM | POA: Diagnosis not present

## 2020-09-09 DIAGNOSIS — M069 Rheumatoid arthritis, unspecified: Secondary | ICD-10-CM | POA: Diagnosis not present

## 2020-09-09 DIAGNOSIS — I10 Essential (primary) hypertension: Secondary | ICD-10-CM | POA: Diagnosis not present

## 2020-09-11 DIAGNOSIS — Z4789 Encounter for other orthopedic aftercare: Secondary | ICD-10-CM | POA: Diagnosis not present

## 2020-09-11 DIAGNOSIS — R339 Retention of urine, unspecified: Secondary | ICD-10-CM | POA: Diagnosis not present

## 2020-09-11 DIAGNOSIS — G834 Cauda equina syndrome: Secondary | ICD-10-CM | POA: Diagnosis not present

## 2020-09-11 DIAGNOSIS — M4807 Spinal stenosis, lumbosacral region: Secondary | ICD-10-CM | POA: Diagnosis not present

## 2020-09-17 DIAGNOSIS — Z978 Presence of other specified devices: Secondary | ICD-10-CM | POA: Diagnosis not present

## 2020-09-17 DIAGNOSIS — S32040D Wedge compression fracture of fourth lumbar vertebra, subsequent encounter for fracture with routine healing: Secondary | ICD-10-CM | POA: Diagnosis not present

## 2020-09-18 DIAGNOSIS — R339 Retention of urine, unspecified: Secondary | ICD-10-CM | POA: Diagnosis not present

## 2020-09-18 DIAGNOSIS — M160 Bilateral primary osteoarthritis of hip: Secondary | ICD-10-CM | POA: Diagnosis not present

## 2020-09-18 DIAGNOSIS — S32049D Unspecified fracture of fourth lumbar vertebra, subsequent encounter for fracture with routine healing: Secondary | ICD-10-CM | POA: Diagnosis not present

## 2020-09-18 DIAGNOSIS — Z466 Encounter for fitting and adjustment of urinary device: Secondary | ICD-10-CM | POA: Diagnosis not present

## 2020-09-18 DIAGNOSIS — M170 Bilateral primary osteoarthritis of knee: Secondary | ICD-10-CM | POA: Diagnosis not present

## 2020-09-18 DIAGNOSIS — M792 Neuralgia and neuritis, unspecified: Secondary | ICD-10-CM | POA: Diagnosis not present

## 2020-09-19 DIAGNOSIS — M160 Bilateral primary osteoarthritis of hip: Secondary | ICD-10-CM | POA: Diagnosis not present

## 2020-09-19 DIAGNOSIS — M792 Neuralgia and neuritis, unspecified: Secondary | ICD-10-CM | POA: Diagnosis not present

## 2020-09-19 DIAGNOSIS — S32049D Unspecified fracture of fourth lumbar vertebra, subsequent encounter for fracture with routine healing: Secondary | ICD-10-CM | POA: Diagnosis not present

## 2020-09-19 DIAGNOSIS — M170 Bilateral primary osteoarthritis of knee: Secondary | ICD-10-CM | POA: Diagnosis not present

## 2020-09-19 DIAGNOSIS — R339 Retention of urine, unspecified: Secondary | ICD-10-CM | POA: Diagnosis not present

## 2020-09-19 DIAGNOSIS — Z466 Encounter for fitting and adjustment of urinary device: Secondary | ICD-10-CM | POA: Diagnosis not present

## 2020-09-22 DIAGNOSIS — S32049D Unspecified fracture of fourth lumbar vertebra, subsequent encounter for fracture with routine healing: Secondary | ICD-10-CM | POA: Diagnosis not present

## 2020-09-22 DIAGNOSIS — M160 Bilateral primary osteoarthritis of hip: Secondary | ICD-10-CM | POA: Diagnosis not present

## 2020-09-22 DIAGNOSIS — Z466 Encounter for fitting and adjustment of urinary device: Secondary | ICD-10-CM | POA: Diagnosis not present

## 2020-09-22 DIAGNOSIS — R339 Retention of urine, unspecified: Secondary | ICD-10-CM | POA: Diagnosis not present

## 2020-09-22 DIAGNOSIS — M792 Neuralgia and neuritis, unspecified: Secondary | ICD-10-CM | POA: Diagnosis not present

## 2020-09-22 DIAGNOSIS — M170 Bilateral primary osteoarthritis of knee: Secondary | ICD-10-CM | POA: Diagnosis not present

## 2020-09-23 DIAGNOSIS — B351 Tinea unguium: Secondary | ICD-10-CM | POA: Diagnosis not present

## 2020-09-23 DIAGNOSIS — I70203 Unspecified atherosclerosis of native arteries of extremities, bilateral legs: Secondary | ICD-10-CM | POA: Diagnosis not present

## 2020-09-23 DIAGNOSIS — L851 Acquired keratosis [keratoderma] palmaris et plantaris: Secondary | ICD-10-CM | POA: Diagnosis not present

## 2020-09-23 DIAGNOSIS — M79674 Pain in right toe(s): Secondary | ICD-10-CM | POA: Diagnosis not present

## 2020-09-23 DIAGNOSIS — M79675 Pain in left toe(s): Secondary | ICD-10-CM | POA: Diagnosis not present

## 2020-09-25 DIAGNOSIS — M170 Bilateral primary osteoarthritis of knee: Secondary | ICD-10-CM | POA: Diagnosis not present

## 2020-09-25 DIAGNOSIS — Z466 Encounter for fitting and adjustment of urinary device: Secondary | ICD-10-CM | POA: Diagnosis not present

## 2020-09-25 DIAGNOSIS — M792 Neuralgia and neuritis, unspecified: Secondary | ICD-10-CM | POA: Diagnosis not present

## 2020-09-25 DIAGNOSIS — S32049D Unspecified fracture of fourth lumbar vertebra, subsequent encounter for fracture with routine healing: Secondary | ICD-10-CM | POA: Diagnosis not present

## 2020-09-25 DIAGNOSIS — R339 Retention of urine, unspecified: Secondary | ICD-10-CM | POA: Diagnosis not present

## 2020-09-25 DIAGNOSIS — M160 Bilateral primary osteoarthritis of hip: Secondary | ICD-10-CM | POA: Diagnosis not present

## 2020-09-26 DIAGNOSIS — M170 Bilateral primary osteoarthritis of knee: Secondary | ICD-10-CM | POA: Diagnosis not present

## 2020-09-26 DIAGNOSIS — S32049D Unspecified fracture of fourth lumbar vertebra, subsequent encounter for fracture with routine healing: Secondary | ICD-10-CM | POA: Diagnosis not present

## 2020-09-26 DIAGNOSIS — M792 Neuralgia and neuritis, unspecified: Secondary | ICD-10-CM | POA: Diagnosis not present

## 2020-09-26 DIAGNOSIS — R339 Retention of urine, unspecified: Secondary | ICD-10-CM | POA: Diagnosis not present

## 2020-09-26 DIAGNOSIS — M160 Bilateral primary osteoarthritis of hip: Secondary | ICD-10-CM | POA: Diagnosis not present

## 2020-09-26 DIAGNOSIS — Z466 Encounter for fitting and adjustment of urinary device: Secondary | ICD-10-CM | POA: Diagnosis not present

## 2020-09-27 DIAGNOSIS — M160 Bilateral primary osteoarthritis of hip: Secondary | ICD-10-CM | POA: Diagnosis not present

## 2020-09-27 DIAGNOSIS — S32049D Unspecified fracture of fourth lumbar vertebra, subsequent encounter for fracture with routine healing: Secondary | ICD-10-CM | POA: Diagnosis not present

## 2020-09-27 DIAGNOSIS — M792 Neuralgia and neuritis, unspecified: Secondary | ICD-10-CM | POA: Diagnosis not present

## 2020-09-27 DIAGNOSIS — R339 Retention of urine, unspecified: Secondary | ICD-10-CM | POA: Diagnosis not present

## 2020-09-27 DIAGNOSIS — Z8781 Personal history of (healed) traumatic fracture: Secondary | ICD-10-CM | POA: Diagnosis not present

## 2020-09-27 DIAGNOSIS — E785 Hyperlipidemia, unspecified: Secondary | ICD-10-CM | POA: Diagnosis not present

## 2020-09-27 DIAGNOSIS — M170 Bilateral primary osteoarthritis of knee: Secondary | ICD-10-CM | POA: Diagnosis not present

## 2020-09-27 DIAGNOSIS — F329 Major depressive disorder, single episode, unspecified: Secondary | ICD-10-CM | POA: Diagnosis not present

## 2020-09-27 DIAGNOSIS — Z6833 Body mass index (BMI) 33.0-33.9, adult: Secondary | ICD-10-CM | POA: Diagnosis not present

## 2020-09-27 DIAGNOSIS — E669 Obesity, unspecified: Secondary | ICD-10-CM | POA: Diagnosis not present

## 2020-09-27 DIAGNOSIS — F32A Depression, unspecified: Secondary | ICD-10-CM | POA: Diagnosis not present

## 2020-09-27 DIAGNOSIS — Z466 Encounter for fitting and adjustment of urinary device: Secondary | ICD-10-CM | POA: Diagnosis not present

## 2020-09-27 DIAGNOSIS — I10 Essential (primary) hypertension: Secondary | ICD-10-CM | POA: Diagnosis not present

## 2020-09-27 DIAGNOSIS — M159 Polyosteoarthritis, unspecified: Secondary | ICD-10-CM | POA: Diagnosis not present

## 2020-09-27 DIAGNOSIS — Z981 Arthrodesis status: Secondary | ICD-10-CM | POA: Diagnosis not present

## 2020-09-29 DIAGNOSIS — M792 Neuralgia and neuritis, unspecified: Secondary | ICD-10-CM | POA: Diagnosis not present

## 2020-09-29 DIAGNOSIS — Z466 Encounter for fitting and adjustment of urinary device: Secondary | ICD-10-CM | POA: Diagnosis not present

## 2020-09-29 DIAGNOSIS — G4700 Insomnia, unspecified: Secondary | ICD-10-CM | POA: Diagnosis not present

## 2020-09-29 DIAGNOSIS — F411 Generalized anxiety disorder: Secondary | ICD-10-CM | POA: Diagnosis not present

## 2020-09-29 DIAGNOSIS — S32049D Unspecified fracture of fourth lumbar vertebra, subsequent encounter for fracture with routine healing: Secondary | ICD-10-CM | POA: Diagnosis not present

## 2020-09-29 DIAGNOSIS — M170 Bilateral primary osteoarthritis of knee: Secondary | ICD-10-CM | POA: Diagnosis not present

## 2020-09-29 DIAGNOSIS — M160 Bilateral primary osteoarthritis of hip: Secondary | ICD-10-CM | POA: Diagnosis not present

## 2020-09-29 DIAGNOSIS — R339 Retention of urine, unspecified: Secondary | ICD-10-CM | POA: Diagnosis not present

## 2020-09-29 DIAGNOSIS — F331 Major depressive disorder, recurrent, moderate: Secondary | ICD-10-CM | POA: Diagnosis not present

## 2020-09-30 DIAGNOSIS — M160 Bilateral primary osteoarthritis of hip: Secondary | ICD-10-CM | POA: Diagnosis not present

## 2020-09-30 DIAGNOSIS — S32049D Unspecified fracture of fourth lumbar vertebra, subsequent encounter for fracture with routine healing: Secondary | ICD-10-CM | POA: Diagnosis not present

## 2020-09-30 DIAGNOSIS — M792 Neuralgia and neuritis, unspecified: Secondary | ICD-10-CM | POA: Diagnosis not present

## 2020-09-30 DIAGNOSIS — Z466 Encounter for fitting and adjustment of urinary device: Secondary | ICD-10-CM | POA: Diagnosis not present

## 2020-09-30 DIAGNOSIS — M170 Bilateral primary osteoarthritis of knee: Secondary | ICD-10-CM | POA: Diagnosis not present

## 2020-09-30 DIAGNOSIS — R339 Retention of urine, unspecified: Secondary | ICD-10-CM | POA: Diagnosis not present

## 2020-10-01 DIAGNOSIS — M170 Bilateral primary osteoarthritis of knee: Secondary | ICD-10-CM | POA: Diagnosis not present

## 2020-10-01 DIAGNOSIS — Z466 Encounter for fitting and adjustment of urinary device: Secondary | ICD-10-CM | POA: Diagnosis not present

## 2020-10-01 DIAGNOSIS — M792 Neuralgia and neuritis, unspecified: Secondary | ICD-10-CM | POA: Diagnosis not present

## 2020-10-01 DIAGNOSIS — M160 Bilateral primary osteoarthritis of hip: Secondary | ICD-10-CM | POA: Diagnosis not present

## 2020-10-01 DIAGNOSIS — S32049D Unspecified fracture of fourth lumbar vertebra, subsequent encounter for fracture with routine healing: Secondary | ICD-10-CM | POA: Diagnosis not present

## 2020-10-01 DIAGNOSIS — R339 Retention of urine, unspecified: Secondary | ICD-10-CM | POA: Diagnosis not present

## 2020-10-03 DIAGNOSIS — M170 Bilateral primary osteoarthritis of knee: Secondary | ICD-10-CM | POA: Diagnosis not present

## 2020-10-03 DIAGNOSIS — S32049D Unspecified fracture of fourth lumbar vertebra, subsequent encounter for fracture with routine healing: Secondary | ICD-10-CM | POA: Diagnosis not present

## 2020-10-03 DIAGNOSIS — R339 Retention of urine, unspecified: Secondary | ICD-10-CM | POA: Diagnosis not present

## 2020-10-03 DIAGNOSIS — M792 Neuralgia and neuritis, unspecified: Secondary | ICD-10-CM | POA: Diagnosis not present

## 2020-10-03 DIAGNOSIS — Z466 Encounter for fitting and adjustment of urinary device: Secondary | ICD-10-CM | POA: Diagnosis not present

## 2020-10-03 DIAGNOSIS — M160 Bilateral primary osteoarthritis of hip: Secondary | ICD-10-CM | POA: Diagnosis not present

## 2020-10-06 DIAGNOSIS — M792 Neuralgia and neuritis, unspecified: Secondary | ICD-10-CM | POA: Diagnosis not present

## 2020-10-06 DIAGNOSIS — R339 Retention of urine, unspecified: Secondary | ICD-10-CM | POA: Diagnosis not present

## 2020-10-06 DIAGNOSIS — M159 Polyosteoarthritis, unspecified: Secondary | ICD-10-CM | POA: Diagnosis not present

## 2020-10-06 DIAGNOSIS — Z466 Encounter for fitting and adjustment of urinary device: Secondary | ICD-10-CM | POA: Diagnosis not present

## 2020-10-06 DIAGNOSIS — M170 Bilateral primary osteoarthritis of knee: Secondary | ICD-10-CM | POA: Diagnosis not present

## 2020-10-06 DIAGNOSIS — K5900 Constipation, unspecified: Secondary | ICD-10-CM | POA: Diagnosis not present

## 2020-10-06 DIAGNOSIS — F329 Major depressive disorder, single episode, unspecified: Secondary | ICD-10-CM | POA: Diagnosis not present

## 2020-10-06 DIAGNOSIS — S32049D Unspecified fracture of fourth lumbar vertebra, subsequent encounter for fracture with routine healing: Secondary | ICD-10-CM | POA: Diagnosis not present

## 2020-10-06 DIAGNOSIS — I10 Essential (primary) hypertension: Secondary | ICD-10-CM | POA: Diagnosis not present

## 2020-10-06 DIAGNOSIS — M160 Bilateral primary osteoarthritis of hip: Secondary | ICD-10-CM | POA: Diagnosis not present

## 2020-10-07 DIAGNOSIS — M792 Neuralgia and neuritis, unspecified: Secondary | ICD-10-CM | POA: Diagnosis not present

## 2020-10-07 DIAGNOSIS — Z466 Encounter for fitting and adjustment of urinary device: Secondary | ICD-10-CM | POA: Diagnosis not present

## 2020-10-07 DIAGNOSIS — R339 Retention of urine, unspecified: Secondary | ICD-10-CM | POA: Diagnosis not present

## 2020-10-07 DIAGNOSIS — M170 Bilateral primary osteoarthritis of knee: Secondary | ICD-10-CM | POA: Diagnosis not present

## 2020-10-07 DIAGNOSIS — M160 Bilateral primary osteoarthritis of hip: Secondary | ICD-10-CM | POA: Diagnosis not present

## 2020-10-07 DIAGNOSIS — S32049D Unspecified fracture of fourth lumbar vertebra, subsequent encounter for fracture with routine healing: Secondary | ICD-10-CM | POA: Diagnosis not present

## 2020-10-08 DIAGNOSIS — M170 Bilateral primary osteoarthritis of knee: Secondary | ICD-10-CM | POA: Diagnosis not present

## 2020-10-08 DIAGNOSIS — M160 Bilateral primary osteoarthritis of hip: Secondary | ICD-10-CM | POA: Diagnosis not present

## 2020-10-08 DIAGNOSIS — Z466 Encounter for fitting and adjustment of urinary device: Secondary | ICD-10-CM | POA: Diagnosis not present

## 2020-10-08 DIAGNOSIS — M792 Neuralgia and neuritis, unspecified: Secondary | ICD-10-CM | POA: Diagnosis not present

## 2020-10-08 DIAGNOSIS — R339 Retention of urine, unspecified: Secondary | ICD-10-CM | POA: Diagnosis not present

## 2020-10-08 DIAGNOSIS — S32049D Unspecified fracture of fourth lumbar vertebra, subsequent encounter for fracture with routine healing: Secondary | ICD-10-CM | POA: Diagnosis not present

## 2020-10-13 DIAGNOSIS — R339 Retention of urine, unspecified: Secondary | ICD-10-CM | POA: Diagnosis not present

## 2020-10-13 DIAGNOSIS — M160 Bilateral primary osteoarthritis of hip: Secondary | ICD-10-CM | POA: Diagnosis not present

## 2020-10-13 DIAGNOSIS — I10 Essential (primary) hypertension: Secondary | ICD-10-CM | POA: Diagnosis not present

## 2020-10-13 DIAGNOSIS — Z466 Encounter for fitting and adjustment of urinary device: Secondary | ICD-10-CM | POA: Diagnosis not present

## 2020-10-13 DIAGNOSIS — M170 Bilateral primary osteoarthritis of knee: Secondary | ICD-10-CM | POA: Diagnosis not present

## 2020-10-13 DIAGNOSIS — M792 Neuralgia and neuritis, unspecified: Secondary | ICD-10-CM | POA: Diagnosis not present

## 2020-10-13 DIAGNOSIS — Z978 Presence of other specified devices: Secondary | ICD-10-CM | POA: Diagnosis not present

## 2020-10-13 DIAGNOSIS — F329 Major depressive disorder, single episode, unspecified: Secondary | ICD-10-CM | POA: Diagnosis not present

## 2020-10-13 DIAGNOSIS — S32040D Wedge compression fracture of fourth lumbar vertebra, subsequent encounter for fracture with routine healing: Secondary | ICD-10-CM | POA: Diagnosis not present

## 2020-10-13 DIAGNOSIS — S32049D Unspecified fracture of fourth lumbar vertebra, subsequent encounter for fracture with routine healing: Secondary | ICD-10-CM | POA: Diagnosis not present

## 2020-10-15 DIAGNOSIS — S32049D Unspecified fracture of fourth lumbar vertebra, subsequent encounter for fracture with routine healing: Secondary | ICD-10-CM | POA: Diagnosis not present

## 2020-10-15 DIAGNOSIS — M160 Bilateral primary osteoarthritis of hip: Secondary | ICD-10-CM | POA: Diagnosis not present

## 2020-10-15 DIAGNOSIS — M170 Bilateral primary osteoarthritis of knee: Secondary | ICD-10-CM | POA: Diagnosis not present

## 2020-10-15 DIAGNOSIS — M792 Neuralgia and neuritis, unspecified: Secondary | ICD-10-CM | POA: Diagnosis not present

## 2020-10-15 DIAGNOSIS — Z466 Encounter for fitting and adjustment of urinary device: Secondary | ICD-10-CM | POA: Diagnosis not present

## 2020-10-15 DIAGNOSIS — R339 Retention of urine, unspecified: Secondary | ICD-10-CM | POA: Diagnosis not present

## 2020-10-16 DIAGNOSIS — Z466 Encounter for fitting and adjustment of urinary device: Secondary | ICD-10-CM | POA: Diagnosis not present

## 2020-10-16 DIAGNOSIS — M160 Bilateral primary osteoarthritis of hip: Secondary | ICD-10-CM | POA: Diagnosis not present

## 2020-10-16 DIAGNOSIS — F321 Major depressive disorder, single episode, moderate: Secondary | ICD-10-CM | POA: Diagnosis not present

## 2020-10-16 DIAGNOSIS — S32049D Unspecified fracture of fourth lumbar vertebra, subsequent encounter for fracture with routine healing: Secondary | ICD-10-CM | POA: Diagnosis not present

## 2020-10-16 DIAGNOSIS — R339 Retention of urine, unspecified: Secondary | ICD-10-CM | POA: Diagnosis not present

## 2020-10-16 DIAGNOSIS — M792 Neuralgia and neuritis, unspecified: Secondary | ICD-10-CM | POA: Diagnosis not present

## 2020-10-16 DIAGNOSIS — M170 Bilateral primary osteoarthritis of knee: Secondary | ICD-10-CM | POA: Diagnosis not present

## 2020-10-20 DIAGNOSIS — R339 Retention of urine, unspecified: Secondary | ICD-10-CM | POA: Diagnosis not present

## 2020-10-20 DIAGNOSIS — Z466 Encounter for fitting and adjustment of urinary device: Secondary | ICD-10-CM | POA: Diagnosis not present

## 2020-10-20 DIAGNOSIS — S32049D Unspecified fracture of fourth lumbar vertebra, subsequent encounter for fracture with routine healing: Secondary | ICD-10-CM | POA: Diagnosis not present

## 2020-10-20 DIAGNOSIS — M792 Neuralgia and neuritis, unspecified: Secondary | ICD-10-CM | POA: Diagnosis not present

## 2020-10-20 DIAGNOSIS — M170 Bilateral primary osteoarthritis of knee: Secondary | ICD-10-CM | POA: Diagnosis not present

## 2020-10-20 DIAGNOSIS — M160 Bilateral primary osteoarthritis of hip: Secondary | ICD-10-CM | POA: Diagnosis not present

## 2020-10-21 DIAGNOSIS — M792 Neuralgia and neuritis, unspecified: Secondary | ICD-10-CM | POA: Diagnosis not present

## 2020-10-21 DIAGNOSIS — S32049D Unspecified fracture of fourth lumbar vertebra, subsequent encounter for fracture with routine healing: Secondary | ICD-10-CM | POA: Diagnosis not present

## 2020-10-21 DIAGNOSIS — R339 Retention of urine, unspecified: Secondary | ICD-10-CM | POA: Diagnosis not present

## 2020-10-21 DIAGNOSIS — M170 Bilateral primary osteoarthritis of knee: Secondary | ICD-10-CM | POA: Diagnosis not present

## 2020-10-21 DIAGNOSIS — M160 Bilateral primary osteoarthritis of hip: Secondary | ICD-10-CM | POA: Diagnosis not present

## 2020-10-21 DIAGNOSIS — Z466 Encounter for fitting and adjustment of urinary device: Secondary | ICD-10-CM | POA: Diagnosis not present

## 2020-10-22 DIAGNOSIS — M160 Bilateral primary osteoarthritis of hip: Secondary | ICD-10-CM | POA: Diagnosis not present

## 2020-10-22 DIAGNOSIS — S32049D Unspecified fracture of fourth lumbar vertebra, subsequent encounter for fracture with routine healing: Secondary | ICD-10-CM | POA: Diagnosis not present

## 2020-10-22 DIAGNOSIS — M792 Neuralgia and neuritis, unspecified: Secondary | ICD-10-CM | POA: Diagnosis not present

## 2020-10-22 DIAGNOSIS — M170 Bilateral primary osteoarthritis of knee: Secondary | ICD-10-CM | POA: Diagnosis not present

## 2020-10-22 DIAGNOSIS — R339 Retention of urine, unspecified: Secondary | ICD-10-CM | POA: Diagnosis not present

## 2020-10-22 DIAGNOSIS — Z466 Encounter for fitting and adjustment of urinary device: Secondary | ICD-10-CM | POA: Diagnosis not present

## 2020-10-23 DIAGNOSIS — M170 Bilateral primary osteoarthritis of knee: Secondary | ICD-10-CM | POA: Diagnosis not present

## 2020-10-23 DIAGNOSIS — Z466 Encounter for fitting and adjustment of urinary device: Secondary | ICD-10-CM | POA: Diagnosis not present

## 2020-10-23 DIAGNOSIS — M160 Bilateral primary osteoarthritis of hip: Secondary | ICD-10-CM | POA: Diagnosis not present

## 2020-10-23 DIAGNOSIS — R339 Retention of urine, unspecified: Secondary | ICD-10-CM | POA: Diagnosis not present

## 2020-10-23 DIAGNOSIS — M792 Neuralgia and neuritis, unspecified: Secondary | ICD-10-CM | POA: Diagnosis not present

## 2020-10-23 DIAGNOSIS — S32049D Unspecified fracture of fourth lumbar vertebra, subsequent encounter for fracture with routine healing: Secondary | ICD-10-CM | POA: Diagnosis not present

## 2020-10-27 DIAGNOSIS — E785 Hyperlipidemia, unspecified: Secondary | ICD-10-CM | POA: Diagnosis not present

## 2020-10-27 DIAGNOSIS — R339 Retention of urine, unspecified: Secondary | ICD-10-CM | POA: Diagnosis not present

## 2020-10-27 DIAGNOSIS — M170 Bilateral primary osteoarthritis of knee: Secondary | ICD-10-CM | POA: Diagnosis not present

## 2020-10-27 DIAGNOSIS — Z6833 Body mass index (BMI) 33.0-33.9, adult: Secondary | ICD-10-CM | POA: Diagnosis not present

## 2020-10-27 DIAGNOSIS — E669 Obesity, unspecified: Secondary | ICD-10-CM | POA: Diagnosis not present

## 2020-10-27 DIAGNOSIS — Z981 Arthrodesis status: Secondary | ICD-10-CM | POA: Diagnosis not present

## 2020-10-27 DIAGNOSIS — I10 Essential (primary) hypertension: Secondary | ICD-10-CM | POA: Diagnosis not present

## 2020-10-27 DIAGNOSIS — Z8781 Personal history of (healed) traumatic fracture: Secondary | ICD-10-CM | POA: Diagnosis not present

## 2020-10-27 DIAGNOSIS — M792 Neuralgia and neuritis, unspecified: Secondary | ICD-10-CM | POA: Diagnosis not present

## 2020-10-27 DIAGNOSIS — F32A Depression, unspecified: Secondary | ICD-10-CM | POA: Diagnosis not present

## 2020-10-27 DIAGNOSIS — M160 Bilateral primary osteoarthritis of hip: Secondary | ICD-10-CM | POA: Diagnosis not present

## 2020-10-27 DIAGNOSIS — S32049D Unspecified fracture of fourth lumbar vertebra, subsequent encounter for fracture with routine healing: Secondary | ICD-10-CM | POA: Diagnosis not present

## 2020-10-27 DIAGNOSIS — Z466 Encounter for fitting and adjustment of urinary device: Secondary | ICD-10-CM | POA: Diagnosis not present

## 2020-10-28 DIAGNOSIS — R339 Retention of urine, unspecified: Secondary | ICD-10-CM | POA: Diagnosis not present

## 2020-10-28 DIAGNOSIS — Z466 Encounter for fitting and adjustment of urinary device: Secondary | ICD-10-CM | POA: Diagnosis not present

## 2020-10-28 DIAGNOSIS — M170 Bilateral primary osteoarthritis of knee: Secondary | ICD-10-CM | POA: Diagnosis not present

## 2020-10-28 DIAGNOSIS — M792 Neuralgia and neuritis, unspecified: Secondary | ICD-10-CM | POA: Diagnosis not present

## 2020-10-28 DIAGNOSIS — M160 Bilateral primary osteoarthritis of hip: Secondary | ICD-10-CM | POA: Diagnosis not present

## 2020-10-28 DIAGNOSIS — S32049D Unspecified fracture of fourth lumbar vertebra, subsequent encounter for fracture with routine healing: Secondary | ICD-10-CM | POA: Diagnosis not present

## 2020-10-31 DIAGNOSIS — N390 Urinary tract infection, site not specified: Secondary | ICD-10-CM | POA: Diagnosis not present

## 2020-10-31 DIAGNOSIS — R339 Retention of urine, unspecified: Secondary | ICD-10-CM | POA: Diagnosis not present

## 2020-11-03 DIAGNOSIS — R339 Retention of urine, unspecified: Secondary | ICD-10-CM | POA: Diagnosis not present

## 2020-11-03 DIAGNOSIS — F411 Generalized anxiety disorder: Secondary | ICD-10-CM | POA: Diagnosis not present

## 2020-11-03 DIAGNOSIS — M170 Bilateral primary osteoarthritis of knee: Secondary | ICD-10-CM | POA: Diagnosis not present

## 2020-11-03 DIAGNOSIS — G4700 Insomnia, unspecified: Secondary | ICD-10-CM | POA: Diagnosis not present

## 2020-11-03 DIAGNOSIS — M792 Neuralgia and neuritis, unspecified: Secondary | ICD-10-CM | POA: Diagnosis not present

## 2020-11-03 DIAGNOSIS — Z466 Encounter for fitting and adjustment of urinary device: Secondary | ICD-10-CM | POA: Diagnosis not present

## 2020-11-03 DIAGNOSIS — F331 Major depressive disorder, recurrent, moderate: Secondary | ICD-10-CM | POA: Diagnosis not present

## 2020-11-03 DIAGNOSIS — M160 Bilateral primary osteoarthritis of hip: Secondary | ICD-10-CM | POA: Diagnosis not present

## 2020-11-03 DIAGNOSIS — S32049D Unspecified fracture of fourth lumbar vertebra, subsequent encounter for fracture with routine healing: Secondary | ICD-10-CM | POA: Diagnosis not present

## 2020-11-04 DIAGNOSIS — M792 Neuralgia and neuritis, unspecified: Secondary | ICD-10-CM | POA: Diagnosis not present

## 2020-11-04 DIAGNOSIS — R339 Retention of urine, unspecified: Secondary | ICD-10-CM | POA: Diagnosis not present

## 2020-11-04 DIAGNOSIS — Z466 Encounter for fitting and adjustment of urinary device: Secondary | ICD-10-CM | POA: Diagnosis not present

## 2020-11-04 DIAGNOSIS — S32049D Unspecified fracture of fourth lumbar vertebra, subsequent encounter for fracture with routine healing: Secondary | ICD-10-CM | POA: Diagnosis not present

## 2020-11-04 DIAGNOSIS — M160 Bilateral primary osteoarthritis of hip: Secondary | ICD-10-CM | POA: Diagnosis not present

## 2020-11-04 DIAGNOSIS — M170 Bilateral primary osteoarthritis of knee: Secondary | ICD-10-CM | POA: Diagnosis not present

## 2020-11-05 DIAGNOSIS — F321 Major depressive disorder, single episode, moderate: Secondary | ICD-10-CM | POA: Diagnosis not present

## 2020-11-11 DIAGNOSIS — M170 Bilateral primary osteoarthritis of knee: Secondary | ICD-10-CM | POA: Diagnosis not present

## 2020-11-11 DIAGNOSIS — R339 Retention of urine, unspecified: Secondary | ICD-10-CM | POA: Diagnosis not present

## 2020-11-11 DIAGNOSIS — M792 Neuralgia and neuritis, unspecified: Secondary | ICD-10-CM | POA: Diagnosis not present

## 2020-11-11 DIAGNOSIS — S32049D Unspecified fracture of fourth lumbar vertebra, subsequent encounter for fracture with routine healing: Secondary | ICD-10-CM | POA: Diagnosis not present

## 2020-11-11 DIAGNOSIS — Z466 Encounter for fitting and adjustment of urinary device: Secondary | ICD-10-CM | POA: Diagnosis not present

## 2020-11-11 DIAGNOSIS — M160 Bilateral primary osteoarthritis of hip: Secondary | ICD-10-CM | POA: Diagnosis not present

## 2020-11-12 DIAGNOSIS — M170 Bilateral primary osteoarthritis of knee: Secondary | ICD-10-CM | POA: Diagnosis not present

## 2020-11-12 DIAGNOSIS — M792 Neuralgia and neuritis, unspecified: Secondary | ICD-10-CM | POA: Diagnosis not present

## 2020-11-12 DIAGNOSIS — Z466 Encounter for fitting and adjustment of urinary device: Secondary | ICD-10-CM | POA: Diagnosis not present

## 2020-11-12 DIAGNOSIS — M160 Bilateral primary osteoarthritis of hip: Secondary | ICD-10-CM | POA: Diagnosis not present

## 2020-11-12 DIAGNOSIS — S32049D Unspecified fracture of fourth lumbar vertebra, subsequent encounter for fracture with routine healing: Secondary | ICD-10-CM | POA: Diagnosis not present

## 2020-11-12 DIAGNOSIS — R339 Retention of urine, unspecified: Secondary | ICD-10-CM | POA: Diagnosis not present

## 2020-11-13 DIAGNOSIS — F321 Major depressive disorder, single episode, moderate: Secondary | ICD-10-CM | POA: Diagnosis not present

## 2020-11-17 DIAGNOSIS — F331 Major depressive disorder, recurrent, moderate: Secondary | ICD-10-CM | POA: Diagnosis not present

## 2020-11-17 DIAGNOSIS — G4700 Insomnia, unspecified: Secondary | ICD-10-CM | POA: Diagnosis not present

## 2020-11-17 DIAGNOSIS — F411 Generalized anxiety disorder: Secondary | ICD-10-CM | POA: Diagnosis not present

## 2020-11-19 DIAGNOSIS — R339 Retention of urine, unspecified: Secondary | ICD-10-CM | POA: Diagnosis not present

## 2020-11-19 DIAGNOSIS — M170 Bilateral primary osteoarthritis of knee: Secondary | ICD-10-CM | POA: Diagnosis not present

## 2020-11-19 DIAGNOSIS — S32049D Unspecified fracture of fourth lumbar vertebra, subsequent encounter for fracture with routine healing: Secondary | ICD-10-CM | POA: Diagnosis not present

## 2020-11-19 DIAGNOSIS — Z466 Encounter for fitting and adjustment of urinary device: Secondary | ICD-10-CM | POA: Diagnosis not present

## 2020-11-19 DIAGNOSIS — M160 Bilateral primary osteoarthritis of hip: Secondary | ICD-10-CM | POA: Diagnosis not present

## 2020-11-19 DIAGNOSIS — M792 Neuralgia and neuritis, unspecified: Secondary | ICD-10-CM | POA: Diagnosis not present

## 2020-11-20 DIAGNOSIS — R339 Retention of urine, unspecified: Secondary | ICD-10-CM | POA: Diagnosis not present

## 2020-11-20 DIAGNOSIS — M170 Bilateral primary osteoarthritis of knee: Secondary | ICD-10-CM | POA: Diagnosis not present

## 2020-11-20 DIAGNOSIS — M160 Bilateral primary osteoarthritis of hip: Secondary | ICD-10-CM | POA: Diagnosis not present

## 2020-11-20 DIAGNOSIS — M792 Neuralgia and neuritis, unspecified: Secondary | ICD-10-CM | POA: Diagnosis not present

## 2020-11-20 DIAGNOSIS — Z466 Encounter for fitting and adjustment of urinary device: Secondary | ICD-10-CM | POA: Diagnosis not present

## 2020-11-20 DIAGNOSIS — S32049D Unspecified fracture of fourth lumbar vertebra, subsequent encounter for fracture with routine healing: Secondary | ICD-10-CM | POA: Diagnosis not present

## 2020-11-21 DIAGNOSIS — Z466 Encounter for fitting and adjustment of urinary device: Secondary | ICD-10-CM | POA: Diagnosis not present

## 2020-11-21 DIAGNOSIS — S32049D Unspecified fracture of fourth lumbar vertebra, subsequent encounter for fracture with routine healing: Secondary | ICD-10-CM | POA: Diagnosis not present

## 2020-11-21 DIAGNOSIS — M170 Bilateral primary osteoarthritis of knee: Secondary | ICD-10-CM | POA: Diagnosis not present

## 2020-11-21 DIAGNOSIS — R339 Retention of urine, unspecified: Secondary | ICD-10-CM | POA: Diagnosis not present

## 2020-11-21 DIAGNOSIS — M792 Neuralgia and neuritis, unspecified: Secondary | ICD-10-CM | POA: Diagnosis not present

## 2020-11-21 DIAGNOSIS — M160 Bilateral primary osteoarthritis of hip: Secondary | ICD-10-CM | POA: Diagnosis not present

## 2020-11-26 DIAGNOSIS — Z466 Encounter for fitting and adjustment of urinary device: Secondary | ICD-10-CM | POA: Diagnosis not present

## 2020-11-26 DIAGNOSIS — R339 Retention of urine, unspecified: Secondary | ICD-10-CM | POA: Diagnosis not present

## 2020-11-26 DIAGNOSIS — F32A Depression, unspecified: Secondary | ICD-10-CM | POA: Diagnosis not present

## 2020-11-26 DIAGNOSIS — F321 Major depressive disorder, single episode, moderate: Secondary | ICD-10-CM | POA: Diagnosis not present

## 2020-11-26 DIAGNOSIS — Z981 Arthrodesis status: Secondary | ICD-10-CM | POA: Diagnosis not present

## 2020-11-26 DIAGNOSIS — M170 Bilateral primary osteoarthritis of knee: Secondary | ICD-10-CM | POA: Diagnosis not present

## 2020-11-26 DIAGNOSIS — M792 Neuralgia and neuritis, unspecified: Secondary | ICD-10-CM | POA: Diagnosis not present

## 2020-11-26 DIAGNOSIS — E785 Hyperlipidemia, unspecified: Secondary | ICD-10-CM | POA: Diagnosis not present

## 2020-11-26 DIAGNOSIS — Z8781 Personal history of (healed) traumatic fracture: Secondary | ICD-10-CM | POA: Diagnosis not present

## 2020-11-26 DIAGNOSIS — M160 Bilateral primary osteoarthritis of hip: Secondary | ICD-10-CM | POA: Diagnosis not present

## 2020-11-26 DIAGNOSIS — E669 Obesity, unspecified: Secondary | ICD-10-CM | POA: Diagnosis not present

## 2020-11-26 DIAGNOSIS — Z6833 Body mass index (BMI) 33.0-33.9, adult: Secondary | ICD-10-CM | POA: Diagnosis not present

## 2020-11-26 DIAGNOSIS — I10 Essential (primary) hypertension: Secondary | ICD-10-CM | POA: Diagnosis not present

## 2020-11-28 DIAGNOSIS — R339 Retention of urine, unspecified: Secondary | ICD-10-CM | POA: Diagnosis not present

## 2020-12-03 DIAGNOSIS — L600 Ingrowing nail: Secondary | ICD-10-CM | POA: Diagnosis not present

## 2020-12-03 DIAGNOSIS — I70203 Unspecified atherosclerosis of native arteries of extremities, bilateral legs: Secondary | ICD-10-CM | POA: Diagnosis not present

## 2020-12-03 DIAGNOSIS — L851 Acquired keratosis [keratoderma] palmaris et plantaris: Secondary | ICD-10-CM | POA: Diagnosis not present

## 2020-12-03 DIAGNOSIS — L03031 Cellulitis of right toe: Secondary | ICD-10-CM | POA: Diagnosis not present

## 2020-12-05 DIAGNOSIS — R339 Retention of urine, unspecified: Secondary | ICD-10-CM | POA: Diagnosis not present

## 2020-12-05 DIAGNOSIS — F329 Major depressive disorder, single episode, unspecified: Secondary | ICD-10-CM | POA: Diagnosis not present

## 2020-12-05 DIAGNOSIS — I10 Essential (primary) hypertension: Secondary | ICD-10-CM | POA: Diagnosis not present

## 2020-12-05 DIAGNOSIS — G4700 Insomnia, unspecified: Secondary | ICD-10-CM | POA: Diagnosis not present

## 2020-12-05 DIAGNOSIS — Z978 Presence of other specified devices: Secondary | ICD-10-CM | POA: Diagnosis not present

## 2020-12-08 DIAGNOSIS — R339 Retention of urine, unspecified: Secondary | ICD-10-CM | POA: Diagnosis not present

## 2020-12-12 DIAGNOSIS — R339 Retention of urine, unspecified: Secondary | ICD-10-CM | POA: Diagnosis not present

## 2020-12-18 DIAGNOSIS — F321 Major depressive disorder, single episode, moderate: Secondary | ICD-10-CM | POA: Diagnosis not present

## 2020-12-19 DIAGNOSIS — M792 Neuralgia and neuritis, unspecified: Secondary | ICD-10-CM | POA: Diagnosis not present

## 2020-12-19 DIAGNOSIS — M160 Bilateral primary osteoarthritis of hip: Secondary | ICD-10-CM | POA: Diagnosis not present

## 2020-12-19 DIAGNOSIS — Z466 Encounter for fitting and adjustment of urinary device: Secondary | ICD-10-CM | POA: Diagnosis not present

## 2020-12-19 DIAGNOSIS — R339 Retention of urine, unspecified: Secondary | ICD-10-CM | POA: Diagnosis not present

## 2020-12-19 DIAGNOSIS — M170 Bilateral primary osteoarthritis of knee: Secondary | ICD-10-CM | POA: Diagnosis not present

## 2020-12-19 DIAGNOSIS — I10 Essential (primary) hypertension: Secondary | ICD-10-CM | POA: Diagnosis not present

## 2020-12-26 DIAGNOSIS — M170 Bilateral primary osteoarthritis of knee: Secondary | ICD-10-CM | POA: Diagnosis not present

## 2020-12-26 DIAGNOSIS — M792 Neuralgia and neuritis, unspecified: Secondary | ICD-10-CM | POA: Diagnosis not present

## 2020-12-26 DIAGNOSIS — I10 Essential (primary) hypertension: Secondary | ICD-10-CM | POA: Diagnosis not present

## 2020-12-26 DIAGNOSIS — Z981 Arthrodesis status: Secondary | ICD-10-CM | POA: Diagnosis not present

## 2020-12-26 DIAGNOSIS — F32A Depression, unspecified: Secondary | ICD-10-CM | POA: Diagnosis not present

## 2020-12-26 DIAGNOSIS — Z466 Encounter for fitting and adjustment of urinary device: Secondary | ICD-10-CM | POA: Diagnosis not present

## 2020-12-26 DIAGNOSIS — E785 Hyperlipidemia, unspecified: Secondary | ICD-10-CM | POA: Diagnosis not present

## 2020-12-26 DIAGNOSIS — M160 Bilateral primary osteoarthritis of hip: Secondary | ICD-10-CM | POA: Diagnosis not present

## 2020-12-26 DIAGNOSIS — R339 Retention of urine, unspecified: Secondary | ICD-10-CM | POA: Diagnosis not present

## 2020-12-26 DIAGNOSIS — E669 Obesity, unspecified: Secondary | ICD-10-CM | POA: Diagnosis not present

## 2020-12-26 DIAGNOSIS — Z6833 Body mass index (BMI) 33.0-33.9, adult: Secondary | ICD-10-CM | POA: Diagnosis not present

## 2020-12-26 DIAGNOSIS — Z8781 Personal history of (healed) traumatic fracture: Secondary | ICD-10-CM | POA: Diagnosis not present

## 2020-12-29 DIAGNOSIS — F411 Generalized anxiety disorder: Secondary | ICD-10-CM | POA: Diagnosis not present

## 2020-12-29 DIAGNOSIS — F331 Major depressive disorder, recurrent, moderate: Secondary | ICD-10-CM | POA: Diagnosis not present

## 2020-12-29 DIAGNOSIS — G4700 Insomnia, unspecified: Secondary | ICD-10-CM | POA: Diagnosis not present

## 2020-12-31 DIAGNOSIS — F321 Major depressive disorder, single episode, moderate: Secondary | ICD-10-CM | POA: Diagnosis not present

## 2021-01-05 DIAGNOSIS — M159 Polyosteoarthritis, unspecified: Secondary | ICD-10-CM | POA: Diagnosis not present

## 2021-01-05 DIAGNOSIS — F329 Major depressive disorder, single episode, unspecified: Secondary | ICD-10-CM | POA: Diagnosis not present

## 2021-01-05 DIAGNOSIS — I10 Essential (primary) hypertension: Secondary | ICD-10-CM | POA: Diagnosis not present

## 2021-01-05 DIAGNOSIS — G4700 Insomnia, unspecified: Secondary | ICD-10-CM | POA: Diagnosis not present

## 2021-01-09 DIAGNOSIS — Z79899 Other long term (current) drug therapy: Secondary | ICD-10-CM | POA: Diagnosis not present

## 2021-01-14 DIAGNOSIS — H353132 Nonexudative age-related macular degeneration, bilateral, intermediate dry stage: Secondary | ICD-10-CM | POA: Diagnosis not present

## 2021-01-14 DIAGNOSIS — H5201 Hypermetropia, right eye: Secondary | ICD-10-CM | POA: Diagnosis not present

## 2021-01-14 DIAGNOSIS — F321 Major depressive disorder, single episode, moderate: Secondary | ICD-10-CM | POA: Diagnosis not present

## 2021-01-14 DIAGNOSIS — H524 Presbyopia: Secondary | ICD-10-CM | POA: Diagnosis not present

## 2021-01-14 DIAGNOSIS — H52223 Regular astigmatism, bilateral: Secondary | ICD-10-CM | POA: Diagnosis not present

## 2021-02-03 DIAGNOSIS — G4700 Insomnia, unspecified: Secondary | ICD-10-CM | POA: Diagnosis not present

## 2021-02-03 DIAGNOSIS — F411 Generalized anxiety disorder: Secondary | ICD-10-CM | POA: Diagnosis not present

## 2021-02-03 DIAGNOSIS — F331 Major depressive disorder, recurrent, moderate: Secondary | ICD-10-CM | POA: Diagnosis not present

## 2021-02-18 DIAGNOSIS — M79675 Pain in left toe(s): Secondary | ICD-10-CM | POA: Diagnosis not present

## 2021-02-18 DIAGNOSIS — B351 Tinea unguium: Secondary | ICD-10-CM | POA: Diagnosis not present

## 2021-02-18 DIAGNOSIS — I70203 Unspecified atherosclerosis of native arteries of extremities, bilateral legs: Secondary | ICD-10-CM | POA: Diagnosis not present

## 2021-02-18 DIAGNOSIS — M79674 Pain in right toe(s): Secondary | ICD-10-CM | POA: Diagnosis not present

## 2021-02-18 DIAGNOSIS — L851 Acquired keratosis [keratoderma] palmaris et plantaris: Secondary | ICD-10-CM | POA: Diagnosis not present

## 2021-02-27 DIAGNOSIS — S0990XA Unspecified injury of head, initial encounter: Secondary | ICD-10-CM | POA: Diagnosis not present

## 2021-02-27 DIAGNOSIS — R0902 Hypoxemia: Secondary | ICD-10-CM | POA: Diagnosis not present

## 2021-02-27 DIAGNOSIS — R1084 Generalized abdominal pain: Secondary | ICD-10-CM | POA: Diagnosis not present

## 2021-02-27 DIAGNOSIS — E049 Nontoxic goiter, unspecified: Secondary | ICD-10-CM | POA: Diagnosis not present

## 2021-02-27 DIAGNOSIS — W19XXXA Unspecified fall, initial encounter: Secondary | ICD-10-CM | POA: Diagnosis not present

## 2021-02-27 DIAGNOSIS — F32A Depression, unspecified: Secondary | ICD-10-CM | POA: Diagnosis present

## 2021-02-27 DIAGNOSIS — Z043 Encounter for examination and observation following other accident: Secondary | ICD-10-CM | POA: Diagnosis not present

## 2021-02-27 DIAGNOSIS — S3992XA Unspecified injury of lower back, initial encounter: Secondary | ICD-10-CM | POA: Diagnosis not present

## 2021-02-27 DIAGNOSIS — K769 Liver disease, unspecified: Secondary | ICD-10-CM | POA: Diagnosis not present

## 2021-02-27 DIAGNOSIS — J984 Other disorders of lung: Secondary | ICD-10-CM | POA: Diagnosis not present

## 2021-02-27 DIAGNOSIS — E042 Nontoxic multinodular goiter: Secondary | ICD-10-CM | POA: Diagnosis present

## 2021-02-27 DIAGNOSIS — K529 Noninfective gastroenteritis and colitis, unspecified: Secondary | ICD-10-CM | POA: Diagnosis not present

## 2021-02-27 DIAGNOSIS — R6520 Severe sepsis without septic shock: Secondary | ICD-10-CM | POA: Diagnosis present

## 2021-02-27 DIAGNOSIS — Z86711 Personal history of pulmonary embolism: Secondary | ICD-10-CM | POA: Diagnosis not present

## 2021-02-27 DIAGNOSIS — R109 Unspecified abdominal pain: Secondary | ICD-10-CM | POA: Diagnosis not present

## 2021-02-27 DIAGNOSIS — K559 Vascular disorder of intestine, unspecified: Secondary | ICD-10-CM | POA: Diagnosis not present

## 2021-02-27 DIAGNOSIS — F419 Anxiety disorder, unspecified: Secondary | ICD-10-CM | POA: Diagnosis present

## 2021-02-27 DIAGNOSIS — M47812 Spondylosis without myelopathy or radiculopathy, cervical region: Secondary | ICD-10-CM | POA: Diagnosis not present

## 2021-02-27 DIAGNOSIS — G9341 Metabolic encephalopathy: Secondary | ICD-10-CM | POA: Diagnosis not present

## 2021-02-27 DIAGNOSIS — A09 Infectious gastroenteritis and colitis, unspecified: Secondary | ICD-10-CM | POA: Diagnosis not present

## 2021-02-27 DIAGNOSIS — Z743 Need for continuous supervision: Secondary | ICD-10-CM | POA: Diagnosis not present

## 2021-02-27 DIAGNOSIS — S79929A Unspecified injury of unspecified thigh, initial encounter: Secondary | ICD-10-CM | POA: Diagnosis not present

## 2021-02-27 DIAGNOSIS — K5900 Constipation, unspecified: Secondary | ICD-10-CM | POA: Diagnosis not present

## 2021-02-27 DIAGNOSIS — Z66 Do not resuscitate: Secondary | ICD-10-CM | POA: Diagnosis present

## 2021-02-27 DIAGNOSIS — S24109A Unspecified injury at unspecified level of thoracic spinal cord, initial encounter: Secondary | ICD-10-CM | POA: Diagnosis not present

## 2021-02-27 DIAGNOSIS — D649 Anemia, unspecified: Secondary | ICD-10-CM | POA: Diagnosis present

## 2021-02-27 DIAGNOSIS — F05 Delirium due to known physiological condition: Secondary | ICD-10-CM | POA: Diagnosis not present

## 2021-02-27 DIAGNOSIS — S2243XA Multiple fractures of ribs, bilateral, initial encounter for closed fracture: Secondary | ICD-10-CM | POA: Diagnosis not present

## 2021-02-27 DIAGNOSIS — E871 Hypo-osmolality and hyponatremia: Secondary | ICD-10-CM | POA: Diagnosis not present

## 2021-02-27 DIAGNOSIS — K219 Gastro-esophageal reflux disease without esophagitis: Secondary | ICD-10-CM | POA: Diagnosis present

## 2021-02-27 DIAGNOSIS — R933 Abnormal findings on diagnostic imaging of other parts of digestive tract: Secondary | ICD-10-CM | POA: Diagnosis not present

## 2021-02-27 DIAGNOSIS — R197 Diarrhea, unspecified: Secondary | ICD-10-CM | POA: Diagnosis not present

## 2021-02-27 DIAGNOSIS — K449 Diaphragmatic hernia without obstruction or gangrene: Secondary | ICD-10-CM | POA: Diagnosis not present

## 2021-02-27 DIAGNOSIS — E785 Hyperlipidemia, unspecified: Secondary | ICD-10-CM | POA: Diagnosis present

## 2021-02-27 DIAGNOSIS — R32 Unspecified urinary incontinence: Secondary | ICD-10-CM | POA: Diagnosis present

## 2021-02-27 DIAGNOSIS — A419 Sepsis, unspecified organism: Secondary | ICD-10-CM | POA: Diagnosis present

## 2021-02-27 DIAGNOSIS — S199XXA Unspecified injury of neck, initial encounter: Secondary | ICD-10-CM | POA: Diagnosis not present

## 2021-02-27 DIAGNOSIS — N179 Acute kidney failure, unspecified: Secondary | ICD-10-CM | POA: Diagnosis present

## 2021-02-27 DIAGNOSIS — I517 Cardiomegaly: Secondary | ICD-10-CM | POA: Diagnosis not present

## 2021-02-27 DIAGNOSIS — E119 Type 2 diabetes mellitus without complications: Secondary | ICD-10-CM | POA: Diagnosis present

## 2021-02-27 DIAGNOSIS — Z20822 Contact with and (suspected) exposure to covid-19: Secondary | ICD-10-CM | POA: Diagnosis present

## 2021-02-27 DIAGNOSIS — Z79899 Other long term (current) drug therapy: Secondary | ICD-10-CM | POA: Diagnosis not present

## 2021-02-27 DIAGNOSIS — E861 Hypovolemia: Secondary | ICD-10-CM | POA: Diagnosis present

## 2021-02-27 DIAGNOSIS — S3991XA Unspecified injury of abdomen, initial encounter: Secondary | ICD-10-CM | POA: Diagnosis not present

## 2021-02-27 DIAGNOSIS — J449 Chronic obstructive pulmonary disease, unspecified: Secondary | ICD-10-CM | POA: Diagnosis present

## 2021-02-27 DIAGNOSIS — M069 Rheumatoid arthritis, unspecified: Secondary | ICD-10-CM | POA: Diagnosis present

## 2021-02-27 DIAGNOSIS — I10 Essential (primary) hypertension: Secondary | ICD-10-CM | POA: Diagnosis present

## 2021-02-27 DIAGNOSIS — Z9049 Acquired absence of other specified parts of digestive tract: Secondary | ICD-10-CM | POA: Diagnosis not present

## 2021-02-27 DIAGNOSIS — R52 Pain, unspecified: Secondary | ICD-10-CM | POA: Diagnosis not present

## 2021-02-27 DIAGNOSIS — S3993XA Unspecified injury of pelvis, initial encounter: Secondary | ICD-10-CM | POA: Diagnosis not present

## 2021-02-27 DIAGNOSIS — E860 Dehydration: Secondary | ICD-10-CM | POA: Diagnosis not present

## 2021-02-27 DIAGNOSIS — S299XXA Unspecified injury of thorax, initial encounter: Secondary | ICD-10-CM | POA: Diagnosis not present

## 2021-02-27 DIAGNOSIS — S2242XA Multiple fractures of ribs, left side, initial encounter for closed fracture: Secondary | ICD-10-CM | POA: Diagnosis not present

## 2021-03-06 DIAGNOSIS — I10 Essential (primary) hypertension: Secondary | ICD-10-CM | POA: Diagnosis not present

## 2021-03-06 DIAGNOSIS — K529 Noninfective gastroenteritis and colitis, unspecified: Secondary | ICD-10-CM | POA: Diagnosis not present

## 2021-03-09 DIAGNOSIS — R197 Diarrhea, unspecified: Secondary | ICD-10-CM | POA: Diagnosis not present

## 2021-03-09 DIAGNOSIS — R1084 Generalized abdominal pain: Secondary | ICD-10-CM | POA: Diagnosis not present

## 2021-03-09 DIAGNOSIS — N281 Cyst of kidney, acquired: Secondary | ICD-10-CM | POA: Diagnosis not present

## 2021-03-09 DIAGNOSIS — D649 Anemia, unspecified: Secondary | ICD-10-CM | POA: Diagnosis not present

## 2021-03-09 DIAGNOSIS — K5900 Constipation, unspecified: Secondary | ICD-10-CM | POA: Diagnosis not present

## 2021-03-09 DIAGNOSIS — E1165 Type 2 diabetes mellitus with hyperglycemia: Secondary | ICD-10-CM | POA: Diagnosis not present

## 2021-03-09 DIAGNOSIS — Z743 Need for continuous supervision: Secondary | ICD-10-CM | POA: Diagnosis not present

## 2021-03-09 DIAGNOSIS — Z79899 Other long term (current) drug therapy: Secondary | ICD-10-CM | POA: Diagnosis not present

## 2021-03-09 DIAGNOSIS — K449 Diaphragmatic hernia without obstruction or gangrene: Secondary | ICD-10-CM | POA: Diagnosis not present

## 2021-03-09 DIAGNOSIS — K219 Gastro-esophageal reflux disease without esophagitis: Secondary | ICD-10-CM | POA: Diagnosis present

## 2021-03-09 DIAGNOSIS — K7689 Other specified diseases of liver: Secondary | ICD-10-CM | POA: Diagnosis not present

## 2021-03-09 DIAGNOSIS — F32A Depression, unspecified: Secondary | ICD-10-CM | POA: Diagnosis present

## 2021-03-09 DIAGNOSIS — Z86711 Personal history of pulmonary embolism: Secondary | ICD-10-CM | POA: Diagnosis not present

## 2021-03-09 DIAGNOSIS — J449 Chronic obstructive pulmonary disease, unspecified: Secondary | ICD-10-CM | POA: Diagnosis present

## 2021-03-09 DIAGNOSIS — R1030 Lower abdominal pain, unspecified: Secondary | ICD-10-CM | POA: Diagnosis not present

## 2021-03-09 DIAGNOSIS — N390 Urinary tract infection, site not specified: Secondary | ICD-10-CM | POA: Diagnosis not present

## 2021-03-09 DIAGNOSIS — M329 Systemic lupus erythematosus, unspecified: Secondary | ICD-10-CM | POA: Diagnosis present

## 2021-03-09 DIAGNOSIS — E785 Hyperlipidemia, unspecified: Secondary | ICD-10-CM | POA: Diagnosis not present

## 2021-03-09 DIAGNOSIS — E876 Hypokalemia: Secondary | ICD-10-CM | POA: Diagnosis present

## 2021-03-09 DIAGNOSIS — I10 Essential (primary) hypertension: Secondary | ICD-10-CM | POA: Diagnosis present

## 2021-03-09 DIAGNOSIS — K649 Unspecified hemorrhoids: Secondary | ICD-10-CM | POA: Diagnosis present

## 2021-03-09 DIAGNOSIS — F419 Anxiety disorder, unspecified: Secondary | ICD-10-CM | POA: Diagnosis present

## 2021-03-09 DIAGNOSIS — E8809 Other disorders of plasma-protein metabolism, not elsewhere classified: Secondary | ICD-10-CM | POA: Diagnosis present

## 2021-03-09 DIAGNOSIS — Z66 Do not resuscitate: Secondary | ICD-10-CM | POA: Diagnosis not present

## 2021-03-09 DIAGNOSIS — M069 Rheumatoid arthritis, unspecified: Secondary | ICD-10-CM | POA: Diagnosis present

## 2021-03-09 DIAGNOSIS — Z8701 Personal history of pneumonia (recurrent): Secondary | ICD-10-CM | POA: Diagnosis not present

## 2021-03-09 DIAGNOSIS — K529 Noninfective gastroenteritis and colitis, unspecified: Secondary | ICD-10-CM | POA: Diagnosis present

## 2021-03-09 DIAGNOSIS — Z87891 Personal history of nicotine dependence: Secondary | ICD-10-CM | POA: Diagnosis not present

## 2021-03-09 DIAGNOSIS — R531 Weakness: Secondary | ICD-10-CM | POA: Diagnosis not present

## 2021-03-09 DIAGNOSIS — R109 Unspecified abdominal pain: Secondary | ICD-10-CM | POA: Diagnosis not present

## 2021-03-09 DIAGNOSIS — K6289 Other specified diseases of anus and rectum: Secondary | ICD-10-CM | POA: Diagnosis not present

## 2021-03-09 DIAGNOSIS — E1140 Type 2 diabetes mellitus with diabetic neuropathy, unspecified: Secondary | ICD-10-CM | POA: Diagnosis present

## 2021-03-10 DIAGNOSIS — R109 Unspecified abdominal pain: Secondary | ICD-10-CM | POA: Diagnosis not present

## 2021-03-10 DIAGNOSIS — N390 Urinary tract infection, site not specified: Secondary | ICD-10-CM | POA: Diagnosis not present

## 2021-03-11 DIAGNOSIS — R109 Unspecified abdominal pain: Secondary | ICD-10-CM | POA: Diagnosis not present

## 2021-03-11 DIAGNOSIS — N390 Urinary tract infection, site not specified: Secondary | ICD-10-CM | POA: Diagnosis not present

## 2021-03-12 DIAGNOSIS — R109 Unspecified abdominal pain: Secondary | ICD-10-CM | POA: Diagnosis not present

## 2021-03-12 DIAGNOSIS — N390 Urinary tract infection, site not specified: Secondary | ICD-10-CM | POA: Diagnosis not present

## 2021-03-13 DIAGNOSIS — N390 Urinary tract infection, site not specified: Secondary | ICD-10-CM | POA: Diagnosis not present

## 2021-03-13 DIAGNOSIS — R109 Unspecified abdominal pain: Secondary | ICD-10-CM | POA: Diagnosis not present

## 2021-03-14 DIAGNOSIS — R109 Unspecified abdominal pain: Secondary | ICD-10-CM | POA: Diagnosis not present

## 2021-03-14 DIAGNOSIS — N390 Urinary tract infection, site not specified: Secondary | ICD-10-CM | POA: Diagnosis not present

## 2021-03-15 DIAGNOSIS — N390 Urinary tract infection, site not specified: Secondary | ICD-10-CM | POA: Diagnosis not present

## 2021-03-15 DIAGNOSIS — R109 Unspecified abdominal pain: Secondary | ICD-10-CM | POA: Diagnosis not present

## 2021-03-16 DIAGNOSIS — R109 Unspecified abdominal pain: Secondary | ICD-10-CM | POA: Diagnosis not present

## 2021-03-16 DIAGNOSIS — N390 Urinary tract infection, site not specified: Secondary | ICD-10-CM | POA: Diagnosis not present

## 2021-03-19 DIAGNOSIS — Z981 Arthrodesis status: Secondary | ICD-10-CM | POA: Diagnosis not present

## 2021-03-19 DIAGNOSIS — E876 Hypokalemia: Secondary | ICD-10-CM | POA: Diagnosis not present

## 2021-03-19 DIAGNOSIS — F329 Major depressive disorder, single episode, unspecified: Secondary | ICD-10-CM | POA: Diagnosis not present

## 2021-03-19 DIAGNOSIS — K219 Gastro-esophageal reflux disease without esophagitis: Secondary | ICD-10-CM | POA: Diagnosis not present

## 2021-03-19 DIAGNOSIS — Z87891 Personal history of nicotine dependence: Secondary | ICD-10-CM | POA: Diagnosis not present

## 2021-03-19 DIAGNOSIS — Z8781 Personal history of (healed) traumatic fracture: Secondary | ICD-10-CM | POA: Diagnosis not present

## 2021-03-19 DIAGNOSIS — J449 Chronic obstructive pulmonary disease, unspecified: Secondary | ICD-10-CM | POA: Diagnosis not present

## 2021-03-19 DIAGNOSIS — M48061 Spinal stenosis, lumbar region without neurogenic claudication: Secondary | ICD-10-CM | POA: Diagnosis not present

## 2021-03-19 DIAGNOSIS — I2699 Other pulmonary embolism without acute cor pulmonale: Secondary | ICD-10-CM | POA: Diagnosis not present

## 2021-03-19 DIAGNOSIS — F32A Depression, unspecified: Secondary | ICD-10-CM | POA: Diagnosis not present

## 2021-03-19 DIAGNOSIS — M1990 Unspecified osteoarthritis, unspecified site: Secondary | ICD-10-CM | POA: Diagnosis not present

## 2021-03-19 DIAGNOSIS — E785 Hyperlipidemia, unspecified: Secondary | ICD-10-CM | POA: Diagnosis not present

## 2021-03-19 DIAGNOSIS — M069 Rheumatoid arthritis, unspecified: Secondary | ICD-10-CM | POA: Diagnosis not present

## 2021-03-19 DIAGNOSIS — G629 Polyneuropathy, unspecified: Secondary | ICD-10-CM | POA: Diagnosis not present

## 2021-03-19 DIAGNOSIS — I10 Essential (primary) hypertension: Secondary | ICD-10-CM | POA: Diagnosis not present

## 2021-03-19 DIAGNOSIS — E8809 Other disorders of plasma-protein metabolism, not elsewhere classified: Secondary | ICD-10-CM | POA: Diagnosis not present

## 2021-03-19 DIAGNOSIS — U071 COVID-19: Secondary | ICD-10-CM | POA: Diagnosis not present

## 2021-03-19 DIAGNOSIS — G40909 Epilepsy, unspecified, not intractable, without status epilepticus: Secondary | ICD-10-CM | POA: Diagnosis not present

## 2021-03-19 DIAGNOSIS — M159 Polyosteoarthritis, unspecified: Secondary | ICD-10-CM | POA: Diagnosis not present

## 2021-03-19 DIAGNOSIS — F419 Anxiety disorder, unspecified: Secondary | ICD-10-CM | POA: Diagnosis not present

## 2021-03-19 DIAGNOSIS — M329 Systemic lupus erythematosus, unspecified: Secondary | ICD-10-CM | POA: Diagnosis not present

## 2021-03-19 DIAGNOSIS — D649 Anemia, unspecified: Secondary | ICD-10-CM | POA: Diagnosis not present

## 2021-03-25 DIAGNOSIS — G4700 Insomnia, unspecified: Secondary | ICD-10-CM | POA: Diagnosis not present

## 2021-03-25 DIAGNOSIS — D649 Anemia, unspecified: Secondary | ICD-10-CM | POA: Diagnosis not present

## 2021-03-25 DIAGNOSIS — I2699 Other pulmonary embolism without acute cor pulmonale: Secondary | ICD-10-CM | POA: Diagnosis not present

## 2021-03-25 DIAGNOSIS — J449 Chronic obstructive pulmonary disease, unspecified: Secondary | ICD-10-CM | POA: Diagnosis not present

## 2021-03-25 DIAGNOSIS — F411 Generalized anxiety disorder: Secondary | ICD-10-CM | POA: Diagnosis not present

## 2021-03-25 DIAGNOSIS — F331 Major depressive disorder, recurrent, moderate: Secondary | ICD-10-CM | POA: Diagnosis not present

## 2021-03-25 DIAGNOSIS — U071 COVID-19: Secondary | ICD-10-CM | POA: Diagnosis not present

## 2021-03-25 DIAGNOSIS — E876 Hypokalemia: Secondary | ICD-10-CM | POA: Diagnosis not present

## 2021-03-26 DIAGNOSIS — D649 Anemia, unspecified: Secondary | ICD-10-CM | POA: Diagnosis not present

## 2021-03-26 DIAGNOSIS — I2699 Other pulmonary embolism without acute cor pulmonale: Secondary | ICD-10-CM | POA: Diagnosis not present

## 2021-03-26 DIAGNOSIS — U071 COVID-19: Secondary | ICD-10-CM | POA: Diagnosis not present

## 2021-03-26 DIAGNOSIS — E876 Hypokalemia: Secondary | ICD-10-CM | POA: Diagnosis not present

## 2021-03-26 DIAGNOSIS — J449 Chronic obstructive pulmonary disease, unspecified: Secondary | ICD-10-CM | POA: Diagnosis not present

## 2021-04-03 DIAGNOSIS — D649 Anemia, unspecified: Secondary | ICD-10-CM | POA: Diagnosis not present

## 2021-04-03 DIAGNOSIS — E876 Hypokalemia: Secondary | ICD-10-CM | POA: Diagnosis not present

## 2021-04-03 DIAGNOSIS — I2699 Other pulmonary embolism without acute cor pulmonale: Secondary | ICD-10-CM | POA: Diagnosis not present

## 2021-04-03 DIAGNOSIS — J449 Chronic obstructive pulmonary disease, unspecified: Secondary | ICD-10-CM | POA: Diagnosis not present

## 2021-04-03 DIAGNOSIS — U071 COVID-19: Secondary | ICD-10-CM | POA: Diagnosis not present

## 2021-04-07 DIAGNOSIS — E119 Type 2 diabetes mellitus without complications: Secondary | ICD-10-CM | POA: Diagnosis not present

## 2021-04-07 DIAGNOSIS — R54 Age-related physical debility: Secondary | ICD-10-CM | POA: Diagnosis not present

## 2021-04-07 DIAGNOSIS — Z87310 Personal history of (healed) osteoporosis fracture: Secondary | ICD-10-CM | POA: Diagnosis not present

## 2021-04-07 DIAGNOSIS — M6281 Muscle weakness (generalized): Secondary | ICD-10-CM | POA: Diagnosis not present

## 2021-04-07 DIAGNOSIS — M069 Rheumatoid arthritis, unspecified: Secondary | ICD-10-CM | POA: Diagnosis not present

## 2021-04-07 DIAGNOSIS — G4089 Other seizures: Secondary | ICD-10-CM | POA: Diagnosis not present

## 2021-04-07 DIAGNOSIS — R2689 Other abnormalities of gait and mobility: Secondary | ICD-10-CM | POA: Diagnosis not present

## 2021-04-07 DIAGNOSIS — U071 COVID-19: Secondary | ICD-10-CM | POA: Diagnosis not present

## 2021-04-07 DIAGNOSIS — F321 Major depressive disorder, single episode, moderate: Secondary | ICD-10-CM | POA: Diagnosis not present

## 2021-04-07 DIAGNOSIS — F419 Anxiety disorder, unspecified: Secondary | ICD-10-CM | POA: Diagnosis not present

## 2021-04-07 DIAGNOSIS — R41841 Cognitive communication deficit: Secondary | ICD-10-CM | POA: Diagnosis not present

## 2021-04-07 DIAGNOSIS — E785 Hyperlipidemia, unspecified: Secondary | ICD-10-CM | POA: Diagnosis not present

## 2021-04-07 DIAGNOSIS — I10 Essential (primary) hypertension: Secondary | ICD-10-CM | POA: Diagnosis not present

## 2021-04-07 DIAGNOSIS — E782 Mixed hyperlipidemia: Secondary | ICD-10-CM | POA: Diagnosis not present

## 2021-04-07 DIAGNOSIS — M810 Age-related osteoporosis without current pathological fracture: Secondary | ICD-10-CM | POA: Diagnosis not present

## 2021-04-07 DIAGNOSIS — M6250 Muscle wasting and atrophy, not elsewhere classified, unspecified site: Secondary | ICD-10-CM | POA: Diagnosis not present

## 2021-04-07 DIAGNOSIS — R2681 Unsteadiness on feet: Secondary | ICD-10-CM | POA: Diagnosis not present

## 2021-04-07 DIAGNOSIS — F32A Depression, unspecified: Secondary | ICD-10-CM | POA: Diagnosis not present

## 2021-04-07 DIAGNOSIS — R1312 Dysphagia, oropharyngeal phase: Secondary | ICD-10-CM | POA: Diagnosis not present

## 2021-04-08 DIAGNOSIS — E119 Type 2 diabetes mellitus without complications: Secondary | ICD-10-CM | POA: Diagnosis not present

## 2021-04-08 DIAGNOSIS — M069 Rheumatoid arthritis, unspecified: Secondary | ICD-10-CM | POA: Diagnosis not present

## 2021-04-08 DIAGNOSIS — I10 Essential (primary) hypertension: Secondary | ICD-10-CM | POA: Diagnosis not present

## 2021-04-08 DIAGNOSIS — F321 Major depressive disorder, single episode, moderate: Secondary | ICD-10-CM | POA: Diagnosis not present

## 2021-04-08 DIAGNOSIS — E782 Mixed hyperlipidemia: Secondary | ICD-10-CM | POA: Diagnosis not present

## 2021-04-08 DIAGNOSIS — M810 Age-related osteoporosis without current pathological fracture: Secondary | ICD-10-CM | POA: Diagnosis not present

## 2021-04-08 DIAGNOSIS — F419 Anxiety disorder, unspecified: Secondary | ICD-10-CM | POA: Diagnosis not present

## 2021-04-08 DIAGNOSIS — G4089 Other seizures: Secondary | ICD-10-CM | POA: Diagnosis not present

## 2021-04-08 DIAGNOSIS — F32A Depression, unspecified: Secondary | ICD-10-CM | POA: Diagnosis not present

## 2021-04-08 DIAGNOSIS — Z87310 Personal history of (healed) osteoporosis fracture: Secondary | ICD-10-CM | POA: Diagnosis not present

## 2021-04-08 DIAGNOSIS — R54 Age-related physical debility: Secondary | ICD-10-CM | POA: Diagnosis not present

## 2021-04-08 DIAGNOSIS — E785 Hyperlipidemia, unspecified: Secondary | ICD-10-CM | POA: Diagnosis not present

## 2021-04-10 DIAGNOSIS — F321 Major depressive disorder, single episode, moderate: Secondary | ICD-10-CM | POA: Diagnosis not present

## 2021-04-14 DIAGNOSIS — E119 Type 2 diabetes mellitus without complications: Secondary | ICD-10-CM | POA: Diagnosis not present

## 2021-04-14 DIAGNOSIS — U071 COVID-19: Secondary | ICD-10-CM | POA: Diagnosis not present

## 2021-04-14 DIAGNOSIS — F419 Anxiety disorder, unspecified: Secondary | ICD-10-CM | POA: Diagnosis not present

## 2021-04-14 DIAGNOSIS — I10 Essential (primary) hypertension: Secondary | ICD-10-CM | POA: Diagnosis not present

## 2021-04-14 DIAGNOSIS — F32A Depression, unspecified: Secondary | ICD-10-CM | POA: Diagnosis not present

## 2021-04-14 DIAGNOSIS — M069 Rheumatoid arthritis, unspecified: Secondary | ICD-10-CM | POA: Diagnosis not present

## 2021-04-16 DIAGNOSIS — J449 Chronic obstructive pulmonary disease, unspecified: Secondary | ICD-10-CM | POA: Diagnosis not present

## 2021-04-16 DIAGNOSIS — D649 Anemia, unspecified: Secondary | ICD-10-CM | POA: Diagnosis not present

## 2021-04-16 DIAGNOSIS — I2699 Other pulmonary embolism without acute cor pulmonale: Secondary | ICD-10-CM | POA: Diagnosis not present

## 2021-04-16 DIAGNOSIS — U071 COVID-19: Secondary | ICD-10-CM | POA: Diagnosis not present

## 2021-04-16 DIAGNOSIS — E876 Hypokalemia: Secondary | ICD-10-CM | POA: Diagnosis not present

## 2021-04-18 DIAGNOSIS — K219 Gastro-esophageal reflux disease without esophagitis: Secondary | ICD-10-CM | POA: Diagnosis not present

## 2021-04-18 DIAGNOSIS — U071 COVID-19: Secondary | ICD-10-CM | POA: Diagnosis not present

## 2021-04-18 DIAGNOSIS — I10 Essential (primary) hypertension: Secondary | ICD-10-CM | POA: Diagnosis not present

## 2021-04-18 DIAGNOSIS — F419 Anxiety disorder, unspecified: Secondary | ICD-10-CM | POA: Diagnosis not present

## 2021-04-18 DIAGNOSIS — D649 Anemia, unspecified: Secondary | ICD-10-CM | POA: Diagnosis not present

## 2021-04-18 DIAGNOSIS — F32A Depression, unspecified: Secondary | ICD-10-CM | POA: Diagnosis not present

## 2021-04-18 DIAGNOSIS — E785 Hyperlipidemia, unspecified: Secondary | ICD-10-CM | POA: Diagnosis not present

## 2021-04-18 DIAGNOSIS — G629 Polyneuropathy, unspecified: Secondary | ICD-10-CM | POA: Diagnosis not present

## 2021-04-18 DIAGNOSIS — Z981 Arthrodesis status: Secondary | ICD-10-CM | POA: Diagnosis not present

## 2021-04-18 DIAGNOSIS — M069 Rheumatoid arthritis, unspecified: Secondary | ICD-10-CM | POA: Diagnosis not present

## 2021-04-18 DIAGNOSIS — E876 Hypokalemia: Secondary | ICD-10-CM | POA: Diagnosis not present

## 2021-04-18 DIAGNOSIS — E8809 Other disorders of plasma-protein metabolism, not elsewhere classified: Secondary | ICD-10-CM | POA: Diagnosis not present

## 2021-04-18 DIAGNOSIS — M1990 Unspecified osteoarthritis, unspecified site: Secondary | ICD-10-CM | POA: Diagnosis not present

## 2021-04-18 DIAGNOSIS — Z8781 Personal history of (healed) traumatic fracture: Secondary | ICD-10-CM | POA: Diagnosis not present

## 2021-04-18 DIAGNOSIS — M329 Systemic lupus erythematosus, unspecified: Secondary | ICD-10-CM | POA: Diagnosis not present

## 2021-04-18 DIAGNOSIS — I2699 Other pulmonary embolism without acute cor pulmonale: Secondary | ICD-10-CM | POA: Diagnosis not present

## 2021-04-18 DIAGNOSIS — M48061 Spinal stenosis, lumbar region without neurogenic claudication: Secondary | ICD-10-CM | POA: Diagnosis not present

## 2021-04-18 DIAGNOSIS — Z87891 Personal history of nicotine dependence: Secondary | ICD-10-CM | POA: Diagnosis not present

## 2021-04-18 DIAGNOSIS — J449 Chronic obstructive pulmonary disease, unspecified: Secondary | ICD-10-CM | POA: Diagnosis not present

## 2021-04-18 DIAGNOSIS — G40909 Epilepsy, unspecified, not intractable, without status epilepticus: Secondary | ICD-10-CM | POA: Diagnosis not present

## 2021-04-21 DIAGNOSIS — J449 Chronic obstructive pulmonary disease, unspecified: Secondary | ICD-10-CM | POA: Diagnosis not present

## 2021-04-21 DIAGNOSIS — F411 Generalized anxiety disorder: Secondary | ICD-10-CM | POA: Diagnosis not present

## 2021-04-21 DIAGNOSIS — U071 COVID-19: Secondary | ICD-10-CM | POA: Diagnosis not present

## 2021-04-21 DIAGNOSIS — I2699 Other pulmonary embolism without acute cor pulmonale: Secondary | ICD-10-CM | POA: Diagnosis not present

## 2021-04-21 DIAGNOSIS — E876 Hypokalemia: Secondary | ICD-10-CM | POA: Diagnosis not present

## 2021-04-21 DIAGNOSIS — D649 Anemia, unspecified: Secondary | ICD-10-CM | POA: Diagnosis not present

## 2021-04-21 DIAGNOSIS — G4700 Insomnia, unspecified: Secondary | ICD-10-CM | POA: Diagnosis not present

## 2021-04-21 DIAGNOSIS — F331 Major depressive disorder, recurrent, moderate: Secondary | ICD-10-CM | POA: Diagnosis not present

## 2021-04-27 DIAGNOSIS — U071 COVID-19: Secondary | ICD-10-CM | POA: Diagnosis not present

## 2021-04-27 DIAGNOSIS — E876 Hypokalemia: Secondary | ICD-10-CM | POA: Diagnosis not present

## 2021-04-27 DIAGNOSIS — D649 Anemia, unspecified: Secondary | ICD-10-CM | POA: Diagnosis not present

## 2021-04-27 DIAGNOSIS — I2699 Other pulmonary embolism without acute cor pulmonale: Secondary | ICD-10-CM | POA: Diagnosis not present

## 2021-04-27 DIAGNOSIS — J449 Chronic obstructive pulmonary disease, unspecified: Secondary | ICD-10-CM | POA: Diagnosis not present

## 2021-04-28 DIAGNOSIS — I70203 Unspecified atherosclerosis of native arteries of extremities, bilateral legs: Secondary | ICD-10-CM | POA: Diagnosis not present

## 2021-04-28 DIAGNOSIS — L851 Acquired keratosis [keratoderma] palmaris et plantaris: Secondary | ICD-10-CM | POA: Diagnosis not present

## 2021-04-28 DIAGNOSIS — L600 Ingrowing nail: Secondary | ICD-10-CM | POA: Diagnosis not present

## 2021-04-28 DIAGNOSIS — M79674 Pain in right toe(s): Secondary | ICD-10-CM | POA: Diagnosis not present

## 2021-04-28 DIAGNOSIS — M79675 Pain in left toe(s): Secondary | ICD-10-CM | POA: Diagnosis not present

## 2021-04-30 DIAGNOSIS — J449 Chronic obstructive pulmonary disease, unspecified: Secondary | ICD-10-CM | POA: Diagnosis not present

## 2021-04-30 DIAGNOSIS — U071 COVID-19: Secondary | ICD-10-CM | POA: Diagnosis not present

## 2021-04-30 DIAGNOSIS — E876 Hypokalemia: Secondary | ICD-10-CM | POA: Diagnosis not present

## 2021-04-30 DIAGNOSIS — D649 Anemia, unspecified: Secondary | ICD-10-CM | POA: Diagnosis not present

## 2021-04-30 DIAGNOSIS — I2699 Other pulmonary embolism without acute cor pulmonale: Secondary | ICD-10-CM | POA: Diagnosis not present

## 2021-05-05 DIAGNOSIS — U071 COVID-19: Secondary | ICD-10-CM | POA: Diagnosis not present

## 2021-05-05 DIAGNOSIS — J449 Chronic obstructive pulmonary disease, unspecified: Secondary | ICD-10-CM | POA: Diagnosis not present

## 2021-05-05 DIAGNOSIS — E876 Hypokalemia: Secondary | ICD-10-CM | POA: Diagnosis not present

## 2021-05-05 DIAGNOSIS — D649 Anemia, unspecified: Secondary | ICD-10-CM | POA: Diagnosis not present

## 2021-05-05 DIAGNOSIS — I2699 Other pulmonary embolism without acute cor pulmonale: Secondary | ICD-10-CM | POA: Diagnosis not present

## 2021-05-07 DIAGNOSIS — D649 Anemia, unspecified: Secondary | ICD-10-CM | POA: Diagnosis not present

## 2021-05-07 DIAGNOSIS — J449 Chronic obstructive pulmonary disease, unspecified: Secondary | ICD-10-CM | POA: Diagnosis not present

## 2021-05-07 DIAGNOSIS — E876 Hypokalemia: Secondary | ICD-10-CM | POA: Diagnosis not present

## 2021-05-07 DIAGNOSIS — I2699 Other pulmonary embolism without acute cor pulmonale: Secondary | ICD-10-CM | POA: Diagnosis not present

## 2021-05-07 DIAGNOSIS — U071 COVID-19: Secondary | ICD-10-CM | POA: Diagnosis not present

## 2021-05-12 DIAGNOSIS — I2699 Other pulmonary embolism without acute cor pulmonale: Secondary | ICD-10-CM | POA: Diagnosis not present

## 2021-05-12 DIAGNOSIS — U071 COVID-19: Secondary | ICD-10-CM | POA: Diagnosis not present

## 2021-05-12 DIAGNOSIS — E876 Hypokalemia: Secondary | ICD-10-CM | POA: Diagnosis not present

## 2021-05-12 DIAGNOSIS — D649 Anemia, unspecified: Secondary | ICD-10-CM | POA: Diagnosis not present

## 2021-05-12 DIAGNOSIS — J449 Chronic obstructive pulmonary disease, unspecified: Secondary | ICD-10-CM | POA: Diagnosis not present

## 2021-05-14 DIAGNOSIS — I2699 Other pulmonary embolism without acute cor pulmonale: Secondary | ICD-10-CM | POA: Diagnosis not present

## 2021-05-14 DIAGNOSIS — U071 COVID-19: Secondary | ICD-10-CM | POA: Diagnosis not present

## 2021-05-14 DIAGNOSIS — J449 Chronic obstructive pulmonary disease, unspecified: Secondary | ICD-10-CM | POA: Diagnosis not present

## 2021-05-14 DIAGNOSIS — D649 Anemia, unspecified: Secondary | ICD-10-CM | POA: Diagnosis not present

## 2021-05-14 DIAGNOSIS — E876 Hypokalemia: Secondary | ICD-10-CM | POA: Diagnosis not present

## 2021-05-18 DIAGNOSIS — I2699 Other pulmonary embolism without acute cor pulmonale: Secondary | ICD-10-CM | POA: Diagnosis not present

## 2021-05-18 DIAGNOSIS — J449 Chronic obstructive pulmonary disease, unspecified: Secondary | ICD-10-CM | POA: Diagnosis not present

## 2021-05-18 DIAGNOSIS — M329 Systemic lupus erythematosus, unspecified: Secondary | ICD-10-CM | POA: Diagnosis not present

## 2021-05-18 DIAGNOSIS — E785 Hyperlipidemia, unspecified: Secondary | ICD-10-CM | POA: Diagnosis not present

## 2021-05-18 DIAGNOSIS — I10 Essential (primary) hypertension: Secondary | ICD-10-CM | POA: Diagnosis not present

## 2021-05-18 DIAGNOSIS — F329 Major depressive disorder, single episode, unspecified: Secondary | ICD-10-CM | POA: Diagnosis not present

## 2021-05-18 DIAGNOSIS — F32A Depression, unspecified: Secondary | ICD-10-CM | POA: Diagnosis not present

## 2021-05-18 DIAGNOSIS — Z87891 Personal history of nicotine dependence: Secondary | ICD-10-CM | POA: Diagnosis not present

## 2021-05-18 DIAGNOSIS — Z8781 Personal history of (healed) traumatic fracture: Secondary | ICD-10-CM | POA: Diagnosis not present

## 2021-05-18 DIAGNOSIS — G629 Polyneuropathy, unspecified: Secondary | ICD-10-CM | POA: Diagnosis not present

## 2021-05-18 DIAGNOSIS — M1990 Unspecified osteoarthritis, unspecified site: Secondary | ICD-10-CM | POA: Diagnosis not present

## 2021-05-18 DIAGNOSIS — Z981 Arthrodesis status: Secondary | ICD-10-CM | POA: Diagnosis not present

## 2021-05-18 DIAGNOSIS — M48061 Spinal stenosis, lumbar region without neurogenic claudication: Secondary | ICD-10-CM | POA: Diagnosis not present

## 2021-05-18 DIAGNOSIS — G40909 Epilepsy, unspecified, not intractable, without status epilepticus: Secondary | ICD-10-CM | POA: Diagnosis not present

## 2021-05-18 DIAGNOSIS — E876 Hypokalemia: Secondary | ICD-10-CM | POA: Diagnosis not present

## 2021-05-18 DIAGNOSIS — U071 COVID-19: Secondary | ICD-10-CM | POA: Diagnosis not present

## 2021-05-18 DIAGNOSIS — M069 Rheumatoid arthritis, unspecified: Secondary | ICD-10-CM | POA: Diagnosis not present

## 2021-05-18 DIAGNOSIS — F419 Anxiety disorder, unspecified: Secondary | ICD-10-CM | POA: Diagnosis not present

## 2021-05-18 DIAGNOSIS — D649 Anemia, unspecified: Secondary | ICD-10-CM | POA: Diagnosis not present

## 2021-05-18 DIAGNOSIS — K219 Gastro-esophageal reflux disease without esophagitis: Secondary | ICD-10-CM | POA: Diagnosis not present

## 2021-05-18 DIAGNOSIS — E8809 Other disorders of plasma-protein metabolism, not elsewhere classified: Secondary | ICD-10-CM | POA: Diagnosis not present

## 2021-05-19 DIAGNOSIS — D649 Anemia, unspecified: Secondary | ICD-10-CM | POA: Diagnosis not present

## 2021-05-19 DIAGNOSIS — J449 Chronic obstructive pulmonary disease, unspecified: Secondary | ICD-10-CM | POA: Diagnosis not present

## 2021-05-19 DIAGNOSIS — E876 Hypokalemia: Secondary | ICD-10-CM | POA: Diagnosis not present

## 2021-05-19 DIAGNOSIS — U071 COVID-19: Secondary | ICD-10-CM | POA: Diagnosis not present

## 2021-05-19 DIAGNOSIS — I2699 Other pulmonary embolism without acute cor pulmonale: Secondary | ICD-10-CM | POA: Diagnosis not present

## 2021-05-22 DIAGNOSIS — J449 Chronic obstructive pulmonary disease, unspecified: Secondary | ICD-10-CM | POA: Diagnosis not present

## 2021-05-22 DIAGNOSIS — D649 Anemia, unspecified: Secondary | ICD-10-CM | POA: Diagnosis not present

## 2021-05-22 DIAGNOSIS — E876 Hypokalemia: Secondary | ICD-10-CM | POA: Diagnosis not present

## 2021-05-22 DIAGNOSIS — U071 COVID-19: Secondary | ICD-10-CM | POA: Diagnosis not present

## 2021-05-22 DIAGNOSIS — I2699 Other pulmonary embolism without acute cor pulmonale: Secondary | ICD-10-CM | POA: Diagnosis not present

## 2021-05-25 DIAGNOSIS — F331 Major depressive disorder, recurrent, moderate: Secondary | ICD-10-CM | POA: Diagnosis not present

## 2021-05-25 DIAGNOSIS — F411 Generalized anxiety disorder: Secondary | ICD-10-CM | POA: Diagnosis not present

## 2021-05-25 DIAGNOSIS — G4700 Insomnia, unspecified: Secondary | ICD-10-CM | POA: Diagnosis not present

## 2021-05-28 DIAGNOSIS — E876 Hypokalemia: Secondary | ICD-10-CM | POA: Diagnosis not present

## 2021-05-28 DIAGNOSIS — I2699 Other pulmonary embolism without acute cor pulmonale: Secondary | ICD-10-CM | POA: Diagnosis not present

## 2021-05-28 DIAGNOSIS — U071 COVID-19: Secondary | ICD-10-CM | POA: Diagnosis not present

## 2021-05-28 DIAGNOSIS — D649 Anemia, unspecified: Secondary | ICD-10-CM | POA: Diagnosis not present

## 2021-05-28 DIAGNOSIS — J449 Chronic obstructive pulmonary disease, unspecified: Secondary | ICD-10-CM | POA: Diagnosis not present

## 2021-06-02 DIAGNOSIS — E876 Hypokalemia: Secondary | ICD-10-CM | POA: Diagnosis not present

## 2021-06-02 DIAGNOSIS — J449 Chronic obstructive pulmonary disease, unspecified: Secondary | ICD-10-CM | POA: Diagnosis not present

## 2021-06-02 DIAGNOSIS — D649 Anemia, unspecified: Secondary | ICD-10-CM | POA: Diagnosis not present

## 2021-06-02 DIAGNOSIS — U071 COVID-19: Secondary | ICD-10-CM | POA: Diagnosis not present

## 2021-06-02 DIAGNOSIS — I2699 Other pulmonary embolism without acute cor pulmonale: Secondary | ICD-10-CM | POA: Diagnosis not present

## 2021-06-10 DIAGNOSIS — J449 Chronic obstructive pulmonary disease, unspecified: Secondary | ICD-10-CM | POA: Diagnosis not present

## 2021-06-10 DIAGNOSIS — D649 Anemia, unspecified: Secondary | ICD-10-CM | POA: Diagnosis not present

## 2021-06-10 DIAGNOSIS — U071 COVID-19: Secondary | ICD-10-CM | POA: Diagnosis not present

## 2021-06-10 DIAGNOSIS — I2699 Other pulmonary embolism without acute cor pulmonale: Secondary | ICD-10-CM | POA: Diagnosis not present

## 2021-06-10 DIAGNOSIS — E876 Hypokalemia: Secondary | ICD-10-CM | POA: Diagnosis not present

## 2021-06-16 DIAGNOSIS — Z23 Encounter for immunization: Secondary | ICD-10-CM | POA: Diagnosis not present

## 2021-06-17 DIAGNOSIS — U071 COVID-19: Secondary | ICD-10-CM | POA: Diagnosis not present

## 2021-06-17 DIAGNOSIS — M48061 Spinal stenosis, lumbar region without neurogenic claudication: Secondary | ICD-10-CM | POA: Diagnosis not present

## 2021-06-17 DIAGNOSIS — I2699 Other pulmonary embolism without acute cor pulmonale: Secondary | ICD-10-CM | POA: Diagnosis not present

## 2021-06-17 DIAGNOSIS — E8809 Other disorders of plasma-protein metabolism, not elsewhere classified: Secondary | ICD-10-CM | POA: Diagnosis not present

## 2021-06-17 DIAGNOSIS — D649 Anemia, unspecified: Secondary | ICD-10-CM | POA: Diagnosis not present

## 2021-06-17 DIAGNOSIS — Z981 Arthrodesis status: Secondary | ICD-10-CM | POA: Diagnosis not present

## 2021-06-17 DIAGNOSIS — F32A Depression, unspecified: Secondary | ICD-10-CM | POA: Diagnosis not present

## 2021-06-17 DIAGNOSIS — Z8781 Personal history of (healed) traumatic fracture: Secondary | ICD-10-CM | POA: Diagnosis not present

## 2021-06-17 DIAGNOSIS — G629 Polyneuropathy, unspecified: Secondary | ICD-10-CM | POA: Diagnosis not present

## 2021-06-17 DIAGNOSIS — E876 Hypokalemia: Secondary | ICD-10-CM | POA: Diagnosis not present

## 2021-06-17 DIAGNOSIS — I10 Essential (primary) hypertension: Secondary | ICD-10-CM | POA: Diagnosis not present

## 2021-06-17 DIAGNOSIS — E785 Hyperlipidemia, unspecified: Secondary | ICD-10-CM | POA: Diagnosis not present

## 2021-06-17 DIAGNOSIS — F419 Anxiety disorder, unspecified: Secondary | ICD-10-CM | POA: Diagnosis not present

## 2021-06-17 DIAGNOSIS — M1990 Unspecified osteoarthritis, unspecified site: Secondary | ICD-10-CM | POA: Diagnosis not present

## 2021-06-17 DIAGNOSIS — Z87891 Personal history of nicotine dependence: Secondary | ICD-10-CM | POA: Diagnosis not present

## 2021-06-17 DIAGNOSIS — M069 Rheumatoid arthritis, unspecified: Secondary | ICD-10-CM | POA: Diagnosis not present

## 2021-06-17 DIAGNOSIS — G40909 Epilepsy, unspecified, not intractable, without status epilepticus: Secondary | ICD-10-CM | POA: Diagnosis not present

## 2021-06-17 DIAGNOSIS — J449 Chronic obstructive pulmonary disease, unspecified: Secondary | ICD-10-CM | POA: Diagnosis not present

## 2021-06-17 DIAGNOSIS — M329 Systemic lupus erythematosus, unspecified: Secondary | ICD-10-CM | POA: Diagnosis not present

## 2021-06-17 DIAGNOSIS — K219 Gastro-esophageal reflux disease without esophagitis: Secondary | ICD-10-CM | POA: Diagnosis not present

## 2021-06-18 DIAGNOSIS — D649 Anemia, unspecified: Secondary | ICD-10-CM | POA: Diagnosis not present

## 2021-06-18 DIAGNOSIS — J449 Chronic obstructive pulmonary disease, unspecified: Secondary | ICD-10-CM | POA: Diagnosis not present

## 2021-06-18 DIAGNOSIS — U071 COVID-19: Secondary | ICD-10-CM | POA: Diagnosis not present

## 2021-06-18 DIAGNOSIS — E876 Hypokalemia: Secondary | ICD-10-CM | POA: Diagnosis not present

## 2021-06-18 DIAGNOSIS — I2699 Other pulmonary embolism without acute cor pulmonale: Secondary | ICD-10-CM | POA: Diagnosis not present

## 2021-06-22 DIAGNOSIS — M159 Polyosteoarthritis, unspecified: Secondary | ICD-10-CM | POA: Diagnosis not present

## 2021-06-22 DIAGNOSIS — R1310 Dysphagia, unspecified: Secondary | ICD-10-CM | POA: Diagnosis not present

## 2021-06-22 DIAGNOSIS — I10 Essential (primary) hypertension: Secondary | ICD-10-CM | POA: Diagnosis not present

## 2021-06-22 DIAGNOSIS — F329 Major depressive disorder, single episode, unspecified: Secondary | ICD-10-CM | POA: Diagnosis not present

## 2021-06-23 DIAGNOSIS — U071 COVID-19: Secondary | ICD-10-CM | POA: Diagnosis not present

## 2021-06-23 DIAGNOSIS — J449 Chronic obstructive pulmonary disease, unspecified: Secondary | ICD-10-CM | POA: Diagnosis not present

## 2021-06-23 DIAGNOSIS — F331 Major depressive disorder, recurrent, moderate: Secondary | ICD-10-CM | POA: Diagnosis not present

## 2021-06-23 DIAGNOSIS — F411 Generalized anxiety disorder: Secondary | ICD-10-CM | POA: Diagnosis not present

## 2021-06-23 DIAGNOSIS — E876 Hypokalemia: Secondary | ICD-10-CM | POA: Diagnosis not present

## 2021-06-23 DIAGNOSIS — D649 Anemia, unspecified: Secondary | ICD-10-CM | POA: Diagnosis not present

## 2021-06-23 DIAGNOSIS — G4700 Insomnia, unspecified: Secondary | ICD-10-CM | POA: Diagnosis not present

## 2021-06-23 DIAGNOSIS — I2699 Other pulmonary embolism without acute cor pulmonale: Secondary | ICD-10-CM | POA: Diagnosis not present

## 2021-07-02 DIAGNOSIS — I2699 Other pulmonary embolism without acute cor pulmonale: Secondary | ICD-10-CM | POA: Diagnosis not present

## 2021-07-02 DIAGNOSIS — U071 COVID-19: Secondary | ICD-10-CM | POA: Diagnosis not present

## 2021-07-02 DIAGNOSIS — D649 Anemia, unspecified: Secondary | ICD-10-CM | POA: Diagnosis not present

## 2021-07-02 DIAGNOSIS — E876 Hypokalemia: Secondary | ICD-10-CM | POA: Diagnosis not present

## 2021-07-02 DIAGNOSIS — J449 Chronic obstructive pulmonary disease, unspecified: Secondary | ICD-10-CM | POA: Diagnosis not present

## 2021-07-05 DIAGNOSIS — I10 Essential (primary) hypertension: Secondary | ICD-10-CM | POA: Diagnosis not present

## 2021-07-05 DIAGNOSIS — F329 Major depressive disorder, single episode, unspecified: Secondary | ICD-10-CM | POA: Diagnosis not present

## 2021-07-05 DIAGNOSIS — E785 Hyperlipidemia, unspecified: Secondary | ICD-10-CM | POA: Diagnosis not present

## 2021-07-05 DIAGNOSIS — M159 Polyosteoarthritis, unspecified: Secondary | ICD-10-CM | POA: Diagnosis not present

## 2021-07-07 DIAGNOSIS — B351 Tinea unguium: Secondary | ICD-10-CM | POA: Diagnosis not present

## 2021-07-07 DIAGNOSIS — M79675 Pain in left toe(s): Secondary | ICD-10-CM | POA: Diagnosis not present

## 2021-07-07 DIAGNOSIS — L600 Ingrowing nail: Secondary | ICD-10-CM | POA: Diagnosis not present

## 2021-07-07 DIAGNOSIS — I70203 Unspecified atherosclerosis of native arteries of extremities, bilateral legs: Secondary | ICD-10-CM | POA: Diagnosis not present

## 2021-07-07 DIAGNOSIS — M79674 Pain in right toe(s): Secondary | ICD-10-CM | POA: Diagnosis not present

## 2021-07-07 DIAGNOSIS — L851 Acquired keratosis [keratoderma] palmaris et plantaris: Secondary | ICD-10-CM | POA: Diagnosis not present

## 2021-08-03 DIAGNOSIS — F331 Major depressive disorder, recurrent, moderate: Secondary | ICD-10-CM | POA: Diagnosis not present

## 2021-08-03 DIAGNOSIS — F411 Generalized anxiety disorder: Secondary | ICD-10-CM | POA: Diagnosis not present

## 2021-08-03 DIAGNOSIS — G4700 Insomnia, unspecified: Secondary | ICD-10-CM | POA: Diagnosis not present

## 2021-08-26 DIAGNOSIS — S93491A Sprain of other ligament of right ankle, initial encounter: Secondary | ICD-10-CM | POA: Diagnosis not present

## 2021-08-26 DIAGNOSIS — M25571 Pain in right ankle and joints of right foot: Secondary | ICD-10-CM | POA: Diagnosis not present
